# Patient Record
Sex: Female | Born: 1965 | Race: White | Hispanic: No | Smoking: Current every day smoker
Health system: Southern US, Community
[De-identification: ages and names within clinical notes are randomized; demographics above are authoritative.]

## PROBLEM LIST (undated history)

## (undated) DIAGNOSIS — K219 Gastro-esophageal reflux disease without esophagitis: Secondary | ICD-10-CM

## (undated) DIAGNOSIS — E119 Type 2 diabetes mellitus without complications: Secondary | ICD-10-CM

## (undated) DIAGNOSIS — R7303 Prediabetes: Secondary | ICD-10-CM

## (undated) DIAGNOSIS — I10 Essential (primary) hypertension: Secondary | ICD-10-CM

## (undated) DIAGNOSIS — I639 Cerebral infarction, unspecified: Secondary | ICD-10-CM

## (undated) DIAGNOSIS — E785 Hyperlipidemia, unspecified: Secondary | ICD-10-CM

## (undated) DIAGNOSIS — Z86718 Personal history of other venous thrombosis and embolism: Secondary | ICD-10-CM

## (undated) DIAGNOSIS — I739 Peripheral vascular disease, unspecified: Secondary | ICD-10-CM

## (undated) DIAGNOSIS — T7840XA Allergy, unspecified, initial encounter: Secondary | ICD-10-CM

## (undated) DIAGNOSIS — F419 Anxiety disorder, unspecified: Secondary | ICD-10-CM

## (undated) DIAGNOSIS — D649 Anemia, unspecified: Secondary | ICD-10-CM

## (undated) HISTORY — PX: FOOT SURGERY: SHX648

## (undated) HISTORY — DX: Anxiety disorder, unspecified: F41.9

## (undated) HISTORY — DX: Allergy, unspecified, initial encounter: T78.40XA

## (undated) HISTORY — DX: Gastro-esophageal reflux disease without esophagitis: K21.9

## (undated) HISTORY — DX: Peripheral vascular disease, unspecified: I73.9

## (undated) HISTORY — DX: Hyperlipidemia, unspecified: E78.5

## (undated) HISTORY — DX: Essential (primary) hypertension: I10

---

## 1997-11-17 HISTORY — PX: CHOLECYSTECTOMY: SHX55

## 2002-11-17 HISTORY — PX: OOPHORECTOMY: SHX86

## 2002-11-17 HISTORY — PX: ABDOMINAL HYSTERECTOMY: SHX81

## 2003-01-19 ENCOUNTER — Encounter: Admission: RE | Admit: 2003-01-19 | Discharge: 2003-01-19 | Payer: Self-pay | Admitting: Sports Medicine

## 2003-01-19 ENCOUNTER — Encounter: Payer: Self-pay | Admitting: Sports Medicine

## 2003-01-25 ENCOUNTER — Encounter: Payer: Self-pay | Admitting: Sports Medicine

## 2003-01-25 ENCOUNTER — Encounter: Admission: RE | Admit: 2003-01-25 | Discharge: 2003-01-25 | Payer: Self-pay | Admitting: Sports Medicine

## 2006-01-08 ENCOUNTER — Emergency Department: Payer: Self-pay | Admitting: Emergency Medicine

## 2006-01-08 ENCOUNTER — Other Ambulatory Visit: Payer: Self-pay

## 2006-04-02 ENCOUNTER — Ambulatory Visit: Payer: Self-pay | Admitting: Internal Medicine

## 2009-07-08 ENCOUNTER — Emergency Department: Payer: Self-pay | Admitting: Unknown Physician Specialty

## 2011-10-14 ENCOUNTER — Emergency Department: Payer: Self-pay | Admitting: Emergency Medicine

## 2011-11-18 HISTORY — PX: AORTIC ENDARTERECETOMY: SHX5724

## 2012-07-02 ENCOUNTER — Emergency Department: Payer: Self-pay | Admitting: *Deleted

## 2012-07-02 LAB — COMPREHENSIVE METABOLIC PANEL
Albumin: 4.1 g/dL (ref 3.4–5.0)
Alkaline Phosphatase: 112 U/L (ref 50–136)
Anion Gap: 6 — ABNORMAL LOW (ref 7–16)
BUN: 9 mg/dL (ref 7–18)
Chloride: 108 mmol/L — ABNORMAL HIGH (ref 98–107)
EGFR (Non-African Amer.): >60
Glucose: 85 mg/dL (ref 65–99)
Osmolality: 279 (ref 275–301)
Potassium: 3.9 mmol/L (ref 3.5–5.1)
SGOT(AST): 21 U/L (ref 15–37)
Sodium: 141 mmol/L (ref 136–145)
Total Protein: 8.6 g/dL — ABNORMAL HIGH (ref 6.4–8.2)

## 2012-07-02 LAB — CBC
HGB: 15.5 g/dL (ref 12.0–16.0)
MCH: 30.9 pg (ref 26.0–34.0)
MCHC: 33.4 g/dL (ref 32.0–36.0)
RBC: 5.01 10*6/uL (ref 3.80–5.20)
RDW: 13.3 % (ref 11.5–14.5)

## 2012-07-02 LAB — TROPONIN I: Troponin-I: <0.02 ng/mL

## 2012-11-17 DIAGNOSIS — Z86718 Personal history of other venous thrombosis and embolism: Secondary | ICD-10-CM

## 2012-11-17 HISTORY — DX: Personal history of other venous thrombosis and embolism: Z86.718

## 2012-11-21 ENCOUNTER — Emergency Department: Payer: Self-pay | Admitting: Emergency Medicine

## 2013-07-07 ENCOUNTER — Ambulatory Visit: Payer: Self-pay | Admitting: Family Medicine

## 2013-11-17 ENCOUNTER — Emergency Department: Payer: Self-pay | Admitting: Emergency Medicine

## 2013-11-17 LAB — CBC WITH DIFFERENTIAL/PLATELET
BASOS ABS: 0.1 10*3/uL (ref 0.0–0.1)
Basophil %: 1.4 %
EOS ABS: 0.4 10*3/uL (ref 0.0–0.7)
Eosinophil %: 4.3 %
HCT: 42.1 % (ref 35.0–47.0)
HGB: 14.5 g/dL (ref 12.0–16.0)
Lymphocyte #: 2.6 10*3/uL (ref 1.0–3.6)
Lymphocyte %: 32 %
MCH: 31 pg (ref 26.0–34.0)
MCHC: 34.4 g/dL (ref 32.0–36.0)
MCV: 90 fL (ref 80–100)
MONOS PCT: 9.7 %
Monocyte #: 0.8 x10 3/mm (ref 0.2–0.9)
Neutrophil #: 4.3 10*3/uL (ref 1.4–6.5)
Neutrophil %: 52.6 %
Platelet: 363 10*3/uL (ref 150–440)
RBC: 4.66 10*6/uL (ref 3.80–5.20)
RDW: 13.3 % (ref 11.5–14.5)
WBC: 8.2 10*3/uL (ref 3.6–11.0)

## 2013-11-17 LAB — COMPREHENSIVE METABOLIC PANEL
ALK PHOS: 99 U/L
ANION GAP: 4 — AB (ref 7–16)
AST: 23 U/L (ref 15–37)
Albumin: 3.5 g/dL (ref 3.4–5.0)
BILIRUBIN TOTAL: 0.3 mg/dL (ref 0.2–1.0)
BUN: 10 mg/dL (ref 7–18)
CO2: 27 mmol/L (ref 21–32)
CREATININE: 0.85 mg/dL (ref 0.60–1.30)
Calcium, Total: 9.1 mg/dL (ref 8.5–10.1)
Chloride: 107 mmol/L (ref 98–107)
GLUCOSE: 102 mg/dL — AB (ref 65–99)
Osmolality: 275 (ref 275–301)
Potassium: 4.1 mmol/L (ref 3.5–5.1)
SGPT (ALT): 29 U/L (ref 12–78)
SODIUM: 138 mmol/L (ref 136–145)
Total Protein: 7.4 g/dL (ref 6.4–8.2)

## 2013-11-17 LAB — URINALYSIS, COMPLETE
BILIRUBIN, UR: NEGATIVE
Bacteria: NONE SEEN
Blood: NEGATIVE
Glucose,UR: NEGATIVE mg/dL (ref 0–75)
Ketone: NEGATIVE
Leukocyte Esterase: NEGATIVE
NITRITE: NEGATIVE
PROTEIN: NEGATIVE
Ph: 5 (ref 4.5–8.0)
RBC,UR: 1 /HPF (ref 0–5)
Specific Gravity: 1.016 (ref 1.003–1.030)
Squamous Epithelial: 4
WBC UR: 1 /HPF (ref 0–5)

## 2013-11-17 LAB — LIPASE, BLOOD: LIPASE: 282 U/L (ref 73–393)

## 2013-11-17 LAB — TROPONIN I: Troponin-I: <0.02 ng/mL

## 2013-11-23 ENCOUNTER — Ambulatory Visit: Payer: Self-pay | Admitting: Family Medicine

## 2013-12-05 ENCOUNTER — Ambulatory Visit: Payer: Self-pay | Admitting: Gastroenterology

## 2013-12-08 LAB — PATHOLOGY REPORT

## 2014-07-27 ENCOUNTER — Encounter: Payer: Self-pay | Admitting: Podiatry

## 2014-07-27 ENCOUNTER — Ambulatory Visit (INDEPENDENT_AMBULATORY_CARE_PROVIDER_SITE_OTHER): Payer: 59 | Admitting: Podiatry

## 2014-07-27 ENCOUNTER — Ambulatory Visit (INDEPENDENT_AMBULATORY_CARE_PROVIDER_SITE_OTHER): Payer: 59

## 2014-07-27 VITALS — BP 109/64 | HR 86 | Resp 16 | Ht 62.0 in | Wt 178.0 lb

## 2014-07-27 DIAGNOSIS — M722 Plantar fascial fibromatosis: Secondary | ICD-10-CM

## 2014-07-27 MED ORDER — METHYLPREDNISOLONE (PAK) 4 MG PO TABS: ORAL_TABLET | ORAL | Status: DC

## 2014-07-27 MED ORDER — MELOXICAM 15 MG PO TABS: 15.0000 mg | ORAL_TABLET | Freq: Every day | ORAL | Status: DC

## 2014-07-27 NOTE — Patient Instructions (Signed)

## 2014-07-27 NOTE — Progress Notes (Signed)
   Subjective:    Patient ID: Courtney King, female    DOB: 1966/04/22, 48 y.o.   MRN: [id number]  HPI Comments: i have heel pain in both feet. The left one is worse. Ive had it for 2 months. Its getting worse. When i get up in the am it just about brings me down. The only time they dont hurt is when im not up on them. It hurts worse with flat shoes. i have taken tylenol, ice, rolls frozen bottle on feet, and stretches.  Foot Pain      Review of Systems  Hematological: Bruises/bleeds easily.  All other systems reviewed and are negative.      Objective:   Physical Exam: I have reviewed her past medical history medications allergies surgeries social history and review of systems. Pulses are strongly palpable bilateral. Neurologic sensorium is intact per since once the monofilament. Deep tendon reflexes are intact bilateral muscle strength is 5 over 5 dorsiflexors plantar flexors inverters everters all intrinsic musculature is intact. Orthopedic evaluation demonstrates all joints distal to the ankle a full range of motion without crepitation. She has pain on palpation medial continued tubercles bilateral. No pain on medial and lateral compression of the calcaneus. No calf pain. Radiographic evaluation demonstrates plantar distally oriented calcaneal heel spurs with soft tissue increase in density at the plantar fascial calcaneal insertion site.        Assessment & Plan:  Assessment: plantar fasciitis bilateral.  Plan: Injected the bilateral heels today with Kenalog and local anesthetic. Plantar fascial brace is were dispensed. 1 night splint was dispensed. Both oral and written home-going instructions were dispensed. A prescription for Medrol Dosepak to be followed by meloxicam dispensed.

## 2014-08-15 ENCOUNTER — Encounter: Payer: Self-pay | Admitting: Podiatry

## 2014-08-15 ENCOUNTER — Ambulatory Visit (INDEPENDENT_AMBULATORY_CARE_PROVIDER_SITE_OTHER): Payer: 59 | Admitting: Podiatry

## 2014-08-15 VITALS — BP 120/66 | HR 89 | Resp 16

## 2014-08-15 DIAGNOSIS — M722 Plantar fascial fibromatosis: Secondary | ICD-10-CM

## 2014-08-15 MED ORDER — TRIAMCINOLONE ACETONIDE 10 MG/ML IJ SUSP
10.0000 mg | Freq: Once | INTRAMUSCULAR | Status: AC
  Administered 2014-08-15: 10 mg

## 2014-08-15 NOTE — Progress Notes (Signed)
Subjective:     Patient ID: Courtney King, female   DOB: 1966/02/02, 48 y.o.   MRN: [id number]  HPI patient states my heels have started to hurt me quite a bit again and I cannot wait until next week to have injections done   Review of Systems     Objective:   Physical Exam Neurovascular status intact with discomfort in the plantar heel region of both heels upon palpation    Assessment:     Plantar fasciitis with inflammation heel of both feet    Plan:     Injected the plantar fascia of both feet 3 mg Kenalog 5 mg Xylocaine and instructed on physical therapy and supportive shoes and we'll see her physician at this office next week for check

## 2014-08-23 ENCOUNTER — Ambulatory Visit: Payer: 59 | Admitting: Podiatry

## 2014-11-28 ENCOUNTER — Ambulatory Visit (INDEPENDENT_AMBULATORY_CARE_PROVIDER_SITE_OTHER): Payer: 59 | Admitting: Podiatry

## 2014-11-28 ENCOUNTER — Encounter: Payer: Self-pay | Admitting: Podiatry

## 2014-11-28 VITALS — BP 126/77 | HR 96 | Resp 16

## 2014-11-28 DIAGNOSIS — M722 Plantar fascial fibromatosis: Secondary | ICD-10-CM

## 2014-11-28 MED ORDER — DICLOFENAC SODIUM 1 % TD GEL: 2.0000 g | Freq: Two times a day (BID) | TRANSDERMAL | Status: DC

## 2014-11-28 MED ORDER — TRIAMCINOLONE ACETONIDE 10 MG/ML IJ SUSP
10.0000 mg | Freq: Once | INTRAMUSCULAR | Status: AC
  Administered 2014-11-28: 10 mg

## 2014-11-28 NOTE — Patient Instructions (Signed)
Plantar Fasciitis (Heel Spur Syndrome) with Rehab The plantar fascia is a fibrous, ligament-like, soft-tissue structure that spans the bottom of the foot. Plantar fasciitis is a condition that causes pain in the foot due to inflammation of the tissue. SYMPTOMS   Pain and tenderness on the underneath side of the foot.  Pain that worsens with standing or walking. CAUSES  Plantar fasciitis is caused by irritation and injury to the plantar fascia on the underneath side of the foot. Common mechanisms of injury include:  Direct trauma to bottom of the foot.  Damage to a small nerve that runs under the foot where the main fascia attaches to the heel bone.  Stress placed on the plantar fascia due to bone spurs. RISK INCREASES WITH:   Activities that place stress on the plantar fascia (running, jumping, pivoting, or cutting).  Poor strength and flexibility.  Improperly fitted shoes.  Tight calf muscles.  Flat feet.  Failure to warm-up properly before activity.  Obesity. PREVENTION  Warm up and stretch properly before activity.  Allow for adequate recovery between workouts.  Maintain physical fitness:  Strength, flexibility, and endurance.  Cardiovascular fitness.  Maintain a health body weight.  Avoid stress on the plantar fascia.  Wear properly fitted shoes, including arch supports for individuals who have flat feet. PROGNOSIS  If treated properly, then the symptoms of plantar fasciitis usually resolve without surgery. However, occasionally surgery is necessary. RELATED COMPLICATIONS   Recurrent symptoms that may result in a chronic condition.  Problems of the lower back that are caused by compensating for the injury, such as limping.  Pain or weakness of the foot during push-off following surgery.  Chronic inflammation, scarring, and partial or complete fascia tear, occurring more often from repeated injections. TREATMENT  Treatment initially involves the use of  ice and medication to help reduce pain and inflammation. The use of strengthening and stretching exercises may help reduce pain with activity, especially stretches of the Achilles tendon. These exercises may be performed at home or with a therapist. Your caregiver may recommend that you use heel cups of arch supports to help reduce stress on the plantar fascia. Occasionally, corticosteroid injections are given to reduce inflammation. If symptoms persist for greater than 6 months despite non-surgical (conservative), then surgery may be recommended.  MEDICATION   If pain medication is necessary, then nonsteroidal anti-inflammatory medications, such as aspirin and ibuprofen, or other minor pain relievers, such as acetaminophen, are often recommended.  Do not take pain medication within 7 days before surgery.  Prescription pain relievers may be given if deemed necessary by your caregiver. Use only as directed and only as much as you need.  Corticosteroid injections may be given by your caregiver. These injections should be reserved for the most serious cases, because they may only be given a certain number of times. HEAT AND COLD  Cold treatment (icing) relieves pain and reduces inflammation. Cold treatment should be applied for 10 to 15 minutes every 2 to 3 hours for inflammation and pain and immediately after any activity that aggravates your symptoms. Use ice packs or massage the area with a piece of ice (ice massage).  Heat treatment may be used prior to performing the stretching and strengthening activities prescribed by your caregiver, physical therapist, or athletic trainer. Use a heat pack or soak the injury in warm water. SEEK IMMEDIATE MEDICAL CARE IF:  Treatment seems to offer no benefit, or the condition worsens.  Any medications produce adverse side effects. EXERCISES RANGE   OF MOTION (ROM) AND STRETCHING EXERCISES - Plantar Fasciitis (Heel Spur Syndrome) These exercises may help you  when beginning to rehabilitate your injury. Your symptoms may resolve with or without further involvement from your physician, physical therapist or athletic trainer. While completing these exercises, remember:   Restoring tissue flexibility helps normal motion to return to the joints. This allows healthier, less painful movement and activity.  An effective stretch should be held for at least 30 seconds.  A stretch should never be painful. You should only feel a gentle lengthening or release in the stretched tissue. RANGE OF MOTION - Toe Extension, Flexion  Sit with your right / left leg crossed over your opposite knee.  Grasp your toes and gently pull them back toward the top of your foot. You should feel a stretch on the bottom of your toes and/or foot.  Hold this stretch for __________ seconds.  Now, gently pull your toes toward the bottom of your foot. You should feel a stretch on the top of your toes and or foot.  Hold this stretch for __________ seconds. Repeat __________ times. Complete this stretch __________ times per day.  RANGE OF MOTION - Ankle Dorsiflexion, Active Assisted  Remove shoes and sit on a chair that is preferably not on a carpeted surface.  Place right / left foot under knee. Extend your opposite leg for support.  Keeping your heel down, slide your right / left foot back toward the chair until you feel a stretch at your ankle or calf. If you do not feel a stretch, slide your bottom forward to the edge of the chair, while still keeping your heel down.  Hold this stretch for __________ seconds. Repeat __________ times. Complete this stretch __________ times per day.  STRETCH - Gastroc, Standing  Place hands on wall.  Extend right / left leg, keeping the front knee somewhat bent.  Slightly point your toes inward on your back foot.  Keeping your right / left heel on the floor and your knee straight, shift your weight toward the wall, not allowing your back to  arch.  You should feel a gentle stretch in the right / left calf. Hold this position for __________ seconds. Repeat __________ times. Complete this stretch __________ times per day. STRETCH - Soleus, Standing  Place hands on wall.  Extend right / left leg, keeping the other knee somewhat bent.  Slightly point your toes inward on your back foot.  Keep your right / left heel on the floor, bend your back knee, and slightly shift your weight over the back leg so that you feel a gentle stretch deep in your back calf.  Hold this position for __________ seconds. Repeat __________ times. Complete this stretch __________ times per day. STRETCH - Gastrocsoleus, Standing  Note: This exercise can place a lot of stress on your foot and ankle. Please complete this exercise only if specifically instructed by your caregiver.   Place the ball of your right / left foot on a step, keeping your other foot firmly on the same step.  Hold on to the wall or a rail for balance.  Slowly lift your other foot, allowing your body weight to press your heel down over the edge of the step.  You should feel a stretch in your right / left calf.  Hold this position for __________ seconds.  Repeat this exercise with a slight bend in your right / left knee. Repeat __________ times. Complete this stretch __________ times per day.    STRENGTHENING EXERCISES - Plantar Fasciitis (Heel Spur Syndrome)  These exercises may help you when beginning to rehabilitate your injury. They may resolve your symptoms with or without further involvement from your physician, physical therapist or athletic trainer. While completing these exercises, remember:   Muscles can gain both the endurance and the strength needed for everyday activities through controlled exercises.  Complete these exercises as instructed by your physician, physical therapist or athletic trainer. Progress the resistance and repetitions only as guided. STRENGTH -  Towel Curls  Sit in a chair positioned on a non-carpeted surface.  Place your foot on a towel, keeping your heel on the floor.  Pull the towel toward your heel by only curling your toes. Keep your heel on the floor.  If instructed by your physician, physical therapist or athletic trainer, add ____________________ at the end of the towel. Repeat __________ times. Complete this exercise __________ times per day. STRENGTH - Ankle Inversion  Secure one end of a rubber exercise band/tubing to a fixed object (table, pole). Loop the other end around your foot just before your toes.  Place your fists between your knees. This will focus your strengthening at your ankle.  Slowly, pull your big toe up and in, making sure the band/tubing is positioned to resist the entire motion.  Hold this position for __________ seconds.  Have your muscles resist the band/tubing as it slowly pulls your foot back to the starting position. Repeat __________ times. Complete this exercises __________ times per day.  Document Released: 11/03/2005 Document Revised: 01/26/2012 Document Reviewed: 02/15/2009 [employer] Patient Information 2015 [employer]. This information is not intended to replace advice given to you by your health care provider. Make sure you discuss any questions you have with your health care provider.  

## 2014-11-28 NOTE — Progress Notes (Signed)
Patient ID: Courtney King, female   DOB: 12/29/1965, 49 y.o.   MRN: [id number]  Subjective: 49 year old female presents the office today requesting injections to bilateral heels for plantar fasciitis. She states that she previously has had to injection to the area which help. She states that approximately 3 weeks ago she has started to notice increase in symptoms to both of her heels. She states that she has started to use the meloxicam as well as stretching icing the area since the discomfort started. She continues to have mild discomfort overlying the area. She denies any recent injury or trauma to bilateral lower extremity. Also since last appointment she has purchased new balance shoes which help. She has not been using the night splint. Denies any recent swelling or redness around the area. Denies any systemic complaints such as fevers, chills, nausea, vomiting. No acute changes since last appointment, and no other complaints at this time.   Objective: AAO x3, NAD DP/PT pulses palpable bilaterally, CRT less than 3 seconds Protective sensation intact with Simms Weinstein monofilament, vibratory sensation intact, Achilles tendon reflex intact Tenderness palpation overlying the plantar medial tubercle of the calcaneus at the insertion of the plantar fascia bilaterally. There is no pain along the course of the plantar fascial in the arch of the foot in the ligament appears to be intact. No pain along the posterior aspect of the calcaneus or along the course of/insertion of the Achilles tendon. No pain with lateral compression of the calcaneus or pain with vibratory sensation bilaterally.  No areas of pinpoint bony tenderness or pain with vibratory sensation. MMT 5/5, ROM WNL. No edema, erythema, increase in warmth to bilateral lower extremities.  No open lesions or pre-ulcerative lesions.  No pain with calf compression, swelling, warmth, erythema  Assessment: 49 year old female with bilateral heel  pain, reoccurrence of plantar fasciitis.  Plan: -All treatment options discussed with the patient including all alternatives, risks, complications.  -Patient elects to proceed with steroid injection into the bilateral heels. Under sterile skin preparation, a total of 2.5cc of kenalog 10, 0.5% Marcaine plain, and 2% lidocaine plain were infiltrated into the symptomatic area without complication. A band-aid was applied. Patient tolerated the injection well without complication. Post-injection care with discussed with the patient. Discussed with the patient to ice the area over the next couple of days to help prevent a steroid flare.  -Dispensed plantar fascial braces as she has worn the other ones out from washing them. -Continue stretching exercises as well as icing. -Continue meloxicam. Discussed side effects the medication and directed to stop if any are to occur. She is not having problems so far. -Follow-up in 4 weeks or sooner should any problems arise. In the meantime encouraged to call the office with any questions, concerns, changes symptoms

## 2014-12-28 ENCOUNTER — Ambulatory Visit: Payer: 59 | Admitting: Podiatry

## 2015-01-04 ENCOUNTER — Ambulatory Visit (INDEPENDENT_AMBULATORY_CARE_PROVIDER_SITE_OTHER): Payer: 59 | Admitting: Podiatry

## 2015-01-04 ENCOUNTER — Encounter: Payer: Self-pay | Admitting: Podiatry

## 2015-01-04 VITALS — BP 152/82 | HR 96 | Resp 18

## 2015-01-04 DIAGNOSIS — M722 Plantar fascial fibromatosis: Secondary | ICD-10-CM

## 2015-01-04 NOTE — Patient Instructions (Signed)
Venous Thromboembolism, Prevention A venous thromboembolism is a blood clot that forms in a vein. A blood clot in a deep vein is called a deep venous thrombosis (DVT). A blood clot in the lungs is called a pulmonary embolism (PE). Blood clots are dangerous and can cause death. Blood clots can form in the:  Lungs.  Legs.  Arms. CAUSES  A blood clot can form in a vein from different conditions. A blood clot can develop due to:  Blood flow within a vein that is sluggish or very slow.  Medical conditions that make the blood clot easily.  Vein damage. RISK FACTORS Risk factors can increase your risk of developing a blood clot. Risk factors can include:  Smoking.  Obesity.  Age.  Immobility or sedentary lifestyle.  Sitting or standing for long periods of time.  Chronic or long-term bedrest.  Medical or past history of blood clots.  Family history of blood clots.  Hip, leg, or pelvis injury or trauma.  Major surgery, especially surgery on the hip, knee, or abdomen.  Pregnancy and childbirth.  Birth control pills and hormone replacement therapy.  Medical conditions such as  Peripheral vascular disease (PVD).  Diabetes.  Cancer. SYMPTOMS  Symptoms of VTE can depend on where the clot is located and if the clot breaks off and travels to another organ. Sometimes, there may be no symptoms.   DVT symptoms can include:  Swelling of the leg or arm, especially on one side.  Warmth and redness of the leg or arm, especially on one side.  Pain in an arm or leg. Leg pain may be more noticeable or worse when standing or walking.  PE symptoms can include:  Shortness of breath.  Coughing.  Coughing up blood or blood-tinged mucus (hemoptysis).  Chest pain or chest pain with deep breaths (pleuritic chest pain).  Apprehension, anxiety, or a feeling of impending doom.  Rapid heartbeat. PREVENTION  Exercise regularly. Take a brisk 30 minute walk every day. Staying  active and moving around can help prevent blood clots.  Avoid sitting or lying in bed for long periods of time. Change your position often, especially during a long trip.  Women, especially those over the age of 107, should consider the risks and benefits of taking estrogen medicines. This includes birth control pills and hormone replacement therapy.  Do not smoke, especially if you take estrogen medicines. If you smoke, talk to your caregiver on how to quit.  Eat plenty of fruits and vegetables. Ask your caregiver or dietitian if there are foods you should avoid.  Maintain a weight as suggested by your caregiver.  Wear loose-fitting clothing. Avoid constrictive or tight clothing around your legs or waist.  Try not to bump or injure your legs. Avoid crossing your legs when you are sitting.  Do not use pillows under your knees unless told by your caregiver.  Take all medicines that your caregiver prescribes you.  Wear special stockings (compression stockings or TED hose) if your caregiver prescribes them.  Wearing compression stockings (support hose) can make the leg veins more narrow. This increases blood flow in the legs and can help prevent blood clots.  It is important to wear compression stockings correctly. Do not let them bunch up when you are wearing them. TRAVEL Long distance travel can increase the risk of a blood clot. To prevent a blood clot when traveling:  You should exercise your legs by walking or by pumping your muscles every hour. To help prevent poor  circulation on long trips, stand, stretch, and walk up and down the aisle of your airplane, train, or bus as often as possible to get the blood moving.  Do squats if you are able. If you are unable to do squats, raise your foot on the balls of your feet and tighten your lower leg muscles (particularly the calve muscles) while seated. Pointing (flexing and extending) your toes while tightening your calves while seated are  also good exercises to do every hour during long trips. They help increase blood flow and reduce risk of DVT.  Stay well hydrated. Drink water regularly when traveling, especially when you are sitting or immobile for long periods of time.  Use of drugs to prevent DVT during routine travel is not generally recommended. Before taking any drugs to reduce risk of DVT, consult your caregiver. SURGERY AND HOSPITALIZATION  People who are at high risk for a blood clot may be given a blood thinning medicine (anticoagulant) when they are hospitalized even if they are not going to have surgery.  A long trip prior to surgery can increase the risk of a clot for patients undergoing hip and knee replacements. Talk to your caregiver about travel plans before your surgery.  After hip or knee surgery, your caregiver may give you anticoagulants to help prevent blood clots.  Anticoagulants may be given to people at high risk of developing thromboembolism, before, during, or sometimes after surgery, including people with clotting disorders or with a history of past thromboembolism. TRAVEL AFTER SURGERY  In orthopedic surgery, the cutting of bones prompts the body to increase clotting factors in the blood. Due to the size of the bones involved in hip and knee replacements, there is a higher risk of blood clotting than other orthopedic surgeries.  There is a risk of clotting for up to 4-6 weeks after surgery. Flying or traveling long distances can increase your risk of a clot. As a result, those who travel long distances may need additional preventive measures after their procedure.  Drink only non-alcoholic beverages during your flight, train, or car travel. Alcohol can dehydrate you and increase your risk of getting blood clots. SEEK IMMEDIATE MEDICAL CARE IF:   You develop chest pain.  You develop severe shortness of breath.  You have breathing problems after traveling.  You develop swelling or pain in the  leg.  You begin to cough up bloody mucus or phlegm (sputum).  You feel dizzy or faint. Document Released: 10/22/2009 Document Revised: 07/28/2012 Document Reviewed: 10/22/2009 [facility] Patient Information 2015 [place], [place]. This information is not intended to replace advice given to you by your health care provider. Make sure you discuss any questions you have with your health care provider.

## 2015-01-07 NOTE — Progress Notes (Signed)
Patient ID: Courtney King, female   DOB: 11-23-1965, 49 y.o.   MRN: [id number]  Subjective: 49 year old female returns the office today with complaints of bilateral heel pain with a left greater than right. She states that she has previously had multiple injections into the area which seem to help alleviate the symptoms however she continues to have pain to her heels. She states that she was talking to her friend who was placed in a hard cast for 6 weeks which relieved her symptoms and she is requesting this at this time. She states she has continue the stretching and icing exercises. No other complaints at this time in no acute changes since last appointment. Denies any systemic complaints as fevers, chills, nausea, vomiting.  Objective: AAO x3, NAD DP/PT pulses palpable bilaterally, CRT less than 3 seconds Protective sensation intact with Simms Weinstein monofilament, vibratory sensation intact, Achilles tendon reflex intact Tenderness to palpation overlying the plantar medial tubercle of the calcaneus to bilateral heels at the insertion of the plantar fascia with the left > right. There is no pain along the course of plantar fascial within the arch of the foot. The plantar fascia does appear to be intact. There is no pain with lateral compression of the calcaneus or pain the vibratory sensation. No pain on the posterior aspect of the calcaneus or along the course/insertion of the Achilles tendon. There is no overlying edema, erythema, increase in warmth. No other areas of tenderness palpation or pain with vibratory sensation to the foot/ankle bilaterally. MMT 5/5, ROM WNL No open lesions or pre-ulcerative lesions are identified. No pain with calf compression, swelling, warmth, erythema.  Assessment: 49 year old female with continued bilateral heel pain left greater than right.  Plan: -Treatment options were discussed with the patient clean alternatives, risks, complications. -At this time the  patient is requesting a fiberglass cast nonweightbearing for 6 weeks. I discussed with the patient that although this is an option for plantar fasciitis given her history of blood clot as well as smoking she is a high risk for developing DVT/PE. If she wanted to pursue this treatment she would need to be on DVT prophylaxis throughout the duration. I discussed with her a CAM boot in order for her to continue to walk as well as remove the boot to exercise/massage the area to help prevent blood clots. She is agreeable to this treatment. The CAM boot was discussed. I discussed with her great length signs and symptoms of DVT/PE. Any are to occur to go directly to the emergency room. -Continue with anti-inflammatory medication and ice. -Follow-up in 4 weeks or sooner if any problems are to arise. In the meantime, GERD to call the office with any questions, concerns, change in symptoms.

## 2015-02-01 ENCOUNTER — Encounter: Payer: Self-pay | Admitting: Podiatry

## 2015-02-01 ENCOUNTER — Ambulatory Visit (INDEPENDENT_AMBULATORY_CARE_PROVIDER_SITE_OTHER): Payer: Commercial Managed Care - PPO | Admitting: Podiatry

## 2015-02-01 VITALS — BP 114/62 | HR 93 | Resp 16

## 2015-02-01 DIAGNOSIS — M722 Plantar fascial fibromatosis: Secondary | ICD-10-CM | POA: Diagnosis not present

## 2015-02-01 NOTE — Progress Notes (Signed)
   Subjective:    Patient ID: Courtney King, female    DOB: 1966-11-10, 49 y.o.   MRN: [id number]  HPI 38 female presents the occipital evaluation of bilateral heel pain. She states of the right side is doing well and she has no pain to the right side. She states that she occasionally has some intermittent pain to the left heel although she states that stretching and icing alleviate some symptoms. She continues to wear the CAM walker intermittently however she does not wear it at work. She has been wearing a more supportive sneaker at work which seems to help alleviate his symptoms. No other complaints at this time. No acute changes since last appointment.   Review of Systems  All other systems reviewed and are negative.      Objective:   Physical Exam AAO x3, NAD DP/PT pulses palpable bilaterally, CRT less than 3 seconds Protective sensation intact with Simms Weinstein monofilament, vibratory sensation intact, Achilles tendon reflex intact Mild tenderness to palpation overlying the plantar medial tubercle of the calcaneus to right heel at the insertion of the plantar fascia. There is no pain along the course of plantar fascial within the arch of the foot bilaterally. There is no pain overlying the right heel insertion of the plantar fascia. There is no pain with lateral compression of the calcaneus or pain the vibratory sensation. No pain on the posterior aspect of the calcaneus or along the course/insertion of the Achilles tendon. There is no overlying edema, erythema, increase in warmth. No other areas of tenderness palpation or pain with vibratory sensation to the foot/ankle. MMT 5/5, ROM WNL No open lesions or pre-ulcerative lesions are identified. No pain with calf compression, swelling, warmth, erythema.       Assessment & Plan:  49 year old female with resolving right heel pain with intermittent left heel pain although resolving. -Treatment options were discussed including  alternatives, risks, complications. -At this time discussed to continue stretching, night splint, ice, anti-inflammatories. I discussed with her possible physical therapy however since she is doing well. I she wishes to continue with home treatment. Discussed with her that if the pain increases or if there is any further symptoms can reevaluate as needed. -A no was provided for her to wear supportive shoes while working. -Follow-up as needed. In the meantime encouraged to call the office with any questions, concerns, change in symptoms.

## 2015-03-07 ENCOUNTER — Telehealth: Payer: Self-pay | Admitting: *Deleted

## 2015-03-07 NOTE — Telephone Encounter (Signed)
Yes

## 2015-03-07 NOTE — Telephone Encounter (Signed)
Patient was given a air fracture walker , wants to know if she can have the short boot instead

## 2015-03-21 ENCOUNTER — Ambulatory Visit (INDEPENDENT_AMBULATORY_CARE_PROVIDER_SITE_OTHER): Payer: 59 | Admitting: Podiatry

## 2015-03-21 ENCOUNTER — Encounter: Payer: Self-pay | Admitting: Podiatry

## 2015-03-21 DIAGNOSIS — M722 Plantar fascial fibromatosis: Secondary | ICD-10-CM | POA: Diagnosis not present

## 2015-03-21 MED ORDER — TRAMADOL HCL 50 MG PO TABS: 50.0000 mg | ORAL_TABLET | Freq: Three times a day (TID) | ORAL | Status: DC | PRN

## 2015-03-21 NOTE — Patient Instructions (Signed)
Pre-Operative Instructions  Congratulations, you have decided to take an important step to improving your quality of life.  You can be assured that the doctors of [facility] will be with you every step of the way.  1. Plan to be at the surgery center/hospital at least 1 (one) hour prior to your scheduled time unless otherwise directed by the surgical center/hospital staff.  You must have a responsible adult accompany you, remain during the surgery and drive you home.  Make sure you have directions to the surgical center/hospital and know how to get there on time. 2. For hospital based surgery you will need to obtain a history and physical form from your family physician within 1 month prior to the date of surgery- we will give you a form for you primary physician.  3. We make every effort to accommodate the date you request for surgery.  There are however, times where surgery dates or times have to be moved.  We will contact you as soon as possible if a change in schedule is required.   4. No Aspirin/Ibuprofen for one week before surgery.  If you are on aspirin, any non-steroidal anti-inflammatory medications (Mobic, Aleve, Ibuprofen) you should stop taking it 7 days prior to your surgery.  You make take Tylenol  For pain prior to surgery.  5. Medications- If you are taking daily heart and blood pressure medications, seizure, reflux, allergy, asthma, anxiety, pain or diabetes medications, make sure the surgery center/hospital is aware before the day of surgery so they may notify you which medications to take or avoid the day of surgery. 6. No food or drink after midnight the night before surgery unless directed otherwise by surgical center/hospital staff. 7. No alcoholic beverages 24 hours prior to surgery.  No smoking 24 hours prior to or 24 hours after surgery. 8. Wear loose pants or shorts- loose enough to fit over bandages, boots, and casts. 9. No slip on shoes, sneakers are best. 10. Bring  your boot with you to the surgery center/hospital.  Also bring crutches or a walker if your physician has prescribed it for you.  If you do not have this equipment, it will be provided for you after surgery. 11. If you have not been contracted by the surgery center/hospital by the day before your surgery, call to confirm the date and time of your surgery. 12. Leave-time from work may vary depending on the type of surgery you have.  Appropriate arrangements should be made prior to surgery with your employer. 13. Prescriptions will be provided immediately following surgery by your doctor.  Have these filled as soon as possible after surgery and take the medication as directed. 14. Remove nail polish on the operative foot. 15. Wash the night before surgery.  The night before surgery wash the foot and leg well with the antibacterial soap provided and water paying special attention to beneath the toenails and in between the toes.  Rinse thoroughly with water and dry well with a towel.  Perform this wash unless told not to do so by your physician.  Enclosed: 1 Ice pack (please put in freezer the night before surgery)   1 Hibiclens skin cleaner   Pre-op Instructions  If you have any questions regarding the instructions, do not hesitate to call our office.  [place]: [address] [place], [place] [address] [phone]  [place]: [address] [place], [place] [address] [phone]  [place]: [address]  [place], [place] [address] [phone]  Dr. Richard   Tuchman DPM, Dr. Ila Mcgill DPM Dr. Harriet Masson DPM, Dr. Lanelle Bal DPM, Dr. Trudie Buckler DPM, Dr. Celesta Gentile, DPM

## 2015-03-22 ENCOUNTER — Ambulatory Visit: Payer: Commercial Managed Care - PPO | Admitting: Podiatry

## 2015-03-22 NOTE — Progress Notes (Signed)
Patient ID: Courtney King, female   DOB: [birth year], 49 y.o.   MRN: [id number]  Subjective: 49 year old female presents the office today for follow-up evaluation of bilateral heel pain. She states that her heels have been becoming more painful over the last 2 weeks. She says the left is worse than the right. At this time she has had multiple steroid injections, immobilization and she is continuing stretching, icing activities. At this time she is requesting a further treatment recommended performed for more definitive pain relief as is his been ongoing for greater than 1 year. She states the pain started mostly in the morning or after periods of activity which is relieved by ambulation was become more consistent. She denies any numbness or tingling. Denies any redness or swelling. Denies any acute changes since last appointment no other complaints at this time.   Objective: AAO x3, NAD DP/PT pulses palpable bilaterally, CRT less than 3 seconds Protective sensation intact with Simms Weinstein monofilament, vibratory sensation intact, Achilles tendon reflex intact, negative tinel sign bilaterally.  There is continued tenderness to palpation overlying the plantar medial tubercle of the calcaneus to bilateral heels at the insertion of the plantar fascia with the left > right. There is no pain along the course of plantar fascial within the arch of the foot and the plantar fascia appears intact. There is no pain with lateral compression of the calcaneus or pain the vibratory sensation. No pain on the posterior aspect of the calcaneus or along the course/insertion of the Achilles tendon. There is no overlying edema, erythema, increase in warmth. No other areas of tenderness palpation or pain with vibratory sensation to the foot/ankle. MMT 5/5, ROM WNL No open lesions or pre-ulcerative lesions are identified. No pain with calf compression, swelling, warmth, erythema.  Assessment: 49 year old female with  continued chronic heel pain bilaterally, plantar fasciitis of left greater than right  Plan: -Treatment options were discussed with the patient both conservative and surgical including alternatives, risks, competitions. At this time as she is attended conservative treatment I discussed with her EPAT and surgical interventions as well as other conservative treatment. At this time she elects to proceed with surgical intervention.  -The proposed surgery is left EPF vs. Open plantar fasciectomy  -The incision placement as well as the postoperative course was discussed with the patient. I discussed risks of the surgery which include, but not limited to, infection, bleeding, pain, swelling, need for further surgery, delayed or nonhealing, painful or ugly scar, numbness or sensation changes, over/under correction, recurrence, transfer lesions, further deformity, hardware failure, DVT/PE, loss of toe/foot. Patient understands these risks and wishes to proceed with surgery. The surgical consent was reviewed with the patient all 3 pages were signed. No promises or guarantees were given to the outcome of the procedure. All questions were answered to the best of my ability. Before the surgery the patient was encouraged to call the office if there is any further questions. The surgery will be performed at the [facility] on an outpatient basis.

## 2015-03-27 ENCOUNTER — Telehealth: Payer: Self-pay | Admitting: *Deleted

## 2015-03-27 NOTE — Telephone Encounter (Addendum)
I called to see if patient could reschedule her surgery to Thursday instead of Friday.  "That's fine but you know my FMLA date will need to be changed right?"  We will take care of that, no problem.  "What time will it be?"  I'm not sure but the surgical center will give you a call.  "Someone is going to call me with the time?"  Yes, someone will call you with the time.  I called and rescheduled surgery at [facility].  I called patient back because she had called inquiring about arrival time.  I informed her that she needs to tentatively be there at 6:45am.  They will call with a definite time.  "Okay, the anesthesiologist hasn't called me yet."  They normally call a day or 2 prior to surgery.  "Okay, thank you."  No, thank you.

## 2015-03-28 NOTE — Telephone Encounter (Signed)
Paperwork redone and resubmitted

## 2015-03-29 DIAGNOSIS — M722 Plantar fascial fibromatosis: Secondary | ICD-10-CM | POA: Diagnosis not present

## 2015-04-03 ENCOUNTER — Encounter: Payer: Self-pay | Admitting: Podiatry

## 2015-04-03 NOTE — Progress Notes (Signed)
epf left foot

## 2015-04-03 NOTE — Progress Notes (Signed)
   Subjective:    Patient ID: Courtney King, female    DOB: Oct 20, 1966, 49 y.o.   MRN: [id number]  HPI .    Review of Systems     Objective:   Physical Exam        Assessment & Plan:

## 2015-04-05 ENCOUNTER — Ambulatory Visit (INDEPENDENT_AMBULATORY_CARE_PROVIDER_SITE_OTHER): Payer: 59 | Admitting: Podiatry

## 2015-04-05 ENCOUNTER — Encounter: Payer: Self-pay | Admitting: Podiatry

## 2015-04-05 VITALS — BP 115/67 | HR 93 | Resp 16

## 2015-04-05 DIAGNOSIS — Z9889 Other specified postprocedural states: Secondary | ICD-10-CM

## 2015-04-05 DIAGNOSIS — M722 Plantar fascial fibromatosis: Secondary | ICD-10-CM

## 2015-04-05 NOTE — Progress Notes (Signed)
Patient ID: Courtney King, female   DOB: [birth year], 49 y.o.   MRN: [id number]  DOS: 03-30-15 L EPF  Subjective: 49 year old female presents the office today 1 week status post left foot AP F. She states that she does get some swelling to the area. She did try to go back to work and she had increased swelling which is resulting in some pain. She states that as the swelling increases she has discomfort. I 66 her foot elevated she does not have any pain. She's been taking the pain medication intermittently as needed. She denies any systemic complaints as fevers, chills, nausea, vomiting.  Denies any calf pain, chest pain, shortness of breath.She continues in the CAM walker. She does use her knee scooter at times. No other complaints at this time in no acute changes since last appointment.  Objective: AAO x 3, NAD DP/PT pulses palpable b/l, CRT < 3 sec  protective sensation intact with Simms Weinstein monofilament  Incisions of both the medial and lateral off of the heel well coapted without any evidence of dehiscence and sutures are intact. There is no swelling erythema, ascending cellulitis, drainage, malodor , or any other clinical signs of infection.  There is mild localized edema around the incision sites there is no significant edema to the foot/ankle/leg.There is no tenderness to palpation on the plantar aspect of the heel on the insertion of the plantar fascia. No other areas of pinpoint bony tenderness or pain the vibratory sensation of bilateral lower kidneys. No other open lesions or pre-ulcerative lesions identified. No pain with calf compression, swelling, warmth, erythema.  Assessment: 49 year old female 1 week s/p left EPF  Plan: -Teatment options were discussed including alternatives, risks, competitions -Dressings were removed. Antibiotic ointment was placed over the incisions followed by dry sterile dressing. Recommend he keep his dressing clean, dry, intact. -Continue with Cam  Walker. -Pain medication as needed -Ice and elevation -Follow-up in one week for likely suture removal or sooner if any problems are to arise. In the meantime, call the office with any questions, concerns, change in symptoms. Monitoring clinical signs or symptoms of infection and her DVT/PE and directed  To call the office immediately or go directly to the emergency room.

## 2015-04-06 ENCOUNTER — Encounter: Payer: 59 | Admitting: Podiatry

## 2015-04-06 ENCOUNTER — Encounter: Payer: Self-pay | Admitting: *Deleted

## 2015-04-06 NOTE — Progress Notes (Signed)
Faxed note for work to [phone] at patients request.

## 2015-04-06 NOTE — Progress Notes (Signed)
Faxed over work note at patient's request.

## 2015-04-10 ENCOUNTER — Telehealth: Payer: Self-pay | Admitting: *Deleted

## 2015-04-10 NOTE — Telephone Encounter (Signed)
CALLED PATIENT TO CHECK ON HER AFTER HER CALL TO DR Jacqualyn Posey LAST NIGHT ABOUT HER FOOT , HE HAD RECOMMENDED HER TO GO TO ER, PATIENT STATED THAT SHE DID NOT GO TO ER , THAT SHE PUT SOME WARM TOWELS ON HER FOOT AND IT WAS FINE AFTER THAT, SHE STATED SHE IS DOING WELL AND WILL JUST KEEP HER APPOINTMENT WITH [place] ON Thursday. SHE WILL CALL us IF SHE NEEDS [place]

## 2015-04-12 ENCOUNTER — Ambulatory Visit (INDEPENDENT_AMBULATORY_CARE_PROVIDER_SITE_OTHER): Payer: 59 | Admitting: Podiatry

## 2015-04-12 DIAGNOSIS — M722 Plantar fascial fibromatosis: Secondary | ICD-10-CM

## 2015-04-12 DIAGNOSIS — Z9889 Other specified postprocedural states: Secondary | ICD-10-CM

## 2015-04-16 NOTE — Progress Notes (Signed)
Patient ID: ALBIE BAZIN, female   DOB: 09/15/1966, 49 y.o.   MRN: [id number]  DOS: 03-30-15 L EPF  Subjective: [name] presents the office today 2 weeks status post left foot EPF. She does state that overall she's had some slight increase in pain this week and she has returned back to work. She also states that she does not wear the boot all the time and she does walk around the house barefoot. She did call earlier in the week stating that her foot with ice cold I encouraged her to go to the emergency room. She states that she do not in the emergency room she put a warm blanket on her foot which revealed the symptoms. At that time she had denied any color changes of the toe or any significant increase in pain. She denies any systemic complaints as fevers, chills, nausea, vomiting.  Denies any calf pain, chest pain, shortness of breath. No other complaints at this time in no acute changes since last appointment.  Objective: AAO x 3, NAD DP/PT pulses palpable b/l, CRT < 3 sec; normal color and temperature to bilateral lower extremities.  Protective sensation intact with Derrel Nip monofilament Incisions of both the medial and lateral aspect of the heel are all well coapted without any evidence of dehiscence and sutures are intact. There is no surrounding erythema, ascending cellulitis, drainage, malodor, or any other clinical signs of infection.  There is no edema around the incision sites. There is no tenderness to palpation on the plantar aspect of the heel on the insertion of the plantar fascia. No other areas of pinpoint bony tenderness or pain the vibratory sensation of bilateral lower extremities. No other open lesions or pre-ulcerative lesions identified. No pain with calf compression, swelling, warmth, erythema.  Assessment: 49 year old female 2 weeks s/p left EPF  Plan: -Teatment options were discussed including alternatives, risks, competitions -Dressings were removed. Sutures  were removed without complications.  Antibiotic ointment was placed over the incisions followed by dry sterile dressing. Recommend he keep his dressing clean, dry, intact. She can remove the dressing in 24 hours and start to shower unless there is a problem with the incision.  -Can start to transition to a regular, supportive sneaker. She MUST WEAR A SHOE AT ALL TIMES AND NOT GO BAREFOOT. Also, must wear either the night splint or CAM walker at night, every night.  -Hold off on any high impact activities/exercises.  -Pain medication as needed -Ice and elevation -Follow-up in 1 week (per patients request to be released to go back to work)  or sooner if any problems are to arise. In the meantime, call the office with any questions, concerns, change in symptoms. Monitoring clinical signs or symptoms of infection and her DVT/PE and directed to call the office immediately or go directly to the emergency room.

## 2015-04-19 ENCOUNTER — Encounter: Payer: Self-pay | Admitting: Podiatry

## 2015-04-19 ENCOUNTER — Ambulatory Visit (INDEPENDENT_AMBULATORY_CARE_PROVIDER_SITE_OTHER): Payer: 59 | Admitting: Podiatry

## 2015-04-19 DIAGNOSIS — M722 Plantar fascial fibromatosis: Secondary | ICD-10-CM

## 2015-04-19 DIAGNOSIS — Z9889 Other specified postprocedural states: Secondary | ICD-10-CM

## 2015-04-20 NOTE — Progress Notes (Signed)
Patient ID: Courtney King, female   DOB: [birth year], 49 y.o.   MRN: [id number]  DOS: 03-30-15 L EPF  Subjective: [name] presents the office today 3 weeks status post left foot EPF. She states that overall she was doing much better until yesterday when she mowed the grass she started developing increased pain. She does that she is able to angulate a regular shoe without much difficulty. She states that she does not have pain to reveal that she did prior to surgery. She gets some tenderness around the incisions however not to the bottom of her heel. She states that she's had no prompt the incision denies any drainage or redness. No other complaints at this time. She denies any systemic complaints as fevers, chills, nausea, vomiting.  Denies any calf pain, chest pain, shortness of breath. No other complaints at this time in no acute changes since last appointment.  Objective: AAO x 3, NAD DP/PT pulses palpable b/l, CRT < 3 sec Protective sensation intact with Simms Weinstein monofilament Incisions of both the medial and lateral aspect of the heel are all well coapted without any evidence of dehiscence. There is no surrounding erythema, ascending cellulitis, drainage, malodor, or any other clinical signs of infection.  There is no edema around the incision sites however there is mild tenderness over the medial incision. There is no tenderness to palpation on the plantar aspect of the heel on the insertion of the plantar fascia. No other areas of pinpoint bony tenderness or pain the vibratory sensation of bilateral lower extremities. No other open lesions or pre-ulcerative lesions identified. No pain with calf compression, swelling, warmth, erythema.  Assessment: 49 year old female 3 weeks s/p left EPF  Plan: -Teatment options were discussed including alternatives, risks, competitions -She can continue with finger shoe gear as tolerated. Again discussed that she must wear shoe at all times. Also  discussed that she needs to continue to wear the night splint at night every night for the next month. -Continue to hold off on any high-impact exercises or any aggressive activities such as mowing the grass. She understands this however due to personal reasons over the last week she's had increase activity. -Ice and elevation -At this time she can go back to work, however she still needs to limit her activity as much as possible. She can gradually increase activity as tolerated.  -Continue to monitor for any clinical signs or symptoms of infection and her DVT/PE and directed to call the office immediately or go directly to the emergency room. -Follow-up in 4 weeks, or sooner should any problems arise. In the meantime, encouraged to call with any questions/concerns/change in symptoms.

## 2015-05-17 ENCOUNTER — Encounter: Payer: 59 | Admitting: Podiatry

## 2015-05-29 ENCOUNTER — Other Ambulatory Visit: Payer: Self-pay | Admitting: Family Medicine

## 2015-05-29 NOTE — Telephone Encounter (Signed)
Patient is requesting a refill of Doxycycline she says her face has broken out and would like it sent to [facility].

## 2015-05-29 NOTE — Telephone Encounter (Signed)
States her face has broken out and is requesting refill on Doxycycline. Please send to [place] [facility].

## 2015-06-07 ENCOUNTER — Other Ambulatory Visit: Payer: Self-pay | Admitting: Family Medicine

## 2015-06-07 DIAGNOSIS — K219 Gastro-esophageal reflux disease without esophagitis: Secondary | ICD-10-CM

## 2015-06-07 DIAGNOSIS — I10 Essential (primary) hypertension: Secondary | ICD-10-CM

## 2015-07-17 ENCOUNTER — Ambulatory Visit (INDEPENDENT_AMBULATORY_CARE_PROVIDER_SITE_OTHER): Payer: 59 | Admitting: Family Medicine

## 2015-07-17 ENCOUNTER — Encounter: Payer: Self-pay | Admitting: Family Medicine

## 2015-07-17 VITALS — BP 122/66 | HR 103 | Temp 98.7°F | Resp 19 | Ht 61.0 in | Wt 193.6 lb

## 2015-07-17 DIAGNOSIS — Z6829 Body mass index (BMI) 29.0-29.9, adult: Secondary | ICD-10-CM | POA: Insufficient documentation

## 2015-07-17 DIAGNOSIS — K219 Gastro-esophageal reflux disease without esophagitis: Secondary | ICD-10-CM | POA: Insufficient documentation

## 2015-07-17 DIAGNOSIS — N951 Menopausal and female climacteric states: Secondary | ICD-10-CM | POA: Insufficient documentation

## 2015-07-17 DIAGNOSIS — F419 Anxiety disorder, unspecified: Secondary | ICD-10-CM | POA: Insufficient documentation

## 2015-07-17 DIAGNOSIS — E782 Mixed hyperlipidemia: Secondary | ICD-10-CM | POA: Diagnosis not present

## 2015-07-17 DIAGNOSIS — E785 Hyperlipidemia, unspecified: Secondary | ICD-10-CM | POA: Insufficient documentation

## 2015-07-17 DIAGNOSIS — IMO0001 Reserved for inherently not codable concepts without codable children: Secondary | ICD-10-CM

## 2015-07-17 MED ORDER — ICOSAPENT ETHYL 1 G PO CAPS: 2.0000 | ORAL_CAPSULE | Freq: Two times a day (BID) | ORAL | Status: DC

## 2015-07-17 NOTE — Progress Notes (Signed)
Name: Courtney King   MRN: [id number]    DOB: Jun 07, 1966   Date:07/17/2015       Progress Note  Subjective  Chief Complaint  Chief Complaint  Patient presents with  . Follow-up    BP  . Advice Only    Discuss labs for health screening  . Hyperlipidemia  . Gastrophageal Reflux  . Hypertension    Hyperlipidemia This is a new problem. Recent lipid tests were reviewed and are high. Exacerbating diseases include obesity. She has no history of hypothyroidism or liver disease. Pertinent negatives include no chest pain or shortness of breath. She is currently on no antihyperlipidemic treatment (She has taken Crestor in the past, which was stopped due to side effects).   Obesity Pt. Is here to discuss alternative to BMI wellness goals requested by her employer ([employer]) in order to have the discounted rate of insurance. She currently weighs at 193 lbs, with a BMI of 36.58. She has gained weight since she quit smoking on June 23 rd, 2016. She frequently eats fast foods during the week but cooks on the weekends.   Past Medical History  Diagnosis Date  . Allergy   . Hyperlipidemia   . Hypertension   . GERD (gastroesophageal reflux disease)   . Anxiety     Past Surgical History  Procedure Laterality Date  . Oophorectomy Bilateral 2004  . Cholecystectomy  1999  . Abdominal hysterectomy  2004    Family History  Problem Relation Age of Onset  . Cancer Mother     Social History   Social History  . Marital Status: Married    Spouse Name: N/A  . Number of Children: N/A  . Years of Education: N/A   Occupational History  . Not on file.   Social History Main Topics  . Smoking status: Former Research scientist (life sciences)  . Smokeless tobacco: Never Used  . Alcohol Use: 0.0 oz/week    0 Standard drinks or equivalent per week  . Drug Use: No  . Sexual Activity: Not on file   Other Topics Concern  . Not on file   Social History Narrative     Current outpatient prescriptions:  .  amoxicillin  (AMOXIL) 500 MG capsule, , Disp: , Rfl: 0 .  aspirin 81 MG tablet, Take by mouth., Disp: , Rfl:  .  aspirin EC 81 MG tablet, Take by mouth., Disp: , Rfl:  .  benzonatate (TESSALON) 100 MG capsule, Take by mouth., Disp: , Rfl:  .  clonazePAM (KLONOPIN) 0.5 MG tablet, Take by mouth., Disp: , Rfl:  .  clonazePAM (KLONOPIN) 0.5 MG tablet, Take by mouth., Disp: , Rfl:  .  diclofenac sodium (VOLTAREN) 1 % GEL, Apply 2 g topically 2 (two) times daily. Rub into affected area of foot 2 to 4 times daily, Disp: 100 g, Rfl: 2 .  diclofenac sodium (VOLTAREN) 1 % GEL, Apply topically., Disp: , Rfl:  .  lisinopril (PRINIVIL,ZESTRIL) 20 MG tablet, Take 1 tablet (20 mg total) by mouth daily., Disp: 90 tablet, Rfl: 0 .  meloxicam (MOBIC) 15 MG tablet, Take 1 tablet (15 mg total) by mouth daily., Disp: 30 tablet, Rfl: 3 .  methylPREDNIsolone (MEDROL DOSPACK) 4 MG tablet, follow package directions, Disp: 21 tablet, Rfl: 0 .  Omega-3 Fatty Acids (OMEGA 3 PO), Take by mouth daily., Disp: , Rfl:  .  oxyCODONE-acetaminophen (PERCOCET/ROXICET) 5-325 MG per tablet, Take by mouth., Disp: , Rfl:  .  pantoprazole (PROTONIX) 40 MG tablet, Take 1 tablet (  40 mg total) by mouth daily., Disp: 90 tablet, Rfl: 0 .  RABEprazole (ACIPHEX) 20 MG tablet, Take by mouth., Disp: , Rfl:  .  sucralfate (CARAFATE) 1 G tablet, Take by mouth., Disp: , Rfl:  .  sucralfate (CARAFATE) 1 G tablet, , Disp: , Rfl:  .  traMADol (ULTRAM) 50 MG tablet, Take 1 tablet (50 mg total) by mouth every 8 (eight) hours as needed., Disp: 30 tablet, Rfl: 0 .  venlafaxine (EFFEXOR) 37.5 MG tablet, Take by mouth., Disp: , Rfl:  .  venlafaxine (EFFEXOR) 37.5 MG tablet, Take by mouth., Disp: , Rfl:  .  venlafaxine XR (EFFEXOR-XR) 37.5 MG 24 hr capsule, , Disp: , Rfl:   Allergies  Allergen Reactions  . Codeine Other (See Comments) and Nausea And Vomiting    Other Reaction: Not Assessed Other Reaction: Not Assessed     Review of Systems  Respiratory:  Negative for shortness of breath.   Cardiovascular: Negative for chest pain and palpitations.  Gastrointestinal: Negative for abdominal pain.  Genitourinary: Negative for dysuria.     Objective  Filed Vitals:   07/17/15 1616  BP: 122/66  Pulse: 103  Temp: 98.7 F (37.1 C)  TempSrc: Oral  Resp: 19  Height: 5\' 1"  (1.549 m)  Weight: 193 lb 9.6 oz (87.816 kg)  SpO2: 97%    Physical Exam  Constitutional: She is well-developed, well-nourished, and in no distress.  HENT:  Left Ear: Hearing, tympanic membrane, external ear and ear canal normal.  Cardiovascular: Normal rate and regular rhythm.   Pulmonary/Chest: Effort normal and breath sounds normal.  Abdominal: Soft. Bowel sounds are normal.  Nursing note and vitals reviewed.   Assessment & Plan  1. Hypercholesterolemia with hypertriglyceridemia Lab work from health screening reviewed. Started patient on the Vascepa for hypertriglyceridemia. Patient has been intolerant to statin therapy on the past and she does not wish to try any other statin.  - TSH - Icosapent Ethyl (VASCEPA) 1 G CAPS; Take 2 capsules by mouth 2 (two) times daily.  Dispense: 120 capsule; Refill: 0  2. Obesity, Class II, BMI 35-39.9, with comorbidity  Referral to [facility] for management of obesity including dietary and lifestyle counseling. - Amb ref to Medical Nutrition Therapy-MNT   Deni Berti [facility] 07/17/2015 4:42 PM

## 2015-08-01 ENCOUNTER — Other Ambulatory Visit: Payer: Self-pay | Admitting: Family Medicine

## 2015-08-01 ENCOUNTER — Other Ambulatory Visit: Payer: Self-pay | Admitting: *Deleted

## 2015-08-01 MED ORDER — MELOXICAM 15 MG PO TABS
15.0000 mg | ORAL_TABLET | Freq: Every day | ORAL | Status: DC
Start: 2015-08-01 — End: 2017-04-03

## 2015-08-01 NOTE — Telephone Encounter (Signed)
Refilled meloxicam

## 2015-08-02 NOTE — Telephone Encounter (Signed)
Lisinopril 20 mg and Pantoprazole Sodium 40 mg has been refilled and sent to [facility]

## 2015-08-06 ENCOUNTER — Other Ambulatory Visit: Payer: Self-pay | Admitting: Family Medicine

## 2015-08-06 NOTE — Telephone Encounter (Signed)
[facility] sent 2nd request for refill.

## 2015-08-20 ENCOUNTER — Other Ambulatory Visit: Payer: Self-pay | Admitting: Family Medicine

## 2015-08-20 NOTE — Telephone Encounter (Signed)
Patient is requesting refill on controlled medications routed note to Dr. Manuella Ghazi for approval

## 2015-08-21 NOTE — Telephone Encounter (Signed)
Medication refill has been refused by Dr. Manuella Ghazi stating patient needs to schedule appointment

## 2015-09-03 ENCOUNTER — Encounter: Payer: Self-pay | Admitting: Family Medicine

## 2015-09-03 ENCOUNTER — Other Ambulatory Visit: Payer: Self-pay | Admitting: Family Medicine

## 2015-09-03 ENCOUNTER — Ambulatory Visit (INDEPENDENT_AMBULATORY_CARE_PROVIDER_SITE_OTHER): Payer: 59 | Admitting: Family Medicine

## 2015-09-03 VITALS — BP 144/85 | HR 93 | Temp 98.6°F | Resp 14 | Ht 62.0 in | Wt 185.2 lb

## 2015-09-03 DIAGNOSIS — H73002 Acute myringitis, left ear: Secondary | ICD-10-CM | POA: Diagnosis not present

## 2015-09-03 DIAGNOSIS — G4700 Insomnia, unspecified: Secondary | ICD-10-CM

## 2015-09-03 DIAGNOSIS — E669 Obesity, unspecified: Secondary | ICD-10-CM | POA: Insufficient documentation

## 2015-09-03 DIAGNOSIS — E781 Pure hyperglyceridemia: Secondary | ICD-10-CM | POA: Insufficient documentation

## 2015-09-03 DIAGNOSIS — J40 Bronchitis, not specified as acute or chronic: Secondary | ICD-10-CM | POA: Diagnosis not present

## 2015-09-03 MED ORDER — AZITHROMYCIN 250 MG PO TABS: ORAL_TABLET | ORAL | Status: DC

## 2015-09-03 NOTE — Telephone Encounter (Signed)
Patient as well as [facility] has been requesting a refill on Clonazepam 0.5mg  since 08/06/15. Patient last appointment was on 07/17/15 in which her medication was not filled during that appointment.  Patient stated that she is suppose to get a 90 day supply. Please call patient.

## 2015-09-03 NOTE — Progress Notes (Signed)
Date:  09/03/2015   Name:  Courtney King   DOB:  Nov 16, 1966   MRN:  [id number]  PCP:  Leata Mouse, NP    Chief Complaint: Cough   History of Present Illness:  This is a 49 y.o. female with 4d hx cough prod green phlegm, fever at night, slight sore throat, L sided headache and L ear pain.  Review of Systems:  Review of Systems  HENT: Negative for trouble swallowing.   Respiratory: Negative for shortness of breath and wheezing.     Patient Active Problem List   Diagnosis Date Noted  . Hypertriglyceridemia 09/03/2015  . Adiposity 09/03/2015  . Hypercholesterolemia with hypertriglyceridemia 07/17/2015  . Obesity, Class II, BMI 35-39.9, with comorbidity ([place]) 07/17/2015  . Anxiety 07/17/2015  . Dyslipidemia 07/17/2015  . Acid reflux 07/17/2015  . Hot flash, menopausal 07/17/2015    Prior to Admission medications   Medication Sig Start Date End Date Taking? Authorizing Provider  amoxicillin (AMOXIL) 500 MG capsule  12/07/14  Yes Historical Provider, MD  aspirin 81 MG tablet Take by mouth. 07/07/12  Yes Historical Provider, MD  aspirin EC 81 MG tablet Take by mouth.   Yes Historical Provider, MD  benzonatate (TESSALON) 100 MG capsule  08/30/15  Yes Historical Provider, MD  clonazePAM (KLONOPIN) 0.5 MG tablet Take by mouth.   Yes Historical Provider, MD  clonazePAM (KLONOPIN) 0.5 MG tablet Take by mouth.   Yes Historical Provider, MD  diclofenac sodium (VOLTAREN) 1 % GEL Apply 2 g topically 2 (two) times daily. Rub into affected area of foot 2 to 4 times daily 11/28/14  Yes Trula Slade, DPM  diclofenac sodium (VOLTAREN) 1 % GEL Apply topically. 11/28/14  Yes Historical Provider, MD  fluticasone (FLONASE) 50 MCG/ACT nasal spray Place into the nose. 08/30/15  Yes Historical Provider, MD  Icosapent Ethyl (VASCEPA) 1 G CAPS Take 2 capsules by mouth 2 (two) times daily. 07/17/15  Yes Roselee Nova, MD  lisinopril (PRINIVIL,ZESTRIL) 20 MG tablet Take 1 tablet (20 mg total) by mouth  daily. 08/02/15  Yes Roselee Nova, MD  meloxicam (MOBIC) 15 MG tablet Take 1 tablet (15 mg total) by mouth daily. 08/01/15  Yes Max T Hyatt, DPM  methylPREDNIsolone (MEDROL DOSPACK) 4 MG tablet follow package directions 07/27/14  Yes Max T Hyatt, DPM  Omega-3 Fatty Acids (OMEGA 3 PO) Take by mouth daily.   Yes Historical Provider, MD  oxyCODONE-acetaminophen (PERCOCET/ROXICET) 5-325 MG per tablet Take by mouth. 06/22/12  Yes Historical Provider, MD  pantoprazole (PROTONIX) 40 MG tablet Take 1 tablet (40 mg total) by mouth daily. 08/02/15  Yes Roselee Nova, MD  RABEprazole (ACIPHEX) 20 MG tablet Take by mouth.   Yes Historical Provider, MD  sucralfate (CARAFATE) 1 G tablet Take by mouth. 12/27/14  Yes Historical Provider, MD  sucralfate (CARAFATE) 1 G tablet  11/26/14  Yes Historical Provider, MD  traMADol (ULTRAM) 50 MG tablet Take 1 tablet (50 mg total) by mouth every 8 (eight) hours as needed. 03/21/15  Yes Trula Slade, DPM  venlafaxine (EFFEXOR) 37.5 MG tablet Take by mouth.   Yes Historical Provider, MD  venlafaxine (EFFEXOR) 37.5 MG tablet Take by mouth.   Yes Historical Provider, MD  venlafaxine XR (EFFEXOR-XR) 37.5 MG 24 hr capsule  12/07/14  Yes Historical Provider, MD  azithromycin (ZITHROMAX) 250 MG tablet Take two tablets today then one tablet daily for next four days 09/03/15   Adline Potter, MD  Allergies  Allergen Reactions  . Codeine Other (See Comments) and Nausea And Vomiting    Other Reaction: Not Assessed Other Reaction: Not Assessed    Past Surgical History  Procedure Laterality Date  . Oophorectomy Bilateral 2004  . Cholecystectomy  1999  . Abdominal hysterectomy  2004    Social History  Substance Use Topics  . Smoking status: Former Research scientist (life sciences)  . Smokeless tobacco: Never Used  . Alcohol Use: 0.0 oz/week    0 Standard drinks or equivalent per week    Family History  Problem Relation Age of Onset  . Cancer Mother     Medication list has been reviewed  and updated.  Physical Examination: BP 144/85 mmHg  Pulse 93  Temp(Src) 98.6 F (37 C) (Oral)  Resp 14  Ht 5\' 2"  (1.575 m)  Wt 185 lb 3.2 oz (84.006 kg)  BMI 33.86 kg/m2  SpO2 98%  Physical Exam  Constitutional: She appears well-developed and well-nourished.  HENT:  Mouth/Throat: No oropharyngeal exudate.  OP mild erythema R TM erythematous with small bullae  Cardiovascular: Normal rate, regular rhythm and normal heart sounds.   Pulmonary/Chest: Effort normal and breath sounds normal.  Lymphadenopathy:    She has no cervical adenopathy.  Nursing note and vitals reviewed.   Assessment and Plan:  1. Bronchitis Zpak, OTC cough syrup - azithromycin (ZITHROMAX) 250 MG tablet; Take two tablets today then one tablet daily for next four days  Dispense: 6 tablet; Refill: 0  2. Myringitis, acute, left Suspect mycoplasma, Zpak should cover   Return if symptoms worsen or fail to improve.  Satira Anis. [facility]  09/03/2015

## 2015-09-04 NOTE — Telephone Encounter (Signed)
Routed to Dr. Shah for approval 

## 2015-09-13 ENCOUNTER — Other Ambulatory Visit: Payer: Self-pay | Admitting: Family Medicine

## 2015-09-13 NOTE — Telephone Encounter (Signed)
Routed to Dr. Shah for approval 

## 2015-09-17 ENCOUNTER — Other Ambulatory Visit: Payer: Self-pay | Admitting: Emergency Medicine

## 2015-09-17 MED ORDER — CLONAZEPAM 0.5 MG PO TABS: 0.5000 mg | ORAL_TABLET | Freq: Every day | ORAL | Status: DC

## 2015-09-17 NOTE — Telephone Encounter (Signed)
Patient called need refill on Clonazepam. Did not get a script to last til next appointment. Script printed for Dr. Manuella Ghazi to sign and patient to pick up

## 2015-09-28 ENCOUNTER — Ambulatory Visit: Payer: 59 | Admitting: Family Medicine

## 2015-10-15 ENCOUNTER — Other Ambulatory Visit: Payer: Self-pay | Admitting: Family Medicine

## 2015-10-25 ENCOUNTER — Emergency Department
Admission: EM | Admit: 2015-10-25 | Discharge: 2015-10-25 | Disposition: A | Discharge status: Home / Self Care | Payer: 59 | Attending: Emergency Medicine | Admitting: Emergency Medicine

## 2015-10-25 ENCOUNTER — Emergency Department: Payer: 59

## 2015-10-25 ENCOUNTER — Encounter: Payer: Self-pay | Admitting: Emergency Medicine

## 2015-10-25 DIAGNOSIS — R0981 Nasal congestion: Secondary | ICD-10-CM | POA: Diagnosis not present

## 2015-10-25 DIAGNOSIS — R51 Headache: Secondary | ICD-10-CM | POA: Diagnosis not present

## 2015-10-25 DIAGNOSIS — R202 Paresthesia of skin: Secondary | ICD-10-CM

## 2015-10-25 DIAGNOSIS — I10 Essential (primary) hypertension: Secondary | ICD-10-CM | POA: Insufficient documentation

## 2015-10-25 DIAGNOSIS — R42 Dizziness and giddiness: Secondary | ICD-10-CM | POA: Diagnosis not present

## 2015-10-25 DIAGNOSIS — Z79899 Other long term (current) drug therapy: Secondary | ICD-10-CM | POA: Insufficient documentation

## 2015-10-25 DIAGNOSIS — Z87891 Personal history of nicotine dependence: Secondary | ICD-10-CM | POA: Insufficient documentation

## 2015-10-25 DIAGNOSIS — R000 Tachycardia, unspecified: Secondary | ICD-10-CM | POA: Insufficient documentation

## 2015-10-25 DIAGNOSIS — Z7982 Long term (current) use of aspirin: Secondary | ICD-10-CM | POA: Insufficient documentation

## 2015-10-25 DIAGNOSIS — R519 Headache, unspecified: Secondary | ICD-10-CM

## 2015-10-25 LAB — CBC
HCT: 41.7 % (ref 35.0–47.0)
HEMOGLOBIN: 14.4 g/dL (ref 12.0–16.0)
MCH: 31 pg (ref 26.0–34.0)
MCHC: 34.5 g/dL (ref 32.0–36.0)
MCV: 89.8 fL (ref 80.0–100.0)
PLATELETS: 318 10*3/uL (ref 150–440)
RBC: 4.64 MIL/uL (ref 3.80–5.20)
RDW: 13.6 % (ref 11.5–14.5)
WBC: 10.9 10*3/uL (ref 3.6–11.0)

## 2015-10-25 LAB — BASIC METABOLIC PANEL
Anion gap: 9 (ref 5–15)
BUN: 13 mg/dL (ref 6–20)
CO2: 26 mmol/L (ref 22–32)
CREATININE: 0.87 mg/dL (ref 0.44–1.00)
Calcium: 9.2 mg/dL (ref 8.9–10.3)
Chloride: 103 mmol/L (ref 101–111)
Glucose, Bld: 180 mg/dL — ABNORMAL HIGH (ref 65–99)
Potassium: 4 mmol/L (ref 3.5–5.1)
SODIUM: 138 mmol/L (ref 135–145)

## 2015-10-25 MED ORDER — SODIUM CHLORIDE 0.9 % IV BOLUS (SEPSIS)
1000.0000 mL | Freq: Once | INTRAVENOUS | Status: AC
Start: 2015-10-25 — End: 2015-10-25
  Administered 2015-10-25: 1000 mL via INTRAVENOUS

## 2015-10-25 MED ORDER — AZITHROMYCIN 250 MG PO TABS
500.0000 mg | ORAL_TABLET | Freq: Once | ORAL | Status: AC
  Administered 2015-10-25: 500 mg via ORAL
  Filled 2015-10-25: qty 2

## 2015-10-25 MED ORDER — AZITHROMYCIN 250 MG PO TABS: ORAL_TABLET | ORAL | Status: DC

## 2015-10-25 MED ORDER — KETOROLAC TROMETHAMINE 30 MG/ML IJ SOLN
30.0000 mg | Freq: Once | INTRAMUSCULAR | Status: AC
  Administered 2015-10-25: 30 mg via INTRAVENOUS
  Filled 2015-10-25: qty 1

## 2015-10-25 NOTE — ED Notes (Signed)
Pt c/o headache 5/10 that starts when she wakes up in the mornings, chills, and numbness x4 extremities when she's sleeping. Pt has no c/o numbness at this time.

## 2015-10-25 NOTE — ED Notes (Signed)
Discussed discharge instructions, prescriptions, and follow-up care with patient. No questions or concerns at this time. Pt stable at discharge.  

## 2015-10-25 NOTE — ED Notes (Signed)
Patient transported to CT 

## 2015-10-25 NOTE — ED Notes (Signed)
Pt states the past few days, she has been having a headache, htn, feels her heart is racing, and occasionally, all 4 limbs feel "numb" when she is laying down. Pt appears in no distress at this time. BP 141/65. Pt states she took her lisinopril last night.

## 2015-10-25 NOTE — ED Provider Notes (Signed)
[facility] Emergency Department Provider Note  ____________________________________________  Time seen: Approximately 1 PM  I have reviewed the triage vital signs and the nursing notes.   HISTORY  Chief Complaint Hypertension; Tachycardia; and Headache    HPI Courtney King is a 49 y.o. female with a history of aortic endarterectomy who is presenting today with a headache over the past week. She says the headache is worse in the mornings when she awakes. She says that what starts the headaches is when she rises from a lying down position when waking up. She says the bending over forward also makes her head hurt worse. She says that over the past 2 days she has experienced nasal congestion. Tried some Flonase at home which did not relieve her symptoms. She says that the headache is frontal. She denies any fever. Also says that she has lightheadedness in the mornings when she tries to stand up. She is also saying that during the nights when she wakes up in the middle the night that her arms and feet feel like they are asleep and did not have enough blood flow and she needs to shake him out. She denies any numbness or weakness at this time. Says at this time that her headache is a 6 out of 10. Describes the quality of the headache pain as pressure.  Past Medical History  Diagnosis Date  . Allergy   . Hyperlipidemia   . Hypertension   . GERD (gastroesophageal reflux disease)   . Anxiety     Patient Active Problem List   Diagnosis Date Noted  . Hypertriglyceridemia 09/03/2015  . Adiposity 09/03/2015  . Hypercholesterolemia with hypertriglyceridemia 07/17/2015  . Obesity, Class II, BMI 35-39.9, with comorbidity ([place]) 07/17/2015  . Anxiety 07/17/2015  . Dyslipidemia 07/17/2015  . Acid reflux 07/17/2015  . Hot flash, menopausal 07/17/2015    Past Surgical History  Procedure Laterality Date  . Oophorectomy Bilateral 2004  . Cholecystectomy  1999  .  Abdominal hysterectomy  2004    Current Outpatient Rx  Name  Route  Sig  Dispense  Refill  . aspirin EC 81 MG tablet   Oral   Take 81 mg by mouth at bedtime.         . clonazePAM (KLONOPIN) 0.5 MG tablet   Oral   Take 1 tablet (0.5 mg total) by mouth at bedtime.   30 tablet   0   . fluticasone (FLONASE) 50 MCG/ACT nasal spray   Nasal   Place 2 sprays into the nose daily as needed for allergies.          Vanessa Kick Ethyl (VASCEPA) 1 G CAPS   Oral   Take 2 capsules by mouth 2 (two) times daily. Patient taking differently: Take 1 capsule by mouth 2 (two) times daily.    120 capsule   0   . lisinopril (PRINIVIL,ZESTRIL) 20 MG tablet      Take 1 tablet by mouth  daily Patient taking differently: Take 1 tablet by mouth daily at bedtime   90 tablet   0   . meloxicam (MOBIC) 15 MG tablet   Oral   Take 1 tablet (15 mg total) by mouth daily. Patient taking differently: Take 15 mg by mouth daily as needed for pain.    30 tablet   3   . pantoprazole (PROTONIX) 40 MG tablet      Take 1 tablet by mouth  daily Patient taking differently: Take 1 tablet by mouth  daily at bedtime   90 tablet   0   . sucralfate (CARAFATE) 1 G tablet   Oral   Take 1 g by mouth 3 (three) times daily as needed (for severe reflux).          . venlafaxine XR (EFFEXOR-XR) 37.5 MG 24 hr capsule      Take 1 capsule by mouth  daily Patient taking differently: Take 1 capsule by mouth  daily at bedtime   90 capsule   0     Allergies Codeine  Family History  Problem Relation Age of Onset  . Cancer Mother     Social History Social History  Substance Use Topics  . Smoking status: Former Research scientist (life sciences)  . Smokeless tobacco: Never Used  . Alcohol Use: 0.0 oz/week    0 Standard drinks or equivalent per week    Review of Systems Constitutional: No fever/chills Eyes: No visual changes. ENT: No sore throat. Cardiovascular: Denies chest pain. Respiratory: Denies shortness of  breath. Gastrointestinal: No abdominal pain.  No nausea, no vomiting.  No diarrhea.  No constipation. Genitourinary: Negative for dysuria. Musculoskeletal: Negative for back pain. Skin: Negative for rash. Neurological: Negative for focal weakness  10-point ROS otherwise negative.  ____________________________________________   PHYSICAL EXAM:  VITAL SIGNS: ED Triage Vitals  Enc Vitals Group     BP 10/25/15 1036 141/65 mmHg     Pulse Rate 10/25/15 1036 114     Resp 10/25/15 1036 18     Temp 10/25/15 1036 98.7 F (37.1 C)     Temp Source 10/25/15 1036 Oral     SpO2 10/25/15 1036 96 %     Weight 10/25/15 1036 179 lb (81.194 kg)     Height 10/25/15 1036 5\' 1"  (1.549 m)     Head Cir --      Peak Flow --      Pain Score 10/25/15 1037 8     Pain Loc --      Pain Edu? --      Excl. in [place]? --     Constitutional: Alert and oriented. Well appearing and in no acute distress. Eyes: Conjunctivae are normal. PERRL. EOMI. Head: Atraumatic. Tenderness palpation to the frontal sinus as well as left maxillary sinus. Nose: Erythematous and swollen turbinates bilaterally. Mouth/Throat: Mucous membranes are moist.  Oropharynx non-erythematous. Neck: No stridor.   Cardiovascular: Tachycardic, regular rhythm. Grossly normal heart sounds.  Good peripheral circulation. Bilateral and equal radial as well as dorsalis pedis pulses. Respiratory: Normal respiratory effort.  No retractions. Lungs CTAB. Gastrointestinal: Soft and nontender. No distention. No abdominal bruits. No CVA tenderness. Musculoskeletal: No lower extremity tenderness nor edema.  No joint effusions. Neurologic:  Normal speech and language. No gross focal neurologic deficits are appreciated. No gait instability. Skin:  Skin is warm, dry and intact. No rash noted. Psychiatric: Mood and affect are normal. Speech and behavior are normal.  ____________________________________________   LABS (all labs ordered are listed, but only  abnormal results are displayed)  Labs Reviewed  BASIC METABOLIC PANEL - Abnormal; Notable for the following:    Glucose, Bld 180 (*)    All other components within normal limits  CBC   ____________________________________________  EKG  ED ECG REPORT I, Schaevitz,  Youlanda Roys, the attending physician, personally viewed and interpreted this ECG.   Date: 10/25/2015  EKG Time: 1028  Rate: 1:15  Rhythm: normal EKG, normal sinus rhythm, unchanged from previous tracings, sinus tachycardia  Axis: Rightward axis  Intervals:none  ST&T Change: No ST segment elevation or depression. No abnormal T-wave inversions.  ____________________________________________  RADIOLOGY  Normal head CT ____________________________________________   PROCEDURES    ____________________________________________   INITIAL IMPRESSION / ASSESSMENT AND PLAN / ED COURSE  Pertinent labs & imaging results that were available during my care of the patient were reviewed by me and considered in my medical decision making (see chart for details).  ----------------------------------------- 3:41 PM on 10/25/2015 ----------------------------------------- Patient without any distress this time. When patient breathing calmly and resting in bed her heart rate is anywhere from 94-97 bpm. She says her headache pain is improved to about a 4 out of 10. Given her constellation of symptoms with nasal congestion as well as frontal headache I believe this to be likely related to sinus pressure. The issue of her upper or lower extremities going numb at night when she is asleep is slightly more curious. I discussed with her further testing such as an MRV to rule out a cerebral venous thrombosis. However, the patient says that she would like to go home at this time and not undergo any further testing. I'll prescribe for her azithromycin and recommended decongestants for her nasal issues. She understands the plan is going to comply. She  understands that she also may return at any time for further testing and treatment. I feel that CVA is a less likely diagnosis secondary to the symptoms ongoing for 1 week with a normal CAT scan of the brain. Also the numbness is bilateral. There is also no associated weakness.   ____________________________________________   FINAL CLINICAL IMPRESSION(S) / ED DIAGNOSES  Headache. Nasal congestion. Paresthesia.    Orbie Pyo, MD 10/25/15 [phone]

## 2015-11-02 ENCOUNTER — Encounter: Payer: Self-pay | Admitting: Family Medicine

## 2015-11-02 ENCOUNTER — Ambulatory Visit (INDEPENDENT_AMBULATORY_CARE_PROVIDER_SITE_OTHER): Payer: 59 | Admitting: Family Medicine

## 2015-11-02 VITALS — BP 130/77 | HR 97 | Temp 98.2°F | Resp 16 | Ht 62.0 in | Wt 187.8 lb

## 2015-11-02 DIAGNOSIS — Z9889 Other specified postprocedural states: Secondary | ICD-10-CM

## 2015-11-02 DIAGNOSIS — IMO0001 Reserved for inherently not codable concepts without codable children: Secondary | ICD-10-CM

## 2015-11-02 DIAGNOSIS — E785 Hyperlipidemia, unspecified: Secondary | ICD-10-CM | POA: Diagnosis not present

## 2015-11-02 DIAGNOSIS — K219 Gastro-esophageal reflux disease without esophagitis: Secondary | ICD-10-CM

## 2015-11-02 DIAGNOSIS — I10 Essential (primary) hypertension: Secondary | ICD-10-CM | POA: Diagnosis not present

## 2015-11-02 DIAGNOSIS — N951 Menopausal and female climacteric states: Secondary | ICD-10-CM

## 2015-11-02 DIAGNOSIS — R519 Headache, unspecified: Secondary | ICD-10-CM

## 2015-11-02 DIAGNOSIS — R51 Headache: Secondary | ICD-10-CM | POA: Diagnosis not present

## 2015-11-02 MED ORDER — PANTOPRAZOLE SODIUM 40 MG PO TBEC: 40.0000 mg | DELAYED_RELEASE_TABLET | Freq: Two times a day (BID) | ORAL | Status: DC

## 2015-11-02 MED ORDER — OXYMETAZOLINE HCL 0.05 % NA SOLN: 1.0000 | Freq: Two times a day (BID) | NASAL | Status: DC

## 2015-11-02 MED ORDER — CLONAZEPAM 0.5 MG PO TABS: 0.5000 mg | ORAL_TABLET | Freq: Every day | ORAL | Status: DC

## 2015-11-02 NOTE — Patient Instructions (Signed)
HA: Let's try a decongestant for your sinus HA. Maybe this will help. You can use Afrin OTC for 3 days and 3 days only to help with congestion. This will help with your ears.  You can use Tylenol Sinus OTC for congestion but use sparingly due to your blood pressure.   If the HA persist, please keep a headache diary- write down when it occurs, any foods, stress, changes to environment etc.   We will check your lab work and talk more about your cholesterol from there.   Acid reflux: Try protonix twice daily.

## 2015-11-02 NOTE — Assessment & Plan Note (Signed)
Pt not currently on statin. Given history of endarterectomy- pt should likely be on statin therapy. Check lipids today.  Pt on aspirin for primary prevention.

## 2015-11-02 NOTE — Assessment & Plan Note (Signed)
Continue venlafaxine 

## 2015-11-02 NOTE — Assessment & Plan Note (Signed)
Encouraged 123minutes of exercise per week to promote weight loss.

## 2015-11-02 NOTE — Progress Notes (Signed)
Subjective:    Patient ID: Courtney King, female    DOB: [birth year], 49 y.o.   MRN: [id number]  HPI: Courtney King is a 49 y.o. female presenting on 11/02/2015 for Establish Care   HPI  Pt presents to establish care today- seen for sick visit by Dr. Vicente Masson a few months ago. Previous care provider was Dr. Manuella Ghazi at [place]  It has been 3 months since Her last PCP visit. Records from previous provider will be requested and reviewed. Current medical problems include:  Anxiety: Takes clonazepam every evening to help with sleep. Been on this medication for 3 years. 0.5mg    Hot flashes: Takes effexor for hot flashes. Working well.  High cholesterol: Taking fish oil OTC. High cholesterol- states she has never tried a statin she is concerned about them. 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  GERD: Well controlled with Protonix 40mg - sometimes throws up acid in the morning. Recently for gastritis.  Pre-diabetes: Last A1c was 5.8% Pt was unaware she had.  Hypertension: Lisinopril 20mg  daily. 130-150/75-88. No HA or visual changes. Denies CP or SOB.   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 Thought to be a sinus HA. Taking excedrin migraine for it with good relief. Finishing up Zpak for sinus infection. Still have congestion  Health maintenance:  Last mammo: 2014- due this year. Card given Pap: unsure- had total hysterectomy. For bleeding.  Unsure last tetanus.    Past Medical History  Diagnosis Date  . Allergy   . Hyperlipidemia   . Hypertension   . GERD (gastroesophageal reflux disease)   . Anxiety   . Left leg claudication [facility])     Current Outpatient Prescriptions on File Prior to Visit  Medication Sig  . aspirin EC 81 MG tablet Take 81 mg by mouth at bedtime.  . fluticasone (FLONASE) 50 MCG/ACT nasal spray Place 2 sprays into the nose daily as needed for allergies.   Vanessa Kick Ethyl (VASCEPA) 1 G CAPS Take  2 capsules by mouth 2 (two) times daily. (Patient taking differently: Take 1 capsule by mouth 2 (two) times daily. )  . lisinopril (PRINIVIL,ZESTRIL) 20 MG tablet Take 1 tablet by mouth  daily (Patient taking differently: Take 1 tablet by mouth daily at bedtime)  . meloxicam (MOBIC) 15 MG tablet Take 1 tablet (15 mg total) by mouth daily. (Patient taking differently: Take 15 mg by mouth as needed for pain. )  . sucralfate (CARAFATE) 1 G tablet Take 1 g by mouth 3 (three) times daily as needed (for severe reflux).   . venlafaxine XR (EFFEXOR-XR) 37.5 MG 24 hr capsule Take 1 capsule by mouth  daily (Patient taking differently: Take 1 capsule by mouth  daily at bedtime)   No current facility-administered medications on file prior to visit.    Review of Systems  Constitutional: Negative for fever and chills.  HENT: Positive for congestion and ear pain.   Respiratory: Negative for cough, chest tightness and wheezing.   Cardiovascular: Negative for chest pain and leg swelling.  Gastrointestinal: Negative for nausea, vomiting, abdominal pain, diarrhea and constipation.  Endocrine: Negative.  Negative for cold intolerance, heat intolerance, polydipsia, polyphagia and polyuria.  Genitourinary: Negative for dysuria and difficulty urinating.  Musculoskeletal: Negative.   Neurological: Positive for headaches. Negative for dizziness, light-headedness and numbness.  Psychiatric/Behavioral: Negative.    Per HPI unless specifically indicated above     Objective:  BP 130/77 mmHg  Pulse 97  Temp(Src) 98.2 F (36.8 C) (Oral)  Resp 16  Ht 5\' 2"  (1.575 m)  Wt 187 lb 12.8 oz (85.186 kg)  BMI 34.34 kg/m2  Wt Readings from Last 3 Encounters:  11/02/15 187 lb 12.8 oz (85.186 kg)  10/25/15 179 lb (81.194 kg)  09/03/15 185 lb 3.2 oz (84.006 kg)    Physical Exam  Constitutional: She is oriented to person, place, and time. She appears well-developed and well-nourished.  HENT:  Head: Normocephalic and  atraumatic.  Right Ear: Hearing and tympanic membrane normal.  Left Ear: Hearing normal. Tympanic membrane is retracted.  Nose: No mucosal edema or rhinorrhea. Right sinus exhibits frontal sinus tenderness. Left sinus exhibits frontal sinus tenderness.  Mouth/Throat: Oropharynx is clear and moist and mucous membranes are normal. No posterior oropharyngeal erythema.  Neck: Normal range of motion. Neck supple.  Cardiovascular: Normal rate, regular rhythm and normal heart sounds.  Exam reveals no gallop and no friction rub.   No murmur heard. Pulmonary/Chest: Effort normal and breath sounds normal. She has no wheezes. She exhibits no tenderness.  Abdominal: Soft. Normal appearance and bowel sounds are normal. She exhibits no distension and no mass. There is no tenderness. There is no rebound and no guarding.  Musculoskeletal: Normal range of motion. She exhibits no edema or tenderness.  Lymphadenopathy:    She has no cervical adenopathy.  Neurological: She is alert and oriented to person, place, and time.  Skin: Skin is warm and dry.   Results for orders placed or performed during the hospital encounter of 10/25/15  CBC  Result Value Ref Range   WBC 10.9 3.6 - 11.0 K/uL   RBC 4.64 3.80 - 5.20 MIL/uL   Hemoglobin 14.4 12.0 - 16.0 g/dL   HCT 41.7 35.0 - 47.0 %   MCV 89.8 80.0 - 100.0 fL   MCH 31.0 26.0 - 34.0 pg   MCHC 34.5 32.0 - 36.0 g/dL   RDW 13.6 11.5 - 14.5 %   Platelets 318 150 - 440 K/uL  Basic metabolic panel  Result Value Ref Range   Sodium 138 135 - 145 mmol/L   Potassium 4.0 3.5 - 5.1 mmol/L   Chloride 103 101 - 111 mmol/L   CO2 26 22 - 32 mmol/L   Glucose, Bld 180 (H) 65 - 99 mg/dL   BUN 13 6 - 20 mg/dL   Creatinine, Ser 0.87 0.44 - 1.00 mg/dL   Calcium 9.2 8.9 - 10.3 mg/dL   GFR calc non Af Amer >60 >60 mL/min   GFR calc Af Amer >60 >60 mL/min   Anion gap 9 5 - 15      Assessment & Plan:   Problem List Items Addressed This Visit      Cardiovascular and  Mediastinum   Hypertension    Controlled with lisinopril. Encouraged DASH diet. Check BP regularly. CMET today.         Digestive   Acid reflux    Increase Protonix to BID to help with symptom management. Consider GI referral if regurg and vomiting continue.  Consider PRN Zantac.       Relevant Medications   pantoprazole (PROTONIX) 40 MG tablet   Other Relevant Orders   CBC with Differential     Other   Obesity, Class II, BMI 35-39.9, with comorbidity ([place])    Encouraged 1108minutes of exercise per week to promote weight loss.       Dyslipidemia    Pt not currently  on statin. Given history of endarterectomy- pt should likely be on statin therapy. Check lipids today.  Pt on aspirin for primary prevention.       Relevant Orders   Lipid Profile   Comprehensive Metabolic Panel (CMET)   Hot flash, menopausal    Continue venlafaxine.       Relevant Orders   Comprehensive Metabolic Panel (CMET)   Hx of endarterectomy - Primary    Pt is not on a statin. Unclear if she has regular vascular follow-up. Check lipids.        Other Visit Diagnoses    Sinus headache        Afrin for congestion with comorbid hypertension. Excedrin as needed for HA. Keep HA diary if HA continues.     Relevant Medications    oxymetazoline (AFRIN NASAL SPRAY) 0.05 % nasal spray    clonazePAM (KLONOPIN) 0.5 MG tablet       Meds ordered this encounter  Medications  . oxymetazoline (AFRIN NASAL SPRAY) 0.05 % nasal spray    Sig: Place 1 spray into both nostrils 2 (two) times daily.    Dispense:  30 mL    Refill:  0    Order Specific Question:  Supervising Provider    Answer:  Arlis Porta [id number]  . pantoprazole (PROTONIX) 40 MG tablet    Sig: Take 1 tablet (40 mg total) by mouth 2 (two) times daily.    Dispense:  180 tablet    Refill:  0    Order Specific Question:  Supervising Provider    Answer:  Arlis Porta [phone]  . clonazePAM (KLONOPIN) 0.5 MG tablet    Sig: Take 1  tablet (0.5 mg total) by mouth at bedtime.    Dispense:  30 tablet    Refill:  0    Order Specific Question:  Supervising Provider    Answer:  Arlis Porta [id number]      Follow up plan: Return in about 4 weeks (around 11/30/2015) for GERD.

## 2015-11-02 NOTE — Assessment & Plan Note (Signed)
Pt is not on a statin. Unclear if she has regular vascular follow-up. Check lipids.

## 2015-11-02 NOTE — Assessment & Plan Note (Signed)
Controlled with lisinopril. Encouraged DASH diet. Check BP regularly. CMET today.

## 2015-11-02 NOTE — Assessment & Plan Note (Signed)
Increase Protonix to BID to help with symptom management. Consider GI referral if regurg and vomiting continue.  Consider PRN Zantac.

## 2015-12-04 ENCOUNTER — Telehealth: Payer: Self-pay | Admitting: Family Medicine

## 2015-12-04 DIAGNOSIS — R7303 Prediabetes: Secondary | ICD-10-CM

## 2015-12-04 LAB — COMPREHENSIVE METABOLIC PANEL
A/G RATIO: 1.3 (ref 1.1–2.5)
ALBUMIN: 3.8 g/dL (ref 3.5–5.5)
ALK PHOS: 89 IU/L (ref 39–117)
ALT: 20 IU/L (ref 0–32)
AST: 14 IU/L (ref 0–40)
BUN / CREAT RATIO: 14 (ref 9–23)
BUN: 13 mg/dL (ref 6–24)
Bilirubin Total: 0.3 mg/dL (ref 0.0–1.2)
CO2: 23 mmol/L (ref 18–29)
CREATININE: 0.91 mg/dL (ref 0.57–1.00)
Calcium: 9.7 mg/dL (ref 8.7–10.2)
Chloride: 101 mmol/L (ref 96–106)
GFR calc Af Amer: 86 mL/min/{1.73_m2} (ref >59)
GFR, EST NON AFRICAN AMERICAN: 74 mL/min/{1.73_m2} (ref >59)
GLOBULIN, TOTAL: 3 g/dL (ref 1.5–4.5)
Glucose: 119 mg/dL — ABNORMAL HIGH (ref 65–99)
Potassium: 5 mmol/L (ref 3.5–5.2)
SODIUM: 141 mmol/L (ref 134–144)
Total Protein: 6.8 g/dL (ref 6.0–8.5)

## 2015-12-04 LAB — LIPID PANEL
CHOLESTEROL TOTAL: 176 mg/dL (ref 100–199)
Chol/HDL Ratio: 5.2 ratio units — ABNORMAL HIGH (ref 0.0–4.4)
HDL: 34 mg/dL — AB (ref >39)
LDL CALC: 102 mg/dL — AB (ref 0–99)
TRIGLYCERIDES: 198 mg/dL — AB (ref 0–149)
VLDL Cholesterol Cal: 40 mg/dL (ref 5–40)

## 2015-12-04 LAB — CBC WITH DIFFERENTIAL/PLATELET
BASOS: 1 %
Basophils Absolute: 0.1 10*3/uL (ref 0.0–0.2)
EOS (ABSOLUTE): 0.3 10*3/uL (ref 0.0–0.4)
EOS: 4 %
HEMATOCRIT: 41.9 % (ref 34.0–46.6)
Hemoglobin: 14.2 g/dL (ref 11.1–15.9)
Immature Grans (Abs): 0 10*3/uL (ref 0.0–0.1)
Immature Granulocytes: 0 %
LYMPHS ABS: 2.8 10*3/uL (ref 0.7–3.1)
Lymphs: 38 %
MCH: 30.5 pg (ref 26.6–33.0)
MCHC: 33.9 g/dL (ref 31.5–35.7)
MCV: 90 fL (ref 79–97)
MONOS ABS: 0.8 10*3/uL (ref 0.1–0.9)
Monocytes: 10 %
Neutrophils Absolute: 3.5 10*3/uL (ref 1.4–7.0)
Neutrophils: 47 %
Platelets: 399 10*3/uL — ABNORMAL HIGH (ref 150–379)
RBC: 4.65 x10E6/uL (ref 3.77–5.28)
RDW: 13.4 % (ref 12.3–15.4)
WBC: 7.5 10*3/uL (ref 3.4–10.8)

## 2015-12-04 NOTE — Telephone Encounter (Signed)
Called [facility] to add on HgA1c.

## 2015-12-06 LAB — HGB A1C W/O EAG: HEMOGLOBIN A1C: 6.4 % — AB (ref 4.8–5.6)

## 2015-12-06 LAB — SPECIMEN STATUS REPORT

## 2015-12-07 ENCOUNTER — Encounter: Payer: Self-pay | Admitting: Family Medicine

## 2015-12-07 ENCOUNTER — Telehealth: Payer: Self-pay

## 2015-12-07 ENCOUNTER — Other Ambulatory Visit: Payer: Self-pay

## 2015-12-07 ENCOUNTER — Ambulatory Visit (INDEPENDENT_AMBULATORY_CARE_PROVIDER_SITE_OTHER): Payer: 59 | Admitting: Family Medicine

## 2015-12-07 VITALS — BP 119/75 | HR 92 | Temp 98.9°F | Resp 16 | Ht 62.0 in | Wt 185.2 lb

## 2015-12-07 DIAGNOSIS — K219 Gastro-esophageal reflux disease without esophagitis: Secondary | ICD-10-CM

## 2015-12-07 DIAGNOSIS — Z8041 Family history of malignant neoplasm of ovary: Secondary | ICD-10-CM | POA: Diagnosis not present

## 2015-12-07 DIAGNOSIS — F419 Anxiety disorder, unspecified: Secondary | ICD-10-CM | POA: Diagnosis not present

## 2015-12-07 DIAGNOSIS — Z800 Family history of malignant neoplasm of digestive organs: Secondary | ICD-10-CM | POA: Diagnosis not present

## 2015-12-07 DIAGNOSIS — R7303 Prediabetes: Secondary | ICD-10-CM

## 2015-12-07 MED ORDER — METFORMIN HCL ER (OSM) 500 MG PO TB24: 500.0000 mg | ORAL_TABLET | Freq: Every day | ORAL | Status: DC

## 2015-12-07 MED ORDER — CLONAZEPAM 0.5 MG PO TABS: 0.5000 mg | ORAL_TABLET | Freq: Every day | ORAL | Status: DC

## 2015-12-07 NOTE — Telephone Encounter (Signed)
Gastroenterology Pre-Procedure Review  Request Date:  Requesting Physician: Dr.   PATIENT REVIEW QUESTIONS: The patient responded to the following health history questions as indicated:    1. Are you having any GI issues? no 2. Do you have a personal history of Polyps? no 3. Do you have a family history of Colon Cancer or Polyps? no 4. Diabetes Mellitus? yes (Prediabetes) 5. Joint replacements in the past 12 months?no 6. Major health problems in the past 3 months? No 7. Any artificial heart valves, MVP, or defibrillator?yes (Aortic blockage 2013)    MEDICATIONS & ALLERGIES:    Patient reports the following regarding taking any anticoagulation/antiplatelet therapy:   Plavix, Coumadin, Eliquis, Xarelto, Lovenox, Pradaxa, Brilinta, or Effient? no Aspirin? yes (ASA 81mg )  Patient confirms/reports the following medications:  Current Outpatient Prescriptions  Medication Sig Dispense Refill  . aspirin EC 81 MG tablet Take 81 mg by mouth at bedtime.    . clonazePAM (KLONOPIN) 0.5 MG tablet Take 1 tablet (0.5 mg total) by mouth at bedtime. 90 tablet 0  . etodolac (LODINE) 500 MG tablet Take by mouth.    . fluticasone (FLONASE) 50 MCG/ACT nasal spray Place 2 sprays into the nose daily as needed for allergies.     Vanessa Kick Ethyl (VASCEPA) 1 G CAPS Take 2 capsules by mouth 2 (two) times daily. (Patient taking differently: Take 1 capsule by mouth 2 (two) times daily. ) 120 capsule 0  . lisinopril (PRINIVIL,ZESTRIL) 20 MG tablet Take 1 tablet by mouth  daily (Patient taking differently: Take 1 tablet by mouth daily at bedtime) 90 tablet 0  . meloxicam (MOBIC) 15 MG tablet Take 1 tablet (15 mg total) by mouth daily. (Patient taking differently: Take 15 mg by mouth as needed for pain. ) 30 tablet 3  . metformin (FORTAMET) 500 MG (OSM) 24 hr tablet Take 1 tablet (500 mg total) by mouth daily with breakfast. 90 tablet 3  . oxymetazoline (AFRIN NASAL SPRAY) 0.05 % nasal spray Place 1 spray into both  nostrils 2 (two) times daily. 30 mL 0  . pantoprazole (PROTONIX) 40 MG tablet Take 1 tablet (40 mg total) by mouth 2 (two) times daily. 180 tablet 0  . RABEprazole (ACIPHEX) 20 MG tablet Take by mouth.    . sucralfate (CARAFATE) 1 G tablet Take 1 g by mouth 3 (three) times daily as needed (for severe reflux).     . venlafaxine XR (EFFEXOR-XR) 37.5 MG 24 hr capsule Take 1 capsule by mouth  daily (Patient taking differently: Take 1 capsule by mouth  daily at bedtime) 90 capsule 0   No current facility-administered medications for this visit.    Patient confirms/reports the following allergies:  Allergies  Allergen Reactions  . Codeine Other (See Comments), Nausea And Vomiting and Nausea Only    Other Reaction: Not Assessed Other Reaction: Not Assessed    No orders of the defined types were placed in this encounter.    AUTHORIZATION INFORMATION Primary Insurance: 1D#: Group #:  Secondary Insurance: 1D#: Group #:  SCHEDULE INFORMATION: Date: 12/28/15 Time: Location: [place]

## 2015-12-07 NOTE — Assessment & Plan Note (Signed)
A1c today is 6.4%- start metformin once daily to help reduce A1c and with weight loss. Pt will enter [facility] program to help prevent diabetes. Continue to reduce sodas.  Encouraged increasing exercise, reducing breads, rice, pasta.  Recheck 3 mos.

## 2015-12-07 NOTE — Assessment & Plan Note (Signed)
Refer to GI for colon cancer screening if insurance will cover based on family history.

## 2015-12-07 NOTE — Assessment & Plan Note (Signed)
Clonazepam renewed today.

## 2015-12-07 NOTE — Patient Instructions (Signed)
Prediabetes: Please send any paperwork [facility] needs for your diabetes program over to the office. I think this would be helpful. I sent Metformin to [facility]- take it once daily.  This should help with weight loss and reducing your sugars.

## 2015-12-07 NOTE — Assessment & Plan Note (Signed)
Continue protonix as it seems to be controlling her symptoms. Advised to avoid dietary triggers.

## 2015-12-07 NOTE — Assessment & Plan Note (Signed)
Give strong family history- BrCAssure testing ordered today. Have discussed having pt see a genetics counselor to talk about her risk. Encouraged yearly mammograms to screen for breast cancer.  I spent >20 minutes face to face counseling about hereditary cancers and increased risk for cancer given family history.

## 2015-12-07 NOTE — Progress Notes (Signed)
Subjective:    Patient ID: Courtney King, female    DOB: [birth year], 50 y.o.   MRN: [id number]  HPI: Courtney King is a 49 y.o. female presenting on 12/07/2015 for Gastroesophageal Reflux   HPI  Pt presents for acid reflux follow-up.  Symptoms resolved with medication. No regurgitation, throwing up blood, or dysphagia.  Prediabetes: Last A1c was 5.7%. Has given up soda. Is trying to exercise regularly. [facility] has a program for prediabetes. She will look into doing that at her office. Anxiety: Taking clonazepam once daily at bedtime. Has been on for many years.Recently more anxious due to Mom's cancer diagnosis.  Requesting refill today.  Mom recently diagnosed with ovarian cancer- it is metastatic. This is mother's second round of ovarian cancer Aunt has colon cancer- diagnosed age 58. They are doing genetic testing. Other died at 3 of ovarian cancer. Ovarian and colon cancers run in the family. She is concerned about her risks. Had a hysterectomy and ovarian removal at age 50.  She is concerned about increased risk for breast and colon cancers. Is desirous of genetic testing.  She would also like to screen for colon cancer via colonscopy.    Past Medical History  Diagnosis Date  . Allergy   . Hyperlipidemia   . Hypertension   . GERD (gastroesophageal reflux disease)   . Anxiety   . Left leg claudication [facility])     Current Outpatient Prescriptions on File Prior to Visit  Medication Sig  . aspirin EC 81 MG tablet Take 81 mg by mouth at bedtime.  . fluticasone (FLONASE) 50 MCG/ACT nasal spray Place 2 sprays into the nose daily as needed for allergies.   Vanessa Kick Ethyl (VASCEPA) 1 G CAPS Take 2 capsules by mouth 2 (two) times daily. (Patient taking differently: Take 1 capsule by mouth 2 (two) times daily. )  . lisinopril (PRINIVIL,ZESTRIL) 20 MG tablet Take 1 tablet by mouth  daily (Patient taking differently: Take 1 tablet by mouth daily at bedtime)  . meloxicam (MOBIC) 15 MG  tablet Take 1 tablet (15 mg total) by mouth daily. (Patient taking differently: Take 15 mg by mouth as needed for pain. )  . oxymetazoline (AFRIN NASAL SPRAY) 0.05 % nasal spray Place 1 spray into both nostrils 2 (two) times daily.  . pantoprazole (PROTONIX) 40 MG tablet Take 1 tablet (40 mg total) by mouth 2 (two) times daily.  . sucralfate (CARAFATE) 1 G tablet Take 1 g by mouth 3 (three) times daily as needed (for severe reflux).   . venlafaxine XR (EFFEXOR-XR) 37.5 MG 24 hr capsule Take 1 capsule by mouth  daily (Patient taking differently: Take 1 capsule by mouth  daily at bedtime)   No current facility-administered medications on file prior to visit.    Review of Systems  Constitutional: Negative for fever and chills.  HENT: Negative.   Respiratory: Negative for cough, chest tightness and wheezing.   Cardiovascular: Negative for chest pain and leg swelling.  Gastrointestinal: Negative for nausea, vomiting, abdominal pain, diarrhea and constipation.  Endocrine: Negative.  Negative for cold intolerance, heat intolerance, polydipsia, polyphagia and polyuria.  Genitourinary: Negative for dysuria and difficulty urinating.  Musculoskeletal: Negative.   Neurological: Negative for dizziness, light-headedness and numbness.  Psychiatric/Behavioral: The patient is nervous/anxious.    Per HPI unless specifically indicated above     Objective:    BP 119/75 mmHg  Pulse 92  Temp(Src) 98.9 F (37.2 C) (Oral)  Resp 16  Ht 5\' 2"  (  1.575 m)  Wt 185 lb 3.2 oz (84.006 kg)  BMI 33.86 kg/m2  Wt Readings from Last 3 Encounters:  12/07/15 185 lb 3.2 oz (84.006 kg)  11/02/15 187 lb 12.8 oz (85.186 kg)  10/25/15 179 lb (81.194 kg)    Depression screen [facility] 2/9 12/07/2015 11/02/2015 07/17/2015  Decreased Interest 0 0 0  Down, Depressed, Hopeless 0 0 0  PHQ - 2 Score 0 0 0  Altered sleeping 3 - -  Tired, decreased energy 1 - -  Change in appetite 0 - -  Feeling bad or failure about yourself  0 - -   Trouble concentrating 0 - -  Moving slowly or fidgety/restless 0 - -  Suicidal thoughts 0 - -  PHQ-9 Score 4 - -  Difficult doing work/chores Somewhat difficult - -     Physical Exam  Constitutional: She is oriented to person, place, and time. She appears well-developed and well-nourished.  HENT:  Head: Normocephalic and atraumatic.  Neck: Neck supple.  Cardiovascular: Normal rate, regular rhythm and normal heart sounds.  Exam reveals no gallop and no friction rub.   No murmur heard. Pulmonary/Chest: Effort normal and breath sounds normal. She has no wheezes. She exhibits no tenderness.  Abdominal: Soft. Normal appearance and bowel sounds are normal. She exhibits no distension and no mass. There is no tenderness. There is no rebound and no guarding.  Musculoskeletal: Normal range of motion. She exhibits no edema or tenderness.  Lymphadenopathy:    She has no cervical adenopathy.  Neurological: She is alert and oriented to person, place, and time.  Skin: Skin is warm and dry.  Psychiatric: She has a normal mood and affect. Her behavior is normal. Judgment and thought content normal.   Results for orders placed or performed in visit on 11/02/15  Lipid Profile  Result Value Ref Range   Cholesterol, Total 176 100 - 199 mg/dL   Triglycerides 198 (H) 0 - 149 mg/dL   HDL 34 (L) >39 mg/dL   VLDL Cholesterol Cal 40 5 - 40 mg/dL   LDL Calculated 102 (H) 0 - 99 mg/dL   Chol/HDL Ratio 5.2 (H) 0.0 - 4.4 ratio units  Comprehensive Metabolic Panel (CMET)  Result Value Ref Range   Glucose 119 (H) 65 - 99 mg/dL   BUN 13 6 - 24 mg/dL   Creatinine, Ser 0.91 0.57 - 1.00 mg/dL   GFR calc non Af Amer 74 >59 mL/min/1.73   GFR calc Af Amer 86 >59 mL/min/1.73   BUN/Creatinine Ratio 14 9 - 23   Sodium 141 134 - 144 mmol/L   Potassium 5.0 3.5 - 5.2 mmol/L   Chloride 101 96 - 106 mmol/L   CO2 23 18 - 29 mmol/L   Calcium 9.7 8.7 - 10.2 mg/dL   Total Protein 6.8 6.0 - 8.5 g/dL   Albumin 3.8 3.5 -  5.5 g/dL   Globulin, Total 3.0 1.5 - 4.5 g/dL   Albumin/Globulin Ratio 1.3 1.1 - 2.5   Bilirubin Total 0.3 0.0 - 1.2 mg/dL   Alkaline Phosphatase 89 39 - 117 IU/L   AST 14 0 - 40 IU/L   ALT 20 0 - 32 IU/L  CBC with Differential  Result Value Ref Range   WBC 7.5 3.4 - 10.8 x10E3/uL   RBC 4.65 3.77 - 5.28 x10E6/uL   Hemoglobin 14.2 11.1 - 15.9 g/dL   Hematocrit 41.9 34.0 - 46.6 %   MCV 90 79 - 97 fL   MCH 30.5 26.6 -  33.0 pg   MCHC 33.9 31.5 - 35.7 g/dL   RDW 13.4 12.3 - 15.4 %   Platelets 399 (H) 150 - 379 x10E3/uL   Neutrophils 47 %   Lymphs 38 %   Monocytes 10 %   Eos 4 %   Basos 1 %   Neutrophils Absolute 3.5 1.4 - 7.0 x10E3/uL   Lymphocytes Absolute 2.8 0.7 - 3.1 x10E3/uL   Monocytes Absolute 0.8 0.1 - 0.9 x10E3/uL   EOS (ABSOLUTE) 0.3 0.0 - 0.4 x10E3/uL   Basophils Absolute 0.1 0.0 - 0.2 x10E3/uL   Immature Granulocytes 0 %   Immature Grans (Abs) 0.0 0.0 - 0.1 x10E3/uL  Hgb A1c w/o eAG  Result Value Ref Range   Hgb A1c MFr Bld 6.4 (H) 4.8 - 5.6 %  Specimen status report  Result Value Ref Range   specimen status report Comment       Assessment & Plan:   Problem List Items Addressed This Visit      Digestive   Acid reflux    Continue protonix as it seems to be controlling her symptoms. Advised to avoid dietary triggers.       Relevant Medications   RABEprazole (ACIPHEX) 20 MG tablet     Other   Anxiety    Clonazepam renewed today.       Relevant Medications   clonazePAM (KLONOPIN) 0.5 MG tablet   Prediabetes - Primary    A1c today is 6.4%- start metformin once daily to help reduce A1c and with weight loss. Pt will enter [facility] program to help prevent diabetes. Continue to reduce sodas.  Encouraged increasing exercise, reducing breads, rice, pasta.  Recheck 3 mos.       Family history of colon cancer    Refer to GI for colon cancer screening if insurance will cover based on family history.       Relevant Orders   Ambulatory referral to General  Surgery   Family history of ovarian cancer    Give strong family history- BrCAssure testing ordered today. Have discussed having pt see a genetics counselor to talk about her risk. Encouraged yearly mammograms to screen for breast cancer.  I spent >20 minutes face to face counseling about hereditary cancers and increased risk for cancer given family history.       Relevant Orders   BRCAssure Comprehensive Test      Meds ordered this encounter  Medications  . etodolac (LODINE) 500 MG tablet    Sig: Take by mouth.  . RABEprazole (ACIPHEX) 20 MG tablet    Sig: Take by mouth.  . DISCONTD: Icosapent Ethyl (VASCEPA) 1 g CAPS    Sig: Take by mouth.  . DISCONTD: oxymetazoline (AFRIN NASAL SPRAY) 0.05 % nasal spray    Sig: by Nasal route.  Marland Kitchen DISCONTD: pantoprazole (PROTONIX) 40 MG tablet    Sig: Take by mouth.  . metformin (FORTAMET) 500 MG (OSM) 24 hr tablet    Sig: Take 1 tablet (500 mg total) by mouth daily with breakfast.    Dispense:  90 tablet    Refill:  3    Order Specific Question:  Supervising Provider    Answer:  Arlis Porta [phone]  . clonazePAM (KLONOPIN) 0.5 MG tablet    Sig: Take 1 tablet (0.5 mg total) by mouth at bedtime.    Dispense:  90 tablet    Refill:  0    Order Specific Question:  Supervising Provider    Answer:  Philbert Riser,  Nikki Dom [id number]      Follow up plan: Return in about 3 months (around 03/06/2016) for Prediabetes. Anxiety.Marland Kitchen

## 2015-12-13 ENCOUNTER — Telehealth: Payer: Self-pay | Admitting: Family Medicine

## 2015-12-13 NOTE — Telephone Encounter (Signed)
Conform with [facility] pt does not need to fast and pt notified Nisha

## 2015-12-13 NOTE — Telephone Encounter (Signed)
Pt was given a lab order at last visit and asked if she needs to fast.  Please call [phone]

## 2015-12-17 ENCOUNTER — Telehealth: Payer: Self-pay | Admitting: Family Medicine

## 2015-12-17 MED ORDER — METFORMIN HCL ER (OSM) 500 MG PO TB24: 500.0000 mg | ORAL_TABLET | Freq: Every day | ORAL | Status: DC

## 2015-12-17 NOTE — Telephone Encounter (Signed)
no

## 2015-12-17 NOTE — Telephone Encounter (Signed)
Have you gotten a PA for this twice daily medication? Thanks! AK

## 2015-12-17 NOTE — Telephone Encounter (Signed)
Pt called states that her Metformin was not called in  To  Her  [facility], she also states that she was not sure the dose for this medication, pt also said that the medication Protonix  40 mg need a prior  Auth that # [id number] have request that pt call drug store and have them to  fax a prior [place]

## 2015-12-17 NOTE — Telephone Encounter (Signed)
Pt called states that  Her  Protonix 40 mg need a prior Auth, have request that she call ins  company and to fax info, she also states that her metformin was not called in to  [facility]  And she did not have doses amount she needed to take. Pt call back # is  [phone].

## 2015-12-21 ENCOUNTER — Encounter: Payer: Self-pay | Admitting: Anesthesiology

## 2015-12-21 ENCOUNTER — Encounter: Payer: Self-pay | Admitting: *Deleted

## 2015-12-25 NOTE — Discharge Instructions (Signed)

## 2015-12-27 ENCOUNTER — Telehealth: Payer: Self-pay | Admitting: Family Medicine

## 2015-12-27 NOTE — Telephone Encounter (Signed)
Called pt to discuss genetic testing- she has test number at work- she will call me back with test number tomorrow.   Pt had a question about metformin- making her nauseated. Advised to take with largest meal of the day.

## 2015-12-27 NOTE — Telephone Encounter (Signed)
Pt  Called stats that insurance will pay for labs for cancer screening , so she said she wanted to have labs instead of  an colonoscopy. Pt call back # is  [phone]

## 2015-12-28 ENCOUNTER — Ambulatory Visit: Admission: RE | Admit: 2015-12-28 | Payer: 59 | Source: Ambulatory Visit | Admitting: Gastroenterology

## 2015-12-28 HISTORY — DX: Personal history of other venous thrombosis and embolism: Z86.718

## 2015-12-28 LAB — COMPREHENSIVE BRCA1/2 ANALYSIS

## 2015-12-28 LAB — BRCASSURE COMPREHENSIVE TEST

## 2015-12-28 SURGERY — COLONOSCOPY WITH PROPOFOL
Anesthesia: Choice

## 2016-02-22 ENCOUNTER — Other Ambulatory Visit: Payer: Self-pay | Admitting: Family Medicine

## 2016-02-22 DIAGNOSIS — F419 Anxiety disorder, unspecified: Secondary | ICD-10-CM

## 2016-02-22 DIAGNOSIS — K219 Gastro-esophageal reflux disease without esophagitis: Secondary | ICD-10-CM

## 2016-02-22 MED ORDER — LISINOPRIL 20 MG PO TABS: 20.0000 mg | ORAL_TABLET | Freq: Every day | ORAL | Status: DC

## 2016-02-22 MED ORDER — PANTOPRAZOLE SODIUM 40 MG PO TBEC: 40.0000 mg | DELAYED_RELEASE_TABLET | Freq: Two times a day (BID) | ORAL | Status: DC

## 2016-02-22 MED ORDER — VENLAFAXINE HCL ER 37.5 MG PO CP24: ORAL_CAPSULE | ORAL | Status: DC

## 2016-02-27 ENCOUNTER — Other Ambulatory Visit: Payer: Self-pay | Admitting: Family Medicine

## 2016-02-27 DIAGNOSIS — F419 Anxiety disorder, unspecified: Secondary | ICD-10-CM

## 2016-03-07 ENCOUNTER — Ambulatory Visit (INDEPENDENT_AMBULATORY_CARE_PROVIDER_SITE_OTHER): Payer: 59 | Admitting: Family Medicine

## 2016-03-07 VITALS — BP 119/79 | HR 93 | Temp 98.2°F | Resp 16 | Ht 62.0 in | Wt 188.0 lb

## 2016-03-07 DIAGNOSIS — IMO0001 Reserved for inherently not codable concepts without codable children: Secondary | ICD-10-CM

## 2016-03-07 DIAGNOSIS — I10 Essential (primary) hypertension: Secondary | ICD-10-CM

## 2016-03-07 DIAGNOSIS — R7303 Prediabetes: Secondary | ICD-10-CM | POA: Diagnosis not present

## 2016-03-07 DIAGNOSIS — E119 Type 2 diabetes mellitus without complications: Secondary | ICD-10-CM

## 2016-03-07 DIAGNOSIS — F419 Anxiety disorder, unspecified: Secondary | ICD-10-CM

## 2016-03-07 DIAGNOSIS — E781 Pure hyperglyceridemia: Secondary | ICD-10-CM

## 2016-03-07 LAB — POCT GLYCOSYLATED HEMOGLOBIN (HGB A1C): Hemoglobin A1C: 6.5

## 2016-03-07 MED ORDER — CLONAZEPAM 0.5 MG PO TABS: 0.5000 mg | ORAL_TABLET | Freq: Every day | ORAL | Status: DC

## 2016-03-07 MED ORDER — METFORMIN HCL 500 MG PO TABS: 500.0000 mg | ORAL_TABLET | Freq: Two times a day (BID) | ORAL | Status: DC

## 2016-03-07 MED ORDER — VENLAFAXINE HCL ER 75 MG PO CP24: 75.0000 mg | ORAL_CAPSULE | Freq: Every day | ORAL | Status: DC

## 2016-03-07 NOTE — Progress Notes (Signed)
Subjective:    Patient ID: Courtney King, female    DOB: 1966-04-25, 50 y.o.   MRN: [id number]  HPI: Courtney King is a 50 y.o. female presenting on 03/07/2016 for Diabetes   HPI  Pt presents for follow-up of prediabetes, hypertension, and anxiety.   Anxiety: Pt is doing well but Mom had cancer and she reports difficulty quieting her thoughts at night. Has been taking 2 clonazepams at bedtimes some nights due to inability to quiet thoughts. Work is very stressful. Lots of changes at her job. Take effexor 37.5mg  daily for anxiety.  Prediabetes- gained 3lbs since last visit. Has been taking metformin 500 once daily. No numbness or tingling in her feet. No visual changes. Thinks weight gain could be coffee creamer. Doesn't eat supper- typical breakfast, greek yogurt and a cup for fruit. Eats at work at 930. Lunch at 2pm- eats stouffers. No regular exercise. Is feeling mentally drained with mother's illness. Work has been very stressful and she has not been able to walk.  BP well controlled. No chest pain, no visual changes. No dizziness.   Past Medical History  Diagnosis Date  . Allergy   . Hyperlipidemia   . Hypertension   . GERD (gastroesophageal reflux disease)   . Anxiety   . Left leg claudication ([place])   . 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 aspirin EC 81 MG tablet Take 81 mg by mouth at bedtime.  . fluticasone (FLONASE) 50 MCG/ACT nasal spray Place 2 sprays into the nose daily as needed for allergies.   Vanessa Kick Ethyl (VASCEPA) 1 G CAPS Take 2 capsules by mouth 2 (two) times daily. (Patient taking differently: Take 1 capsule by mouth 2 (two) times daily. )  . lisinopril (PRINIVIL,ZESTRIL) 20 MG tablet Take 1 tablet (20 mg total) by mouth at bedtime.  . meloxicam (MOBIC) 15 MG tablet Take 1 tablet (15 mg total) by mouth daily. (Patient taking differently: Take 15 mg  by mouth as needed for pain. )  . oxymetazoline (AFRIN NASAL SPRAY) 0.05 % nasal spray Place 1 spray into both nostrils 2 (two) times daily.  . pantoprazole (PROTONIX) 40 MG tablet Take 1 tablet (40 mg total) by mouth 2 (two) times daily.  . RABEprazole (ACIPHEX) 20 MG tablet Take by mouth.   No current facility-administered medications on file prior to visit.    Review of Systems  Constitutional: Negative for fever and chills.  HENT: Negative.   Respiratory: Negative for cough, chest tightness and wheezing.   Cardiovascular: Negative for chest pain and leg swelling.  Gastrointestinal: Negative for nausea, vomiting, abdominal pain, diarrhea and constipation.  Endocrine: Negative.  Negative for cold intolerance, heat intolerance, polydipsia, polyphagia and polyuria.  Genitourinary: Negative for dysuria and difficulty urinating.  Musculoskeletal: Negative.   Neurological: Negative for dizziness, light-headedness and numbness.  Psychiatric/Behavioral: Positive for sleep disturbance. Negative for suicidal ideas. The patient is nervous/anxious.    Per HPI unless specifically indicated above     Objective:    BP 119/79 mmHg  Pulse 93  Temp(Src) 98.2 F (36.8 C) (Oral)  Resp 16  Ht 5\' 2"  (1.575 m)  Wt 188 lb (85.276 kg)  BMI 34.38 kg/m2  Wt Readings from Last 3 Encounters:  03/07/16 188 lb (85.276 kg)  12/07/15 185 lb 3.2 oz (84.006 kg)  12/21/15 185 lb (83.915 kg)  GAD 7 : Generalized Anxiety Score 03/07/2016 11/02/2015  Nervous, Anxious, on Edge 1 1  Control/stop worrying 1 1  Worry too much - different things 3 2  Trouble relaxing 3 2  Restless 1 1  Easily annoyed or irritable 1 1  Afraid - awful might happen 2 2  Total GAD 7 Score 12 10  Anxiety Difficulty Somewhat difficult Not difficult at all      Physical Exam  Constitutional: She is oriented to person, place, and time. She appears well-developed and well-nourished.  HENT:  Head: Normocephalic and atraumatic.    Neck: Neck supple.  Cardiovascular: Normal rate, regular rhythm and normal heart sounds.  Exam reveals no gallop and no friction rub.   No murmur heard. Pulmonary/Chest: Effort normal and breath sounds normal. She has no wheezes. She exhibits no tenderness.  Abdominal: Soft. Normal appearance and bowel sounds are normal. She exhibits no distension and no mass. There is no tenderness. There is no rebound and no guarding.  Musculoskeletal: Normal range of motion. She exhibits no edema or tenderness.  Lymphadenopathy:    She has no cervical adenopathy.  Neurological: She is alert and oriented to person, place, and time.  Skin: Skin is warm and dry.   Results for orders placed or performed in visit on 03/07/16  POCT HgB A1C  Result Value Ref Range   Hemoglobin A1C 6.5       Assessment & Plan:   Problem List Items Addressed This Visit      Cardiovascular and Mediastinum   Hypertension    Well controlled. Continue current regimen. Recheck 3 mos.       Relevant Orders   Basic Metabolic Panel (BMET)     Other   Obesity, Class II, BMI 35-39.9, with comorbidity (HCC)    Encouraged daily exercise and healthy lifestyle changes. Pt declined referral to lifestyles center today.       Relevant Medications   metFORMIN (GLUCOPHAGE) 500 MG tablet   Anxiety    Increase effexor to 75mg  once daily to help with anxiety symptoms. Check BMET, TSH, vitamin D, and b12 for exacerbating causes.  Encouraged daily exercise. Encouraged pt to seek counseling or support group for mother's illness.       Relevant Medications   venlafaxine XR (EFFEXOR-XR) 75 MG 24 hr capsule   clonazePAM (KLONOPIN) 0.5 MG tablet   Other Relevant Orders   TSH   VITAMIN D 25 Hydroxy (Vit-D Deficiency, Fractures)   B12 and Folate Panel   Hypertriglyceridemia    Continue current regimen and recheck lipids. Recheck 3 mos.       Relevant Orders   Lipid Profile   Prediabetes    A1c is elevated. Increase metformin to  BID. Encouraged diet and lifestyle changes. Encouraged daily exercise- 10 minutes after each meal. Encouraged eating 3 meals with protein and reducing carb heavy foods in diet.  Pt declined referral to lifestyles center.  Repeat A1c in 3 mos.       Relevant Medications   metFORMIN (GLUCOPHAGE) 500 MG tablet   Other Relevant Orders   POCT HgB A1C (Completed)    Other Visit Diagnoses    Type 2 diabetes mellitus without complication, unspecified long term insulin use status (HCC)    -  Primary    Relevant Medications    metFORMIN (GLUCOPHAGE) 500 MG tablet       Meds ordered this encounter  Medications  . venlafaxine XR (EFFEXOR-XR) 75 MG 24 hr capsule  Sig: Take 1 capsule (75 mg total) by mouth daily with breakfast.    Dispense:  90 capsule    Refill:  3    Order Specific Question:  Supervising Provider    Answer:  Arlis Porta [phone]  . metFORMIN (GLUCOPHAGE) 500 MG tablet    Sig: Take 1 tablet (500 mg total) by mouth 2 (two) times daily with a meal.    Dispense:  180 tablet    Refill:  3    Order Specific Question:  Supervising Provider    Answer:  Arlis Porta [phone]  . clonazePAM (KLONOPIN) 0.5 MG tablet    Sig: Take 1 tablet (0.5 mg total) by mouth at bedtime.    Dispense:  90 tablet    Refill:  0    Faxed to [facility]    Order Specific Question:  Supervising Provider    Answer:  Arlis Porta [id number]      Follow up plan: Return in about 4 weeks (around 04/04/2016) for anxiety. Marland Kitchen

## 2016-03-07 NOTE — Assessment & Plan Note (Signed)
A1c is elevated. Increase metformin to BID. Encouraged diet and lifestyle changes. Encouraged daily exercise- 10 minutes after each meal. Encouraged eating 3 meals with protein and reducing carb heavy foods in diet.  Pt declined referral to lifestyles center.  Repeat A1c in 3 mos.

## 2016-03-07 NOTE — Assessment & Plan Note (Addendum)
Well controlled. Continue current regimen. Recheck 3 mos.

## 2016-03-07 NOTE — Patient Instructions (Signed)
Prediabetes:  Let's increase your metformin to 500mg  twice daily. Try eating 3 meals per day. Make sure you have protein with each meal.  Try exercising daily- 10 minutes after each meal.   Anxiety: Let's try increasing your effexor to 75mg .  I do recommend seeking counseling for your stress or a support group for your mother's illness.

## 2016-03-07 NOTE — Assessment & Plan Note (Signed)
Increase effexor to 75mg  once daily to help with anxiety symptoms. Check BMET, TSH, vitamin D, and b12 for exacerbating causes.  Encouraged daily exercise. Encouraged pt to seek counseling or support group for mother's illness.

## 2016-03-07 NOTE — Assessment & Plan Note (Signed)
Encouraged daily exercise and healthy lifestyle changes. Pt declined referral to lifestyles center today.

## 2016-03-07 NOTE — Assessment & Plan Note (Signed)
Continue current regimen and recheck lipids. Recheck 3 mos.

## 2016-03-12 ENCOUNTER — Telehealth: Payer: Self-pay | Admitting: Family Medicine

## 2016-03-12 MED ORDER — ONETOUCH LANCETS MISC
Status: DC
Start: 2016-03-12 — End: 2018-06-21

## 2016-03-12 MED ORDER — GLUCOSE BLOOD VI STRP
ORAL_STRIP | Status: DC
Start: 2016-03-12 — End: 2018-06-21

## 2016-03-12 MED ORDER — ONETOUCH VERIO W/DEVICE KIT: 1.0000 | PACK | Freq: Once | Status: DC

## 2016-03-12 NOTE — Telephone Encounter (Signed)
Pt is very concerned that her sugar is running high.  Her mouth has been very dry and she is dizzy.  Please call [phone]

## 2016-03-12 NOTE — Telephone Encounter (Signed)
Patient call and was a little anxious about getting a meter. She called her insurance and they cover one touch Verio. Meter,strips and lancets have been sent to pharmacy.

## 2016-03-12 NOTE — Telephone Encounter (Signed)
Please advise 

## 2016-03-13 NOTE — Telephone Encounter (Signed)
Please apologize to her as I did not get this message before I left yesterday: I agree with getting her a meter just for monitoring. We did just increase her metformin to BID- it can cause some dizziness. High sugars usually do not cause the symptoms she is reporting. I am thinking it is the increased dose of Effexor- the effexor is know to cause dry mouth. She can overcome this with (sugar free) hard candy, drinking lots of water, and a mouthwash over the counter called Biotene.  It should go away with more time on the higher dose.  Thanks! AK

## 2016-03-13 NOTE — Telephone Encounter (Signed)
Left detailed message as listed below

## 2016-03-18 ENCOUNTER — Other Ambulatory Visit: Payer: Self-pay | Admitting: Family Medicine

## 2016-03-18 DIAGNOSIS — E781 Pure hyperglyceridemia: Secondary | ICD-10-CM

## 2016-03-18 LAB — BASIC METABOLIC PANEL
BUN / CREAT RATIO: 16 (ref 9–23)
BUN: 14 mg/dL (ref 6–24)
CHLORIDE: 101 mmol/L (ref 96–106)
CO2: 20 mmol/L (ref 18–29)
Calcium: 9.9 mg/dL (ref 8.7–10.2)
Creatinine, Ser: 0.85 mg/dL (ref 0.57–1.00)
GFR calc non Af Amer: 81 mL/min/{1.73_m2} (ref >59)
GFR, EST AFRICAN AMERICAN: 93 mL/min/{1.73_m2} (ref >59)
GLUCOSE: 124 mg/dL — AB (ref 65–99)
POTASSIUM: 4.4 mmol/L (ref 3.5–5.2)
SODIUM: 141 mmol/L (ref 134–144)

## 2016-03-18 LAB — LIPID PANEL
CHOL/HDL RATIO: 6.8 ratio — AB (ref 0.0–4.4)
Cholesterol, Total: 198 mg/dL (ref 100–199)
HDL: 29 mg/dL — AB (ref >39)
LDL CALC: 103 mg/dL — AB (ref 0–99)
TRIGLYCERIDES: 329 mg/dL — AB (ref 0–149)
VLDL CHOLESTEROL CAL: 66 mg/dL — AB (ref 5–40)

## 2016-03-18 LAB — B12 AND FOLATE PANEL
Folate: 5.1 ng/mL (ref >3.0)
Vitamin B-12: 316 pg/mL (ref 211–946)

## 2016-03-18 LAB — VITAMIN D 25 HYDROXY (VIT D DEFICIENCY, FRACTURES): Vit D, 25-Hydroxy: 20.4 ng/mL — ABNORMAL LOW (ref 30.0–100.0)

## 2016-03-18 LAB — TSH: TSH: 1.15 u[IU]/mL (ref 0.450–4.500)

## 2016-03-18 MED ORDER — GEMFIBROZIL 600 MG PO TABS: 600.0000 mg | ORAL_TABLET | Freq: Two times a day (BID) | ORAL | Status: DC

## 2016-03-21 ENCOUNTER — Other Ambulatory Visit: Payer: Self-pay | Admitting: Family Medicine

## 2016-03-21 MED ORDER — VITAMIN D (ERGOCALCIFEROL) 1.25 MG (50000 UNIT) PO CAPS: 50000.0000 [IU] | ORAL_CAPSULE | ORAL | Status: DC

## 2016-04-04 ENCOUNTER — Ambulatory Visit (INDEPENDENT_AMBULATORY_CARE_PROVIDER_SITE_OTHER): Payer: 59 | Admitting: Family Medicine

## 2016-04-04 VITALS — BP 101/56 | HR 84 | Temp 98.3°F | Resp 16 | Ht 62.0 in | Wt 176.0 lb

## 2016-04-04 DIAGNOSIS — F419 Anxiety disorder, unspecified: Secondary | ICD-10-CM | POA: Diagnosis not present

## 2016-04-04 DIAGNOSIS — I10 Essential (primary) hypertension: Secondary | ICD-10-CM

## 2016-04-04 DIAGNOSIS — L739 Follicular disorder, unspecified: Secondary | ICD-10-CM

## 2016-04-04 DIAGNOSIS — R7303 Prediabetes: Secondary | ICD-10-CM

## 2016-04-04 MED ORDER — LISINOPRIL 20 MG PO TABS: 10.0000 mg | ORAL_TABLET | Freq: Every day | ORAL | Status: DC

## 2016-04-04 MED ORDER — DOXYCYCLINE HYCLATE 100 MG PO TABS: 100.0000 mg | ORAL_TABLET | Freq: Two times a day (BID) | ORAL | Status: DC

## 2016-04-04 NOTE — Patient Instructions (Signed)
Your blood pressure is running low today. Cut your lisinopril in 1/2 and check BP at home. If > 140/90 take a full tablet.   Boils: I think they could be a skin infection. We will try treating with doxycycline twice daily for 10 days. Please let me know if they don't resolve.

## 2016-04-04 NOTE — Progress Notes (Signed)
Subjective:    Patient ID: Courtney King, female    DOB: 01/10/1966, 50 y.o.   MRN: [id number]  HPI: Courtney King is a 50 y.o. female presenting on 04/04/2016 for Anxiety and Diabetes   HPI  Pt presents for follow-up of anxiety and diabetes. Highest sugar 135. Average sugar is 110. Tolerating metformin BID with no diarrhea. Has cut out sugar and carbohydrates. Lost 12lbs since last visit.  Increased dose of effexor- feels like it is helping. Anxiety is reduced.  BP is low today. Does have a HA. No dizziness. Having issues with red bumps that pop up and are painful and then drain pus. Several bumps on chest, arms, and back over past 3 weeks. No pattern. Drain on their own and then scab over. No itching.   Past Medical History  Diagnosis Date  . Allergy   . Hyperlipidemia   . Hypertension   . GERD (gastroesophageal reflux disease)   . Anxiety   . Left leg claudication ([place])   . H/O blood clots 2014    in abdominal aorta - endarterectomy at [facility]    Current Outpatient Prescriptions on File Prior to Visit  Medication Sig  . aspirin EC 81 MG tablet Take 81 mg by mouth at bedtime.  . Blood Glucose Monitoring Suppl (ONETOUCH VERIO) w/Device KIT 1 each by Does not apply route once.  . clonazePAM (KLONOPIN) 0.5 MG tablet Take 1 tablet (0.5 mg total) by mouth at bedtime.  . fluticasone (FLONASE) 50 MCG/ACT nasal spray Place 2 sprays into the nose daily as needed for allergies.   Marland Kitchen gemfibrozil (LOPID) 600 MG tablet Take 1 tablet (600 mg total) by mouth 2 (two) times daily before a meal.  . glucose blood (ONETOUCH VERIO) test strip Check blood sugar once daily.  . meloxicam (MOBIC) 15 MG tablet Take 1 tablet (15 mg total) by mouth daily. (Patient taking differently: Take 15 mg by mouth as needed for pain. )  . metFORMIN (GLUCOPHAGE) 500 MG tablet Take 1 tablet (500 mg total) by mouth 2 (two) times daily with a meal.  . ONE TOUCH LANCETS MISC Check blood sugar daily.  Marland Kitchen oxymetazoline  (AFRIN NASAL SPRAY) 0.05 % nasal spray Place 1 spray into both nostrils 2 (two) times daily.  . pantoprazole (PROTONIX) 40 MG tablet Take 1 tablet (40 mg total) by mouth 2 (two) times daily.  Marland Kitchen venlafaxine XR (EFFEXOR-XR) 75 MG 24 hr capsule Take 1 capsule (75 mg total) by mouth daily with breakfast.  . Vitamin D, Ergocalciferol, (DRISDOL) 50000 units CAPS capsule Take 1 capsule (50,000 Units total) by mouth every 7 (seven) days.   No current facility-administered medications on file prior to visit.    Review of Systems  Constitutional: Negative for fever and chills.  Respiratory: Negative for cough, chest tightness and wheezing.   Cardiovascular: Negative for chest pain and leg swelling.  Gastrointestinal: Negative for nausea, vomiting, abdominal pain, diarrhea and constipation.  Endocrine: Negative.  Negative for cold intolerance, heat intolerance, polydipsia, polyphagia and polyuria.  Genitourinary: Negative for dysuria and difficulty urinating.  Musculoskeletal: Negative.   Skin: Positive for rash and wound.  Neurological: Positive for headaches. Negative for dizziness, light-headedness and numbness.  Psychiatric/Behavioral: Negative.  Negative for suicidal ideas and sleep disturbance. The patient is not nervous/anxious.    Per HPI unless specifically indicated above     Objective:    BP 101/56 mmHg  Pulse 84  Temp(Src) 98.3 F (36.8 C) (Oral)  Resp 16  Ht 5' 2"  (1.575 m)  Wt 176 lb (79.833 kg)  BMI 32.18 kg/m2  Wt Readings from Last 3 Encounters:  04/04/16 176 lb (79.833 kg)  03/07/16 188 lb (85.276 kg)  12/07/15 185 lb 3.2 oz (84.006 kg)    Physical Exam  Constitutional: She is oriented to person, place, and time. She appears well-developed and well-nourished.  HENT:  Head: Normocephalic and atraumatic.  Neck: Neck supple.  Cardiovascular: Normal rate, regular rhythm and normal heart sounds.  Exam reveals no gallop and no friction rub.   No murmur  heard. Pulmonary/Chest: Effort normal and breath sounds normal. She has no wheezes. She exhibits no tenderness.  Abdominal: Soft. Normal appearance and bowel sounds are normal. She exhibits no distension and no mass. There is no tenderness. There is no rebound and no guarding.  Musculoskeletal: Normal range of motion. She exhibits no edema or tenderness.  Lymphadenopathy:    She has no cervical adenopathy.  Neurological: She is alert and oriented to person, place, and time.  Skin: Skin is warm and dry. Rash noted. Rash is pustular.  Multiple erythematous pustules on chest, neck, back. Various stages of healing. Some ulcerated. Not pruritic.    Results for orders placed or performed in visit on 03/07/16  Lipid Profile  Result Value Ref Range   Cholesterol, Total 198 100 - 199 mg/dL   Triglycerides 329 (H) 0 - 149 mg/dL   HDL 29 (L) >39 mg/dL   VLDL Cholesterol Cal 66 (H) 5 - 40 mg/dL   LDL Calculated 103 (H) 0 - 99 mg/dL   Chol/HDL Ratio 6.8 (H) 0.0 - 4.4 ratio units  Basic Metabolic Panel (BMET)  Result Value Ref Range   Glucose 124 (H) 65 - 99 mg/dL   BUN 14 6 - 24 mg/dL   Creatinine, Ser 0.85 0.57 - 1.00 mg/dL   GFR calc non Af Amer 81 >59 mL/min/1.73   GFR calc Af Amer 93 >59 mL/min/1.73   BUN/Creatinine Ratio 16 9 - 23   Sodium 141 134 - 144 mmol/L   Potassium 4.4 3.5 - 5.2 mmol/L   Chloride 101 96 - 106 mmol/L   CO2 20 18 - 29 mmol/L   Calcium 9.9 8.7 - 10.2 mg/dL  TSH  Result Value Ref Range   TSH 1.150 0.450 - 4.500 uIU/mL  VITAMIN D 25 Hydroxy (Vit-D Deficiency, Fractures)  Result Value Ref Range   Vit D, 25-Hydroxy 20.4 (L) 30.0 - 100.0 ng/mL  B12 and Folate Panel  Result Value Ref Range   Vitamin B-12 316 211 - 946 pg/mL   Folate 5.1 >3.0 ng/mL  POCT HgB A1C  Result Value Ref Range   Hemoglobin A1C 6.5       Assessment & Plan:   Problem List Items Addressed This Visit      Cardiovascular and Mediastinum   Hypertension - Primary    BP low. Reduce  lisinopril to 1/2 tablet. Pt to monitor closely at home. Recheck 2 mos.       Relevant Medications   lisinopril (PRINIVIL,ZESTRIL) 20 MG tablet     Other   Anxiety    Much improved with increase effexor. Continue current dosing.       Prediabetes    Tolerating metformin well. 12lb weight loss- continue current diet and exercise changes. Commended patient on her lifestyle changes.  Recheck A1c in 2 mos.        Other Visit Diagnoses    Folliculitis  Treat with Doxy BID for 10 days. Consider CA-MRSA. Consider derm referral if not improved.     Relevant Medications    doxycycline (VIBRA-TABS) 100 MG tablet       Meds ordered this encounter  Medications  . doxycycline (VIBRA-TABS) 100 MG tablet    Sig: Take 1 tablet (100 mg total) by mouth 2 (two) times daily.    Dispense:  20 tablet    Refill:  0    Order Specific Question:  Supervising Provider    Answer:  Arlis Porta [phone]  . lisinopril (PRINIVIL,ZESTRIL) 20 MG tablet    Sig: Take 0.5 tablets (10 mg total) by mouth at bedtime.    Dispense:  90 tablet    Refill:  3    Order Specific Question:  Supervising Provider    Answer:  Arlis Porta [[id number]]      Follow up plan: Return in about 2 months (around 06/07/2016), or if symptoms worsen or fail to improve, for Prediabetes. Marland Kitchen

## 2016-04-04 NOTE — Assessment & Plan Note (Signed)
Much improved with increase effexor. Continue current dosing.

## 2016-04-04 NOTE — Assessment & Plan Note (Signed)
Tolerating metformin well. 12lb weight loss- continue current diet and exercise changes. Commended patient on her lifestyle changes.  Recheck A1c in 2 mos.

## 2016-04-04 NOTE — Assessment & Plan Note (Signed)
BP low. Reduce lisinopril to 1/2 tablet. Pt to monitor closely at home. Recheck 2 mos.

## 2016-06-12 ENCOUNTER — Telehealth: Payer: Self-pay | Admitting: Family Medicine

## 2016-06-12 NOTE — Telephone Encounter (Signed)
Pt's appointment schedule for 08/04 @ 1:00 pm

## 2016-06-12 NOTE — Telephone Encounter (Signed)
  She would need an appt. With Amy for this.  I will be glad to see if infection seems to be getting worse, but Amy needs to decide about Klonipin.-jh

## 2016-06-12 NOTE — Telephone Encounter (Signed)
Can she get this refill or does she need appointment ?

## 2016-06-12 NOTE — Telephone Encounter (Signed)
Pt needs refills of doxycycline and clonazepam sent to [facility].  Please call and let pt know if it was sent [phone]

## 2016-06-12 NOTE — Telephone Encounter (Signed)
Called pt but number given was just ringing no VM set up.

## 2016-06-20 ENCOUNTER — Ambulatory Visit (INDEPENDENT_AMBULATORY_CARE_PROVIDER_SITE_OTHER): Payer: 59 | Admitting: Family Medicine

## 2016-06-20 ENCOUNTER — Other Ambulatory Visit: Payer: Self-pay | Admitting: Family Medicine

## 2016-06-20 ENCOUNTER — Telehealth: Payer: Self-pay | Admitting: Family Medicine

## 2016-06-20 VITALS — BP 138/64 | HR 88 | Temp 98.3°F | Resp 16 | Ht 62.0 in | Wt 163.0 lb

## 2016-06-20 DIAGNOSIS — F419 Anxiety disorder, unspecified: Secondary | ICD-10-CM | POA: Diagnosis not present

## 2016-06-20 DIAGNOSIS — E781 Pure hyperglyceridemia: Secondary | ICD-10-CM | POA: Diagnosis not present

## 2016-06-20 DIAGNOSIS — Z1239 Encounter for other screening for malignant neoplasm of breast: Secondary | ICD-10-CM | POA: Diagnosis not present

## 2016-06-20 DIAGNOSIS — R238 Other skin changes: Secondary | ICD-10-CM | POA: Diagnosis not present

## 2016-06-20 DIAGNOSIS — I10 Essential (primary) hypertension: Secondary | ICD-10-CM | POA: Diagnosis not present

## 2016-06-20 DIAGNOSIS — R7303 Prediabetes: Secondary | ICD-10-CM | POA: Diagnosis not present

## 2016-06-20 LAB — POCT GLYCOSYLATED HEMOGLOBIN (HGB A1C): Hemoglobin A1C: 5.5

## 2016-06-20 MED ORDER — CLOBETASOL PROPIONATE 0.05 % EX CREA: 1.0000 "application " | TOPICAL_CREAM | Freq: Two times a day (BID) | CUTANEOUS | 0 refills | Status: DC

## 2016-06-20 MED ORDER — RANITIDINE HCL 150 MG PO TABS: 150.0000 mg | ORAL_TABLET | Freq: Two times a day (BID) | ORAL | Status: DC

## 2016-06-20 MED ORDER — CLONAZEPAM 0.5 MG PO TABS: 0.5000 mg | ORAL_TABLET | Freq: Every day | ORAL | 0 refills | Status: DC

## 2016-06-20 NOTE — Patient Instructions (Addendum)
We will culture the sores to determine what is causing them and wait for dermatology referral.  Try Zyrtec in the AM and Zantac and benadryl in the evenings for the sores. Apply new steroid cream.   Your goal blood pressure is 140/90 Work on low salt/sodium diet - goal <1.5gm (1,500mg ) per day. Eat a diet high in fruits/vegetables and whole grains.  Look into mediterranean and DASH diet. Goal activity is 132min/wk of moderate intensity exercise.  This can be split into 30 minute chunks.  If you are not at this level, you can start with smaller 10-15 min increments and slowly build up activity. Look at [employer] for more resources

## 2016-06-20 NOTE — Assessment & Plan Note (Signed)
Doing well with effexor. Renew PRN clonazepam at bedtime.

## 2016-06-20 NOTE — Telephone Encounter (Signed)
Pt said she was prediabetic but today it was 5.5.  She asked if that is considered prediabetic too.  Her call back number is [phone]

## 2016-06-20 NOTE — Assessment & Plan Note (Signed)
Check lipid panel to determine efficacy of gemfibrozil.

## 2016-06-20 NOTE — Assessment & Plan Note (Signed)
Controlled. Continue current regimen. 

## 2016-06-20 NOTE — Progress Notes (Signed)
Subjective:    Patient ID: Courtney King, female    DOB: [birth year], 50 y.o.   MRN: [id number]  HPI: Courtney King is a 50 y.o. female presenting on 06/20/2016 for Rash (went to [place] made rash goes down on face but still has sore ) and Diabetes (highest BS 92 and lowest 91)   HPI  Pt presents for prediabetes follow-up.  Overall doing well. Sugars are controlled at home. Sores on face and arms from last visit- never healed. Went to urgent care- was tested for HIV- negative. Awaiting dermatology referral.  Prediabetes- down 25lbs. A1c is 5.5%. Doing well at home. Still vaping. Will need appeal for [facility] insurance.   Past Medical History:  Diagnosis Date  . Allergy   . Anxiety   . GERD (gastroesophageal reflux disease)   . 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 Hyperlipidemia   . Hypertension   . Left leg claudication [facility])     Current Outpatient Prescriptions on File Prior to Visit  Medication Sig  . aspirin EC 81 MG tablet Take 81 mg by mouth at bedtime.  . Blood Glucose Monitoring Suppl (ONETOUCH VERIO) w/Device KIT 1 each by Does not apply route once.  . doxycycline (VIBRA-TABS) 100 MG tablet Take 1 tablet (100 mg total) by mouth 2 (two) times daily.  . fluticasone (FLONASE) 50 MCG/ACT nasal spray Place 2 sprays into the nose daily as needed for allergies.   Marland Kitchen gemfibrozil (LOPID) 600 MG tablet Take 1 tablet (600 mg total) by mouth 2 (two) times daily before a meal.  . glucose blood (ONETOUCH VERIO) test strip Check blood sugar once daily.  Marland Kitchen lisinopril (PRINIVIL,ZESTRIL) 20 MG tablet Take 0.5 tablets (10 mg total) by mouth at bedtime.  . meloxicam (MOBIC) 15 MG tablet Take 1 tablet (15 mg total) by mouth daily. (Patient taking differently: Take 15 mg by mouth as needed for pain. )  . metFORMIN (GLUCOPHAGE) 500 MG tablet Take 1 tablet (500 mg total) by mouth 2 (two) times daily with a meal.  . ONE TOUCH LANCETS MISC Check blood sugar daily.  Marland Kitchen  oxymetazoline (AFRIN NASAL SPRAY) 0.05 % nasal spray Place 1 spray into both nostrils 2 (two) times daily.  . pantoprazole (PROTONIX) 40 MG tablet Take 1 tablet (40 mg total) by mouth 2 (two) times daily.  Marland Kitchen venlafaxine XR (EFFEXOR-XR) 75 MG 24 hr capsule Take 1 capsule (75 mg total) by mouth daily with breakfast.  . Vitamin D, Ergocalciferol, (DRISDOL) 50000 units CAPS capsule Take 1 capsule (50,000 Units total) by mouth every 7 (seven) days.   No current facility-administered medications on file prior to visit.     Review of Systems  Constitutional: Negative for chills and fever.  HENT: Negative.   Respiratory: Negative for cough, chest tightness and wheezing.   Cardiovascular: Negative for chest pain and leg swelling.  Gastrointestinal: Negative for abdominal pain, constipation, diarrhea, nausea and vomiting.  Endocrine: Negative.  Negative for cold intolerance, heat intolerance, polydipsia, polyphagia and polyuria.  Genitourinary: Negative for difficulty urinating and dysuria.  Musculoskeletal: Negative.   Skin: Positive for rash and wound.  Neurological: Negative for dizziness, light-headedness and numbness.  Psychiatric/Behavioral: Negative.    Per HPI unless specifically indicated above     Objective:    BP 138/64 (Cuff Size: Large)   Pulse 88   Temp 98.3 F (36.8 C) (Oral)   Resp 16   Ht 5' 2"  (1.575 m)  Wt 163 lb (73.9 kg)   BMI 29.81 kg/m   Wt Readings from Last 3 Encounters:  06/20/16 163 lb (73.9 kg)  04/04/16 176 lb (79.8 kg)  03/07/16 188 lb (85.3 kg)    Physical Exam  Constitutional: She is oriented to person, place, and time. She appears well-developed and well-nourished.  HENT:  Head: Normocephalic and atraumatic.  Neck: Neck supple.  Cardiovascular: Normal rate, regular rhythm and normal heart sounds.  Exam reveals no gallop and no friction rub.   No murmur heard. Pulmonary/Chest: Effort normal and breath sounds normal. She has no wheezes. She  exhibits no tenderness.  Abdominal: Soft. Normal appearance and bowel sounds are normal. She exhibits no distension and no mass. There is no tenderness. There is no rebound and no guarding.  Musculoskeletal: Normal range of motion. She exhibits no edema or tenderness.  Lymphadenopathy:    She has no cervical adenopathy.  Neurological: She is alert and oriented to person, place, and time.  Skin: Skin is warm and dry. Lesion and rash noted. Rash is vesicular.  Scattered ulcerated lesions along the neck, behind ears, and arm.    Results for orders placed or performed in visit on 06/20/16  POCT HgB A1C  Result Value Ref Range   Hemoglobin A1C 5.5       Assessment & Plan:   Problem List Items Addressed This Visit      Cardiovascular and Mediastinum   Hypertension    Controlled. Continue current regimen.         Other   Anxiety    Doing well with effexor. Renew PRN clonazepam at bedtime.       Relevant Medications   clonazePAM (KLONOPIN) 0.5 MG tablet   Hypertriglyceridemia    Check lipid panel to determine efficacy of gemfibrozil.       Relevant Orders   Lipid Profile   Hepatic function panel   Prediabetes - Primary    Great improvement in HgA1c to 5.5. Commended pt on her efforts. Continue diet and lifestyle changes. Recheck 3 mos.       Relevant Orders   POCT HgB A1C (Completed)    Other Visit Diagnoses    Vesicular skin lesions       viral and bacterial cultures pending. HIV r/o at urgent care. Will plan to see if appt with dermatology can be moved up.  Clobetasol PRN.    Relevant Medications   clobetasol cream (TEMOVATE) 0.05 %   ranitidine (ZANTAC) 150 MG tablet   Other Relevant Orders   Wound culture   Herpes simplex virus culture   Ambulatory referral to Dermatology   Screening for breast cancer       Relevant Orders   MM Digital Screening      Meds ordered this encounter  Medications  . DISCONTD: triamcinolone ointment (KENALOG) 0.1 %    Sig: Apply  topically.  . clobetasol cream (TEMOVATE) 0.05 %    Sig: Apply 1 application topically 2 (two) times daily.    Dispense:  30 g    Refill:  0    Order Specific Question:   Supervising Provider    Answer:   Arlis Porta [[id number]]  . clonazePAM (KLONOPIN) 0.5 MG tablet    Sig: Take 1 tablet (0.5 mg total) by mouth at bedtime.    Dispense:  90 tablet    Refill:  0    Faxed to [facility]    Order Specific Question:   Supervising Provider  Answer:   Arlis Porta [[id number]]  . ranitidine (ZANTAC) 150 MG tablet    Sig: Take 1 tablet (150 mg total) by mouth 2 (two) times daily.    Order Specific Question:   Supervising Provider    Answer:   Arlis Porta [[id number]]      Follow up plan: Return in about 3 months (around 09/20/2016), or if symptoms worsen or fail to improve.

## 2016-06-20 NOTE — Telephone Encounter (Signed)
Patient question answered 5.5% is normal range.

## 2016-06-20 NOTE — Assessment & Plan Note (Signed)
Great improvement in HgA1c to 5.5. Commended pt on her efforts. Continue diet and lifestyle changes. Recheck 3 mos.

## 2016-06-23 LAB — HERPES SIMPLEX VIRUS CULTURE: ORGANISM ID, BACTERIA: NOT DETECTED

## 2016-06-23 LAB — WOUND CULTURE
GRAM STAIN: NONE SEEN
GRAM STAIN: NONE SEEN
Gram Stain: NONE SEEN

## 2016-06-24 ENCOUNTER — Other Ambulatory Visit: Payer: Self-pay | Admitting: Family Medicine

## 2016-06-24 MED ORDER — CEPHALEXIN 500 MG PO CAPS: 500.0000 mg | ORAL_CAPSULE | Freq: Two times a day (BID) | ORAL | 0 refills | Status: DC

## 2016-06-25 ENCOUNTER — Other Ambulatory Visit: Payer: Self-pay | Admitting: Family Medicine

## 2016-06-25 MED ORDER — CEPHALEXIN 500 MG PO CAPS: 500.0000 mg | ORAL_CAPSULE | Freq: Two times a day (BID) | ORAL | 0 refills | Status: DC

## 2016-07-05 LAB — BASIC METABOLIC PANEL: CREATININE: 0.7 mg/dL (ref <=1.1)

## 2016-07-05 LAB — LIPID PANEL
Cholesterol: 193 mg/dL (ref 0–200)
HDL: 30 mg/dL — AB (ref 35–70)
LDL CALC: 101 mg/dL
Triglycerides: 310 mg/dL — AB (ref 40–160)

## 2016-07-11 ENCOUNTER — Ambulatory Visit (INDEPENDENT_AMBULATORY_CARE_PROVIDER_SITE_OTHER): Payer: 59 | Admitting: Family Medicine

## 2016-07-11 ENCOUNTER — Encounter: Payer: Self-pay | Admitting: Family Medicine

## 2016-07-11 VITALS — BP 125/71 | HR 83 | Temp 98.0°F | Resp 16 | Ht 62.0 in | Wt 162.0 lb

## 2016-07-11 DIAGNOSIS — L298 Other pruritus: Secondary | ICD-10-CM

## 2016-07-11 DIAGNOSIS — F17200 Nicotine dependence, unspecified, uncomplicated: Secondary | ICD-10-CM

## 2016-07-11 DIAGNOSIS — F17290 Nicotine dependence, other tobacco product, uncomplicated: Secondary | ICD-10-CM

## 2016-07-11 DIAGNOSIS — Z720 Tobacco use: Secondary | ICD-10-CM | POA: Insufficient documentation

## 2016-07-11 DIAGNOSIS — E781 Pure hyperglyceridemia: Secondary | ICD-10-CM

## 2016-07-11 NOTE — Progress Notes (Signed)
Subjective:    Patient ID: Courtney King, female    DOB: [birth year], 50 y.o.   MRN: [id number]  HPI: Courtney King is a 50 y.o. female presenting on 07/11/2016 for Nicotine Dependence   HPI  Pt presents for [facility] appeal for her tobacco use. They are not allow to vape per the insurance policy. Will have higher rate on insurance if they have used cigarettes, e-cigarettes, or any tobacco products in 12 mos. Pt vapes daily. Pt was smoking tobacco- 2-3 PPD last year. She vapes to help her quit smoking. She has had the occasional cigarette when stressed.  Hypertriglyceridemia: Last 310.  She has appt for recurrent lesions on September 8.   Past Medical History:  Diagnosis Date  . Allergy   . Anxiety   . GERD (gastroesophageal reflux disease)   . 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 Hyperlipidemia   . Hypertension   . Left leg claudication [facility])     Current Outpatient Prescriptions on File Prior to Visit  Medication Sig  . aspirin EC 81 MG tablet Take 81 mg by mouth at bedtime.  . Blood Glucose Monitoring Suppl (ONETOUCH VERIO) w/Device KIT 1 each by Does not apply route once.  . cephALEXin (KEFLEX) 500 MG capsule Take 1 capsule (500 mg total) by mouth 2 (two) times daily.  . clobetasol cream (TEMOVATE) 7.58 % Apply 1 application topically 2 (two) times daily.  . clonazePAM (KLONOPIN) 0.5 MG tablet Take 1 tablet (0.5 mg total) by mouth at bedtime.  Marland Kitchen doxycycline (VIBRA-TABS) 100 MG tablet Take 1 tablet (100 mg total) by mouth 2 (two) times daily.  . fluticasone (FLONASE) 50 MCG/ACT nasal spray Place 2 sprays into the nose daily as needed for allergies.   Marland Kitchen gemfibrozil (LOPID) 600 MG tablet Take 1 tablet (600 mg total) by mouth 2 (two) times daily before a meal.  . glucose blood (ONETOUCH VERIO) test strip Check blood sugar once daily.  Marland Kitchen lisinopril (PRINIVIL,ZESTRIL) 20 MG tablet Take 0.5 tablets (10 mg total) by mouth at bedtime.  . meloxicam  (MOBIC) 15 MG tablet Take 1 tablet (15 mg total) by mouth daily. (Patient taking differently: Take 15 mg by mouth as needed for pain. )  . metFORMIN (GLUCOPHAGE) 500 MG tablet Take 1 tablet (500 mg total) by mouth 2 (two) times daily with a meal.  . ONE TOUCH LANCETS MISC Check blood sugar daily.  Marland Kitchen oxymetazoline (AFRIN NASAL SPRAY) 0.05 % nasal spray Place 1 spray into both nostrils 2 (two) times daily.  . pantoprazole (PROTONIX) 40 MG tablet Take 1 tablet (40 mg total) by mouth 2 (two) times daily.  . ranitidine (ZANTAC) 150 MG tablet Take 1 tablet (150 mg total) by mouth 2 (two) times daily.  Marland Kitchen venlafaxine XR (EFFEXOR-XR) 75 MG 24 hr capsule Take 1 capsule (75 mg total) by mouth daily with breakfast.  . Vitamin D, Ergocalciferol, (DRISDOL) 50000 units CAPS capsule Take 1 capsule by mouth  every 7 days   No current facility-administered medications on file prior to visit.     Review of Systems  Constitutional: Negative for chills and fever.  HENT: Negative.   Respiratory: Negative for cough, chest tightness and wheezing.   Cardiovascular: Negative for chest pain and leg swelling.  Gastrointestinal: Negative for abdominal pain, constipation, diarrhea, nausea and vomiting.  Endocrine: Negative.  Negative for cold intolerance, heat intolerance, polydipsia, polyphagia and polyuria.  Genitourinary: Negative for difficulty urinating  and dysuria.  Musculoskeletal: Negative.   Neurological: Negative for dizziness, light-headedness and numbness.  Psychiatric/Behavioral: Negative.    Per HPI unless specifically indicated above     Objective:    BP 125/71 (BP Location: Right Arm, Patient Position: Sitting, Cuff Size: Normal)   Pulse 83   Temp 98 F (36.7 C) (Oral)   Resp 16   Ht _0  (1.575 m)   Wt 162 lb (73.5 kg)   BMI 29.63 kg/m   Wt Readings from Last 3 Encounters:  07/11/16 162 lb (73.5 kg)  06/20/16 163 lb (73.9 kg)  04/04/16 176 lb (79.8 kg)    Physical Exam    Constitutional: She is oriented to person, place, and time. She appears well-developed and well-nourished.  HENT:  Head: Normocephalic and atraumatic.  Neck: Neck supple.  Cardiovascular: Normal rate, regular rhythm and normal heart sounds.  Exam reveals no gallop and no friction rub.   No murmur heard. Pulmonary/Chest: Effort normal and breath sounds normal. She has no wheezes. She exhibits no tenderness.  Abdominal: Soft. Normal appearance and bowel sounds are normal. She exhibits no distension and no mass. There is no tenderness. There is no rebound and no guarding.  Musculoskeletal: Normal range of motion. She exhibits no edema or tenderness.  Lymphadenopathy:    She has no cervical adenopathy.  Neurological: She is alert and oriented to person, place, and time.  Skin: Skin is warm and dry. Rash noted. Rash is papular and vesicular.  Scattered crusted lesions on face and arms. Various stages of healing.    Results for orders placed or performed in visit on 06/20/16  Wound culture  Result Value Ref Range   Culture      Moderate STAPHYLOCOCCUS AUREUS No Herpes Simplex Virus detected.    Gram Stain No WBC Seen    Gram Stain No Squamous Epithelial Cells Seen    Gram Stain No Organisms Seen    Organism ID, Bacteria STAPHYLOCOCCUS AUREUS       Susceptibility   Staphylococcus aureus -  (no method available)    OXACILLIN 0.5 Sensitive     CEFAZOLIN  Sensitive     GENTAMICIN <=0.5 Sensitive     CIPROFLOXACIN <=0.5 Sensitive     LEVOFLOXACIN 0.25 Sensitive     MOXIFLOXACIN <=0.25 Sensitive     TRIMETH/SULFA <=10 Sensitive     VANCOMYCIN <=0.5 Sensitive     CLINDAMYCIN <=0.25 Sensitive     ERYTHROMYCIN <=0.25 Sensitive     LINEZOLID 2 Sensitive     QUINUPRISTIN/DALF <=0.25 Sensitive     RIFAMPIN <=0.5 Sensitive     TETRACYCLINE <=1 Sensitive   Herpes simplex virus culture  Result Value Ref Range   Organism ID, Bacteria No Herpes Simplex Virus detected.   POCT HgB A1C  Result  Value Ref Range   Hemoglobin A1C 5.5       Assessment & Plan:   Problem List Items Addressed This Visit      Other   Hypertriglyceridemia    Add fish oil to her current regimen to increase HDL. LDL goal is 100 and she is 101. This patient is statin intolerant. Recheck 3 mos and will plan to try zetia if not improved.       Dependence on nicotine from other tobacco product - Primary    Encouraged reduction of vaping for her health. Referred to [place] Quitline for resources. Paperwork for [facility] completed.        Other Visit Diagnoses  Pruritic erythematous rash       Pt will follow with dermatology on 9/8      No orders of the defined types were placed in this encounter.     Follow up plan: Return in about 11 weeks (around 09/26/2016) for HgA1c.Marland Kitchen

## 2016-07-11 NOTE — Patient Instructions (Signed)
Try reducing vaping to every other day and then weekly with [employer] to quit.  Go to Quit line [place]  Try adding fish oil OTC to help increase your HDL and reduce triglycerides.

## 2016-07-11 NOTE — Assessment & Plan Note (Signed)
Add fish oil to her current regimen to increase HDL. LDL goal is 100 and she is 101. This patient is statin intolerant. Recheck 3 mos and will plan to try zetia if not improved.

## 2016-07-11 NOTE — Assessment & Plan Note (Signed)
Encouraged reduction of vaping for her health. Referred to [place] Quitline for resources. Paperwork for [facility] completed.

## 2016-07-15 ENCOUNTER — Encounter: Payer: Self-pay | Admitting: Family Medicine

## 2016-09-03 ENCOUNTER — Telehealth: Payer: Self-pay | Admitting: Family Medicine

## 2016-09-03 NOTE — Telephone Encounter (Signed)
Pt said the dermatologist is not helping her sores.  Her asked to try bactroban cream instead.  Her call back number is [phone]

## 2016-09-04 ENCOUNTER — Other Ambulatory Visit: Payer: Self-pay | Admitting: Family Medicine

## 2016-09-04 MED ORDER — MUPIROCIN CALCIUM 2 % EX CREA: 1.0000 "application " | TOPICAL_CREAM | Freq: Two times a day (BID) | CUTANEOUS | 0 refills | Status: DC

## 2016-09-04 MED ORDER — MUPIROCIN CALCIUM 2 % NA OINT: TOPICAL_OINTMENT | NASAL | 0 refills | Status: DC

## 2016-09-04 NOTE — Telephone Encounter (Signed)
Called pt. LMTCB.  Have reviewed Dr. Elveria Rising note. She was treated for impetigo. The bactroban ointment may be helpful. But I would advised to follow-up with Dr. Phillip Heal for further symptoms.

## 2016-10-20 DIAGNOSIS — E782 Mixed hyperlipidemia: Secondary | ICD-10-CM | POA: Insufficient documentation

## 2016-10-20 DIAGNOSIS — Z9889 Other specified postprocedural states: Secondary | ICD-10-CM | POA: Insufficient documentation

## 2016-10-20 DIAGNOSIS — F411 Generalized anxiety disorder: Secondary | ICD-10-CM | POA: Insufficient documentation

## 2016-10-20 DIAGNOSIS — L298 Other pruritus: Secondary | ICD-10-CM | POA: Insufficient documentation

## 2016-11-26 ENCOUNTER — Ambulatory Visit: Payer: 59

## 2016-12-19 ENCOUNTER — Ambulatory Visit
Admission: RE | Admit: 2016-12-19 | Discharge: 2016-12-19 | Disposition: A | Discharge status: Home / Self Care | Payer: 59 | Source: Ambulatory Visit | Attending: Family Medicine | Admitting: Family Medicine

## 2016-12-19 ENCOUNTER — Other Ambulatory Visit: Payer: Self-pay | Admitting: Family Medicine

## 2016-12-19 DIAGNOSIS — Z1231 Encounter for screening mammogram for malignant neoplasm of breast: Secondary | ICD-10-CM

## 2016-12-19 DIAGNOSIS — Z1239 Encounter for other screening for malignant neoplasm of breast: Secondary | ICD-10-CM

## 2016-12-22 ENCOUNTER — Other Ambulatory Visit: Payer: Self-pay | Admitting: Family Medicine

## 2016-12-22 DIAGNOSIS — R928 Other abnormal and inconclusive findings on diagnostic imaging of breast: Secondary | ICD-10-CM

## 2016-12-22 DIAGNOSIS — N6489 Other specified disorders of breast: Secondary | ICD-10-CM

## 2016-12-24 ENCOUNTER — Telehealth: Payer: Self-pay | Admitting: *Deleted

## 2016-12-24 NOTE — Telephone Encounter (Signed)
Left message for patient to contact office if she has not been contacted by [facility]. She needs [place] left breast and orders have been placed.Courtney King

## 2016-12-25 ENCOUNTER — Other Ambulatory Visit: Payer: Self-pay | Admitting: Family Medicine

## 2016-12-25 DIAGNOSIS — R928 Other abnormal and inconclusive findings on diagnostic imaging of breast: Secondary | ICD-10-CM

## 2016-12-25 DIAGNOSIS — N6489 Other specified disorders of breast: Secondary | ICD-10-CM

## 2017-01-02 ENCOUNTER — Ambulatory Visit
Admission: RE | Admit: 2017-01-02 | Discharge: 2017-01-02 | Disposition: A | Discharge status: Home / Self Care | Payer: 59 | Source: Ambulatory Visit | Attending: Family Medicine | Admitting: Family Medicine

## 2017-01-02 DIAGNOSIS — R928 Other abnormal and inconclusive findings on diagnostic imaging of breast: Secondary | ICD-10-CM

## 2017-01-02 DIAGNOSIS — N6489 Other specified disorders of breast: Secondary | ICD-10-CM

## 2017-02-23 ENCOUNTER — Emergency Department: Payer: 59

## 2017-02-23 ENCOUNTER — Emergency Department
Admission: EM | Admit: 2017-02-23 | Discharge: 2017-02-23 | Disposition: A | Discharge status: Home / Self Care | Payer: 59 | Attending: Emergency Medicine | Admitting: Emergency Medicine

## 2017-02-23 ENCOUNTER — Encounter: Payer: Self-pay | Admitting: *Deleted

## 2017-02-23 DIAGNOSIS — F17200 Nicotine dependence, unspecified, uncomplicated: Secondary | ICD-10-CM | POA: Diagnosis not present

## 2017-02-23 DIAGNOSIS — I10 Essential (primary) hypertension: Secondary | ICD-10-CM | POA: Insufficient documentation

## 2017-02-23 DIAGNOSIS — Z7982 Long term (current) use of aspirin: Secondary | ICD-10-CM | POA: Diagnosis not present

## 2017-02-23 DIAGNOSIS — E119 Type 2 diabetes mellitus without complications: Secondary | ICD-10-CM | POA: Diagnosis not present

## 2017-02-23 DIAGNOSIS — Z79899 Other long term (current) drug therapy: Secondary | ICD-10-CM | POA: Diagnosis not present

## 2017-02-23 DIAGNOSIS — Z7984 Long term (current) use of oral hypoglycemic drugs: Secondary | ICD-10-CM | POA: Insufficient documentation

## 2017-02-23 DIAGNOSIS — R110 Nausea: Secondary | ICD-10-CM | POA: Diagnosis not present

## 2017-02-23 DIAGNOSIS — R1013 Epigastric pain: Secondary | ICD-10-CM | POA: Diagnosis not present

## 2017-02-23 HISTORY — DX: Type 2 diabetes mellitus without complications: E11.9

## 2017-02-23 LAB — COMPREHENSIVE METABOLIC PANEL
ALT: 16 U/L (ref 14–54)
AST: 18 U/L (ref 15–41)
Albumin: 4.1 g/dL (ref 3.5–5.0)
Alkaline Phosphatase: 57 U/L (ref 38–126)
Anion gap: 8 (ref 5–15)
BILIRUBIN TOTAL: 0.5 mg/dL (ref 0.3–1.2)
BUN: 17 mg/dL (ref 6–20)
CO2: 26 mmol/L (ref 22–32)
CREATININE: 0.57 mg/dL (ref 0.44–1.00)
Calcium: 9.4 mg/dL (ref 8.9–10.3)
Chloride: 107 mmol/L (ref 101–111)
Glucose, Bld: 87 mg/dL (ref 65–99)
POTASSIUM: 3.8 mmol/L (ref 3.5–5.1)
Sodium: 141 mmol/L (ref 135–145)
TOTAL PROTEIN: 7.1 g/dL (ref 6.5–8.1)

## 2017-02-23 LAB — URINALYSIS, COMPLETE (UACMP) WITH MICROSCOPIC
Bacteria, UA: NONE SEEN
Bilirubin Urine: NEGATIVE
GLUCOSE, UA: NEGATIVE mg/dL
HGB URINE DIPSTICK: NEGATIVE
Ketones, ur: NEGATIVE mg/dL
LEUKOCYTES UA: NEGATIVE
NITRITE: NEGATIVE
PH: 5 (ref 5.0–8.0)
Protein, ur: NEGATIVE mg/dL
SPECIFIC GRAVITY, URINE: 1.021 (ref 1.005–1.030)

## 2017-02-23 LAB — CBC
HCT: 42.6 % (ref 35.0–47.0)
Hemoglobin: 14.6 g/dL (ref 12.0–16.0)
MCH: 31.3 pg (ref 26.0–34.0)
MCHC: 34.2 g/dL (ref 32.0–36.0)
MCV: 91.3 fL (ref 80.0–100.0)
PLATELETS: 373 10*3/uL (ref 150–440)
RBC: 4.67 MIL/uL (ref 3.80–5.20)
RDW: 13 % (ref 11.5–14.5)
WBC: 8.5 10*3/uL (ref 3.6–11.0)

## 2017-02-23 LAB — LIPASE, BLOOD: LIPASE: 41 U/L (ref 11–51)

## 2017-02-23 LAB — TROPONIN I

## 2017-02-23 MED ORDER — ONDANSETRON HCL 4 MG/2ML IJ SOLN
4.0000 mg | Freq: Once | INTRAMUSCULAR | Status: AC | PRN
  Administered 2017-02-23: 4 mg via INTRAVENOUS
  Filled 2017-02-23: qty 2

## 2017-02-23 MED ORDER — IOPAMIDOL (ISOVUE-300) INJECTION 61%
30.0000 mL | Freq: Once | INTRAVENOUS | Status: AC
  Administered 2017-02-23: 30 mL via ORAL
  Filled 2017-02-23: qty 30

## 2017-02-23 MED ORDER — IOPAMIDOL (ISOVUE-300) INJECTION 61%
100.0000 mL | Freq: Once | INTRAVENOUS | Status: AC | PRN
  Administered 2017-02-23: 100 mL via INTRAVENOUS
  Filled 2017-02-23: qty 100

## 2017-02-23 MED ORDER — PROMETHAZINE HCL 12.5 MG PO TABS: 12.5000 mg | ORAL_TABLET | Freq: Four times a day (QID) | ORAL | 0 refills | Status: DC | PRN

## 2017-02-23 NOTE — Discharge Instructions (Signed)
Please call and schedule an appointment with GI. Return to the ER for symptoms that change or worsen if you are unable to schedule an appointment with PCP or the specialist.

## 2017-02-23 NOTE — ED Provider Notes (Signed)
Apolonio Schneiders, attending physician, personally viewed and interpreted this EKG  EKG Time: 1956 Rate: 70 Rhythm: normal sinus rhythm Axis: normal Intervals: qtc 414 QRS: narrow ST changes: no st elevation, t wave inversion V1-V3 Impression: abnormal ekg    Nance Pear, MD 02/23/17 2002

## 2017-02-23 NOTE — ED Triage Notes (Signed)
Pt to triage via wheelchair.  Pt reports nausea for 1 day.   Pt took zofran odt this am with some relief.  Pt denies any pain.  No cough.  No sore throat.  Pt alert.

## 2017-02-23 NOTE — ED Notes (Signed)
Pt resting on stretcher with visitor at the bedside. Contrast provided by CT. Pt encouraged to drink once feeling less nauseated. zofran given. Provided for comfort and will continue to assess.

## 2017-02-23 NOTE — ED Provider Notes (Signed)
[facility] Emergency Department Provider Note  ____________________________________________   First MD Initiated Contact with Patient 02/23/17 1952     (approximate)  I have reviewed the triage vital signs and the nursing notes.   HISTORY  Chief Complaint Nausea and Influenza   HPI Courtney King is a 51 y.o. female who presents to the emergency department for evaluation of epigastric pain and nausea. Symptoms started yesterday. She's not had any vomiting, but states that she just doesn't "feel right." She also complains of having intermittent dizziness and breaking out in a "cold sweat" occasionally over the past couple of days. 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 She states that she has annual screenings that are provided by the hospital each year for aneurysms. Otherwise, she has not had any follow-up since she was released by the vascular surgeon in 2013. She states that the pain that she is having now does not feel similar to pain she had before the vascular surgery. She denies a history of heart disease or MI. Epigastric pain that she is experiencing does not radiate into the back.    Past Medical History:  Diagnosis Date  . Allergy   . Anxiety   . Diabetes mellitus without complication ([place])   . GERD (gastroesophageal reflux disease)   . 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 Hyperlipidemia   . Hypertension   . Left leg claudication [facility])     Patient Active Problem List   Diagnosis Date Noted  . Dependence on nicotine from other tobacco product 07/11/2016  . Family history of colon cancer 12/07/2015  . Family history of ovarian cancer 12/07/2015  . Prediabetes 12/04/2015  . Hx of endarterectomy 11/02/2015  . Hypertension 11/02/2015  . Hypertriglyceridemia 09/03/2015  . Adiposity 09/03/2015   . BMI 29.0-29.9,adult 07/17/2015  . Anxiety 07/17/2015  . Acid reflux 07/17/2015  . Hot flash, menopausal 07/17/2015    Past Surgical History:  Procedure Laterality Date  . ABDOMINAL HYSTERECTOMY  2004  . AORTIC ENDARTERECETOMY  2013  . CHOLECYSTECTOMY  1999  . FOOT SURGERY Left    plantar fasciitis  . OOPHORECTOMY Bilateral 2004    Prior to Admission medications   Medication Sig Start Date End Date Taking? Authorizing Provider  aspirin EC 81 MG tablet Take 81 mg by mouth at bedtime.    Historical Provider, MD  Blood Glucose Monitoring Suppl (ONETOUCH VERIO) w/Device KIT 1 each by Does not apply route once. 03/12/16   Amy Overton Mam, NP  cephALEXin (KEFLEX) 500 MG capsule Take 1 capsule (500 mg total) by mouth 2 (two) times daily. 06/25/16   Amy Overton Mam, NP  clobetasol cream (TEMOVATE) 8.36 % Apply 1 application topically 2 (two) times daily. 06/20/16   Amy Overton Mam, NP  clonazePAM (KLONOPIN) 0.5 MG tablet Take 1 tablet (0.5 mg total) by mouth at bedtime. 06/20/16   Amy Overton Mam, NP  doxycycline (VIBRA-TABS) 100 MG tablet Take 1 tablet (100 mg total) by mouth 2 (two) times daily. 04/04/16   Amy Overton Mam, NP  fluticasone (FLONASE) 50 MCG/ACT nasal spray Place 2 sprays into the nose daily as needed for allergies.     Historical Provider, MD  gemfibrozil (LOPID) 600 MG tablet Take 1 tablet (600 mg total) by mouth 2 (two) times daily before a  meal. 03/18/16   Amy Overton Mam, NP  glucose blood (ONETOUCH VERIO) test strip Check blood sugar once daily. 03/12/16   Amy Overton Mam, NP  lisinopril (PRINIVIL,ZESTRIL) 20 MG tablet Take 0.5 tablets (10 mg total) by mouth at bedtime. 04/04/16   Amy Overton Mam, NP  meloxicam (MOBIC) 15 MG tablet Take 1 tablet (15 mg total) by mouth daily. Patient taking differently: Take 15 mg by mouth as needed for pain.  08/01/15   Max T Hyatt, DPM  metFORMIN (GLUCOPHAGE) 500 MG tablet Take 1 tablet (500 mg total) by mouth 2 (two) times daily with a  meal. 03/07/16   Amy Overton Mam, NP  mupirocin cream (BACTROBAN) 2 % Apply 1 application topically 2 (two) times daily. 09/04/16   Amy Overton Mam, NP  mupirocin nasal ointment (BACTROBAN) 2 % Use one-half of tube in each nostril twice daily for five (5) days. After application, press sides of nose together and gently massage. 09/04/16   Amy Overton Mam, NP  ONE TOUCH LANCETS MISC Check blood sugar daily. 03/12/16   Amy Overton Mam, NP  oxymetazoline (AFRIN NASAL SPRAY) 0.05 % nasal spray Place 1 spray into both nostrils 2 (two) times daily. 11/02/15   Amy Overton Mam, NP  pantoprazole (PROTONIX) 40 MG tablet Take 1 tablet (40 mg total) by mouth 2 (two) times daily. 02/22/16   Amy Overton Mam, NP  promethazine (PHENERGAN) 12.5 MG tablet Take 1 tablet (12.5 mg total) by mouth every 6 (six) hours as needed for nausea or vomiting. 02/23/17   Victorino Dike, FNP  ranitidine (ZANTAC) 150 MG tablet Take 1 tablet (150 mg total) by mouth 2 (two) times daily. 06/20/16   Amy Overton Mam, NP  venlafaxine XR (EFFEXOR-XR) 75 MG 24 hr capsule Take 1 capsule (75 mg total) by mouth daily with breakfast. 03/07/16   Amy Overton Mam, NP  Vitamin D, Ergocalciferol, (DRISDOL) 50000 units CAPS capsule Take 1 capsule by mouth  every 7 days 06/20/16   Amy Overton Mam, NP    Allergies Codeine  Family History  Problem Relation Age of Onset  . Cancer Mother     ovarian  . Cancer Maternal Aunt     ovarian    Social History Social History  Substance Use Topics  . Smoking status: Current Every Day Smoker    Packs/day: 1.00    Years: 20.00  . Smokeless tobacco: Current User  . Alcohol use No    Review of Systems Constitutional: No fever/chills Eyes: No visual changes. ENT: No sore throat. Cardiovascular: Denies chest pain. Respiratory: Denies shortness of breath. Gastrointestinal: Positive for epigastric pain with nausea. No vomiting. No diarrhea. Genitourinary: Negative for dysuria. Musculoskeletal:  Negative for back pain. Skin: Negative for rash. Neurological: Negative for headaches, focal weakness or numbness. ____________________________________________   PHYSICAL EXAM:  VITAL SIGNS: ED Triage Vitals  Enc Vitals Group     BP 02/23/17 1930 (!) 153/69     Pulse Rate 02/23/17 1930 89     Resp 02/23/17 1930 20     Temp 02/23/17 1930 97.8 F (36.6 C)     Temp Source 02/23/17 1930 Oral     SpO2 02/23/17 1930 96 %     Weight 02/23/17 1931 156 lb (70.8 kg)     Height 02/23/17 1931 5' 2"  (1.575 m)     Head Circumference --      Peak Flow --      Pain Score 02/23/17 1950 2  Pain Loc --      Pain Edu? --      Excl. in [place]? --     Constitutional: Alert and oriented. Uncomfortable appearing and in no acute distress. Eyes: Conjunctivae are normal. PERRL. EOMI. Head: Atraumatic. Nose: No congestion/rhinnorhea. Mouth/Throat: Mucous membranes are moist.  Neck: No stridor.   Cardiovascular: Normal rate, regular rhythm. Grossly normal heart sounds.  Good peripheral circulation. Respiratory: Normal respiratory effort.  No retractions. Lungs CTAB. Gastrointestinal: Soft and nontender. No distention. No abdominal bruits. No CVA tenderness. Musculoskeletal: No lower extremity tenderness nor edema.  No joint effusions. Neurologic:  Normal speech and language. No gross focal neurologic deficits are appreciated. No gait instability. Skin:  Skin is warm, dry and intact. No rash noted. Psychiatric: Mood and affect are normal. Speech and behavior are normal.  ____________________________________________   LABS (all labs ordered are listed, but only abnormal results are displayed)  Labs Reviewed  URINALYSIS, COMPLETE (UACMP) WITH MICROSCOPIC - Abnormal; Notable for the following:       Result Value   Color, Urine YELLOW (*)    APPearance HAZY (*)    Squamous Epithelial / LPF 0-5 (*)    All other components within normal limits  LIPASE, BLOOD  COMPREHENSIVE METABOLIC PANEL  CBC    TROPONIN I   ____________________________________________  EKG  Normal sinus rhythm with ventricular rate of 70, PR interval of 0.18, QRS 0.80 with a QT segment of 0.384. No axis deviation. No ectopy.  ____________________________________________  RADIOLOGY  Chest x-ray is negative for acute abnormality per radiology.  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 ____________________________________________   PROCEDURES  Procedure(s) performed: None  Procedures  Critical Care performed: No  ____________________________________________   INITIAL IMPRESSION / ASSESSMENT AND PLAN / ED COURSE  Pertinent labs & imaging results that were available during my care of the patient were reviewed by me and considered in my medical decision making (see chart for details).  51 year old female presenting to the emergency department for evaluation of epigastric pain and nausea. Symptoms present for the past 2 days. Gallbladder is surgically absent. We will evaluate with labs, EKG and follow-up with a CT abdomen and pelvis if all are otherwise negative.  ----------------------------------------- 9:21 PM on 02/23/2017 -----------------------------------------  Labs and x-rays reviewed. CT abdomen and pelvis with contrast ordered. Patient states that the epigastric area is "just uncomfortable right now." She has had no active vomiting reported while in the emergency department.   23:26 PM patient discharged home and encouraged to follow up with gastroenterology. She'll be given a prescription for Phenergan since the Zofran didn't seem to work when she took it this morning. She was instructed to return to the emergency department for symptoms that change or worsen if she's unable schedule an appointment with a specialist or see her primary care  provider. ____________________________________________   FINAL CLINICAL IMPRESSION(S) / ED DIAGNOSES  Final diagnoses:  Epigastric pain  Nausea      NEW MEDICATIONS STARTED DURING THIS VISIT:  Discharge Medication List as of 02/23/2017 11:30 PM    START taking these medications   Details  promethazine (PHENERGAN) 12.5 MG tablet Take 1 tablet (12.5 mg total) by mouth every 6 (six) hours as needed for nausea or vomiting., Starting Mon 02/23/2017, Print         Note:  This document was prepared using Dragon voice recognition software and may  include unintentional dictation errors.    Victorino Dike, FNP 02/24/17 [id number]    Harvest Dark, MD 02/27/17 [phone]

## 2017-03-10 ENCOUNTER — Ambulatory Visit (INDEPENDENT_AMBULATORY_CARE_PROVIDER_SITE_OTHER): Payer: 59 | Admitting: Gastroenterology

## 2017-03-10 ENCOUNTER — Telehealth: Payer: Self-pay

## 2017-03-10 ENCOUNTER — Encounter: Payer: Self-pay | Admitting: Gastroenterology

## 2017-03-10 ENCOUNTER — Other Ambulatory Visit: Payer: Self-pay

## 2017-03-10 VITALS — BP 103/55 | HR 74 | Temp 97.6°F | Ht 62.0 in | Wt 160.8 lb

## 2017-03-10 DIAGNOSIS — Z800 Family history of malignant neoplasm of digestive organs: Secondary | ICD-10-CM | POA: Diagnosis not present

## 2017-03-10 DIAGNOSIS — K219 Gastro-esophageal reflux disease without esophagitis: Secondary | ICD-10-CM | POA: Diagnosis not present

## 2017-03-10 NOTE — Telephone Encounter (Signed)
Gastroenterology Pre-Procedure Review  Request Date: 5/18 Requesting Physician: Dr. Vicente Males  PATIENT REVIEW QUESTIONS: The patient responded to the following health history questions as indicated:    1. Are you having any GI issues? yes (GERD) 2. Do you have a personal history of Polyps? no 3. Do you have a family history of Colon Cancer or Polyps? no 4. Diabetes Mellitus? yes (Type II) 5. Joint replacements in the past 12 months?no 6. Major health problems in the past 3 months?no 7. Any artificial heart valves, MVP, or defibrillator?no    MEDICATIONS & ALLERGIES:    Patient reports the following regarding taking any anticoagulation/antiplatelet therapy:   Plavix, Coumadin, Eliquis, Xarelto, Lovenox, Pradaxa, Brilinta, or Effient? no Aspirin? yes (80m)  Patient confirms/reports the following medications:  Current Outpatient Prescriptions  Medication Sig Dispense Refill  . aspirin EC 81 MG tablet Take 81 mg by mouth at bedtime.    . Blood Glucose Monitoring Suppl (ONETOUCH VERIO) w/Device KIT 1 each by Does not apply route once. 1 kit 0  . docusate sodium (COLACE) 100 MG capsule Take 100 mg by mouth.    . doxycycline (VIBRA-TABS) 100 MG tablet Take 1 tablet (100 mg total) by mouth 2 (two) times daily. (Patient not taking: Reported on 03/10/2017) 20 tablet 0  . fluticasone (FLONASE) 50 MCG/ACT nasal spray Place 2 sprays into the nose daily as needed for allergies.     .Marland Kitchengemfibrozil (LOPID) 600 MG tablet Take 1 tablet (600 mg total) by mouth 2 (two) times daily before a meal. (Patient not taking: Reported on 03/10/2017) 180 tablet 3  . glucose blood (ONETOUCH VERIO) test strip Check blood sugar once daily. 100 each 12  . Halcinonide 0.1 % CREA Apply topically.    . hydrOXYzine (ATARAX/VISTARIL) 25 MG tablet Take by mouth.    . hydrOXYzine (ATARAX/VISTARIL) 50 MG tablet Take 50 mg by mouth.    .Elmore GuiseDevices ([employer] LANCING DEVICE) MISC Check blood sugar daily.    .Marland Kitchenlisinopril  (PRINIVIL,ZESTRIL) 20 MG tablet Take 0.5 tablets (10 mg total) by mouth at bedtime. 90 tablet 3  . meloxicam (MOBIC) 15 MG tablet Take 1 tablet (15 mg total) by mouth daily. (Patient not taking: Reported on 03/10/2017) 30 tablet 3  . metFORMIN (GLUCOPHAGE) 500 MG tablet Take 1 tablet (500 mg total) by mouth 2 (two) times daily with a meal. 180 tablet 3  . Multiple Vitamins-Minerals (COMPLETE) TABS Take by mouth.    . mupirocin cream (BACTROBAN) 2 % Apply 1 application topically 2 (two) times daily. (Patient not taking: Reported on 03/10/2017) 15 g 0  . mupirocin nasal ointment (BACTROBAN) 2 % Use one-half of tube in each nostril twice daily for five (5) days. After application, press sides of nose together and gently massage. (Patient not taking: Reported on 03/10/2017) 10 g 0  . Omega-3 Fatty Acids (FISH OIL PO) Take by mouth.    . ONE TOUCH LANCETS MISC Check blood sugar daily. 200 each 12  . oxymetazoline (AFRIN NASAL SPRAY) 0.05 % nasal spray Place 1 spray into both nostrils 2 (two) times daily. (Patient not taking: Reported on 03/10/2017) 30 mL 0  . polyethylene glycol powder (GLYCOLAX/MIRALAX) powder Take by mouth.    . promethazine (PHENERGAN) 12.5 MG tablet Take 1 tablet (12.5 mg total) by mouth every 6 (six) hours as needed for nausea or vomiting. 30 tablet 0  . RABEprazole (ACIPHEX) 20 MG tablet Take by mouth.    . ranitidine (ZANTAC) 150 MG tablet Take  1 tablet (150 mg total) by mouth 2 (two) times daily.    . triamcinolone cream (KENALOG) 0.1 % Apply topically.    . venlafaxine XR (EFFEXOR-XR) 75 MG 24 hr capsule Take 1 capsule (75 mg total) by mouth daily with breakfast. 90 capsule 3  . Vitamin D, Ergocalciferol, (DRISDOL) 50000 units CAPS capsule Take 1 capsule by mouth  every 7 days (Patient not taking: Reported on 03/10/2017) 12 capsule 2   No current facility-administered medications for this visit.     Patient confirms/reports the following allergies:  Allergies  Allergen Reactions   . Codeine Other (See Comments), Nausea And Vomiting and Nausea Only    Other Reaction: Not Assessed Other Reaction: Not Assessed Other Reaction: Not Assessed Other Reaction: Not Assessed    No orders of the defined types were placed in this encounter.   AUTHORIZATION INFORMATION Primary Insurance: 1D#: Group #:  Secondary Insurance: 1D#: Group #:  SCHEDULE INFORMATION: Date: 5/18 Time: Location: [facility]  

## 2017-03-10 NOTE — Progress Notes (Signed)
Gastroenterology Consultation  Referring Provider:     Luciana Axe, NP Primary Care Physician:  Leata Mouse, NP Primary Gastroenterologist:  Dr. Jonathon Bellows  Reason for Consultation:     GERD        HPI:   Courtney King is a 51 y.o. y/o female referred for consultation & management  by Dr. Leata Mouse, NP.  She was recently seen in the ER on 02/23/17 for abdominal pain ,nausea of one day duration CT-scan of the abdomen showed mild diverticulosis of the sigmoid colon , duodenal diverticulum. She continued to have nausea and emesis and hence was referred to see me.  Labs 02/2017- CBC,CMP-normal   She says she is here to see me for nausea and vomiting . She says that she has had GERD, had an EGD in 2013 . She was told " the acid had moved to her stomach " was placed on Protonix since 2013 BID, now she says that it is not working . When she went to see her doctor recently was placed on zantac in addiiton at night which has helped but not completely.   She says she wakes up in the morning nauseated, at times she feels like she is choking in the morning , 2 zantacs taken together help her.   No recent weight gain rather lost intentionally. She has her dinner at around 4 pm , goes to bed at around 10 pm , in between she is sitting up , She takes her PPI before meals. The dose at night is after meals. She does not use a wedge pillow.    Mother had colon cancer , aunt also had colon cancer, she has never had a prior colonosocpy . No lower GI symptoms.   She belches a lot and consumes sweetnlow along with coffee mate in her coffees.   Past Medical History:  Diagnosis Date  . Allergy   . Anxiety   . Diabetes mellitus without complication ([place])   . GERD (gastroesophageal reflux disease)   . 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 Hyperlipidemia   . Hypertension   . Left leg claudication [facility])     Past Surgical History:  Procedure Laterality Date  .  ABDOMINAL HYSTERECTOMY  2004  . AORTIC ENDARTERECETOMY  2013  . CHOLECYSTECTOMY  1999  . FOOT SURGERY Left    plantar fasciitis  . OOPHORECTOMY Bilateral 2004    Prior to Admission medications   Medication Sig Start Date End Date Taking? Authorizing Provider  albuterol (PROVENTIL HFA;VENTOLIN HFA) 108 (90 Base) MCG/ACT inhaler Frequency:PHARMDIR   Dosage:90   MCG  Instructions:  Note:Inhale two puffs as directed, every 4 to 6 hours, as needed Dose: 90 MCG 11/20/12   Historical Provider, MD  aspirin EC 81 MG tablet Take 81 mg by mouth at bedtime.    Historical Provider, MD  Blood Glucose Monitoring Suppl (ONETOUCH VERIO) w/Device KIT 1 each by Does not apply route once. 03/12/16   Amy Overton Mam, NP  cephALEXin (KEFLEX) 500 MG capsule Take 1 capsule (500 mg total) by mouth 2 (two) times daily. 06/25/16   Amy Overton Mam, NP  clobetasol cream (TEMOVATE) 6.76 % Apply 1 application topically 2 (two) times daily. 06/20/16   Amy Overton Mam, NP  clonazePAM (KLONOPIN) 0.5 MG tablet Take 1 tablet (0.5 mg total) by mouth at bedtime. 06/20/16   Amy Overton Mam, NP  docusate sodium (COLACE) 100 MG  capsule Take 100 mg by mouth. 08/30/12   Historical Provider, MD  doxycycline (VIBRA-TABS) 100 MG tablet Take 1 tablet (100 mg total) by mouth 2 (two) times daily. 04/04/16   Amy Overton Mam, NP  famotidine (PEPCID) 20 MG tablet Take 20 mg by mouth. 08/30/12   Historical Provider, MD  fluticasone (FLONASE) 50 MCG/ACT nasal spray Place 2 sprays into the nose daily as needed for allergies.     Historical Provider, MD  gemfibrozil (LOPID) 600 MG tablet Take 1 tablet (600 mg total) by mouth 2 (two) times daily before a meal. 03/18/16   Amy Lauren Krebs, NP  glucose blood (ONETOUCH VERIO) test strip Check blood sugar once daily. 03/12/16   Amy Lauren Krebs, NP  Halcinonide 0.1 % CREA Apply topically.    Historical Provider, MD  hydrOXYzine (ATARAX/VISTARIL) 25 MG tablet Take by mouth. 11/21/16   Historical Provider, MD    hydrOXYzine (ATARAX/VISTARIL) 50 MG tablet Take 50 mg by mouth.    Historical Provider, MD  Lancet Devices ([employer] LANCING DEVICE) MISC Check blood sugar daily. 03/12/16   Historical Provider, MD  lisinopril (PRINIVIL,ZESTRIL) 20 MG tablet Take 0.5 tablets (10 mg total) by mouth at bedtime. 04/04/16   Amy Overton Mam, NP  meloxicam (MOBIC) 15 MG tablet Take 1 tablet (15 mg total) by mouth daily. Patient taking differently: Take 15 mg by mouth as needed for pain.  08/01/15   Max T Hyatt, DPM  metFORMIN (GLUCOPHAGE) 500 MG tablet Take 1 tablet (500 mg total) by mouth 2 (two) times daily with a meal. 03/07/16   Amy Overton Mam, NP  Multiple Vitamins-Minerals (COMPLETE) TABS Take by mouth. 07/22/12   Historical Provider, MD  mupirocin cream (BACTROBAN) 2 % Apply 1 application topically 2 (two) times daily. 09/04/16   Amy Overton Mam, NP  mupirocin nasal ointment (BACTROBAN) 2 % Use one-half of tube in each nostril twice daily for five (5) days. After application, press sides of nose together and gently massage. 09/04/16   Amy Overton Mam, NP  Omega-3 Fatty Acids (FISH OIL PO) Take by mouth. 07/22/12   Historical Provider, MD  ONE TOUCH LANCETS MISC Check blood sugar daily. 03/12/16   Amy Overton Mam, NP  oxymetazoline (AFRIN NASAL SPRAY) 0.05 % nasal spray Place 1 spray into both nostrils 2 (two) times daily. 11/02/15   Amy Overton Mam, NP  pantoprazole (PROTONIX) 40 MG tablet Take 1 tablet (40 mg total) by mouth 2 (two) times daily. 02/22/16   Amy Overton Mam, NP  polyethylene glycol powder (GLYCOLAX/MIRALAX) powder Take by mouth. 08/30/12   Historical Provider, MD  promethazine (PHENERGAN) 12.5 MG tablet Take 1 tablet (12.5 mg total) by mouth every 6 (six) hours as needed for nausea or vomiting. 02/23/17   Victorino Dike, FNP  RABEprazole (ACIPHEX) 20 MG tablet Take by mouth.    Historical Provider, MD  ranitidine (ZANTAC) 150 MG tablet Take 1 tablet (150 mg total) by mouth 2 (two) times daily. 06/20/16   Amy  Overton Mam, NP  triamcinolone cream (KENALOG) 0.1 % Apply topically.    Historical Provider, MD  venlafaxine XR (EFFEXOR-XR) 75 MG 24 hr capsule Take 1 capsule (75 mg total) by mouth daily with breakfast. 03/07/16   Amy Overton Mam, NP  Vitamin D, Ergocalciferol, (DRISDOL) 50000 units CAPS capsule Take 1 capsule by mouth  every 7 days 06/20/16   Amy Overton Mam, NP    Family History  Problem Relation Age of Onset  . Cancer Mother  ovarian  . Cancer Maternal Aunt     ovarian     Social History  Substance Use Topics  . Smoking status: Current Every Day Smoker    Packs/day: 1.00    Years: 20.00  . Smokeless tobacco: Current User  . Alcohol use No    Allergies as of 03/10/2017 - Review Complete 02/23/2017  Allergen Reaction Noted  . Codeine Other (See Comments), Nausea And Vomiting, and Nausea Only 08/15/2014    Review of Systems:    All systems reviewed and negative except where noted in HPI.   Physical Exam:  There were no vitals taken for this visit. No LMP recorded. Patient has had a hysterectomy. Psych:  Alert and cooperative. Normal mood and affect. General:   Alert,  Well-developed, well-nourished, pleasant and cooperative in NAD Head:  Normocephalic and atraumatic. Eyes:  Sclera clear, no icterus.   Conjunctiva pink. Ears:  Normal auditory acuity. Nose:  No deformity, discharge, or lesions. Mouth:  No deformity or lesions,oropharynx pink & moist. Neck:  Supple; no masses or thyromegaly. Lungs:  Respirations even and unlabored.  Clear throughout to auscultation.   No wheezes, crackles, or rhonchi. No acute distress. Heart:  Regular rate and rhythm; no murmurs, clicks, rubs, or gallops. Abdomen:  Normal bowel sounds.  No bruits.  Soft, non-tender and non-distended without masses, hepatosplenomegaly or hernias noted.  No guarding or rebound tenderness.    Msk:  Symmetrical without gross deformities. Good, equal movement & strength bilaterally. Pulses:  Normal pulses  noted. Extremities:  No clubbing or edema.  No cyanosis. Neurologic:  Alert and oriented x3;  grossly normal neurologically. Psych:  Alert and cooperative. Normal mood and affect.  Imaging Studies: Dg Chest 2 View  Result Date: 02/23/2017 CLINICAL DATA:  Nausea for 1 day. EXAM: CHEST  2 VIEW COMPARISON:  None. FINDINGS: The heart size and mediastinal contours are within normal limits. Both lungs are clear. The visualized skeletal structures are unremarkable. IMPRESSION: No active cardiopulmonary disease. Electronically Signed   By: Misty Stanley M.D.   On: 02/23/2017 20:48   Ct Abdomen Pelvis W Contrast  Result Date: 02/23/2017 CLINICAL DATA:  Acute onset of nausea, vomiting and epigastric abdominal pain. Initial encounter. EXAM: CT ABDOMEN AND PELVIS WITH CONTRAST TECHNIQUE: Multidetector CT imaging of the abdomen and pelvis was performed using the standard protocol following bolus administration of intravenous contrast. CONTRAST:  191m ISOVUE-300 IOPAMIDOL (ISOVUE-300) INJECTION 61% COMPARISON:  Abdominal ultrasound performed 11/23/2013 FINDINGS: Lower chest: The visualized lung bases are grossly clear. The visualized portions of the mediastinum are unremarkable. Hepatobiliary: The liver is unremarkable in appearance. The patient is status post cholecystectomy, with clips noted at the gallbladder fossa. The common bile duct remains normal in caliber. Pancreas: The pancreas is within normal limits. 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 Spleen: The spleen is unremarkable in appearance. Adrenals/Urinary Tract: The adrenal glands are unremarkable in appearance. The kidneys are within normal limits. There is no evidence of hydronephrosis. No renal or ureteral stones are identified. No perinephric stranding is seen. Stomach/Bowel: The stomach is unremarkable in appearance. The small bowel is within normal limits. The appendix is normal in caliber, without evidence of appendicitis. Mild  diverticulosis is noted along the sigmoid colon, without evidence of diverticulitis. Vascular/Lymphatic: Scattered calcification is seen along the abdominal aorta and its branches. The abdominal aorta is otherwise grossly unremarkable. The inferior vena cava is grossly unremarkable. No retroperitoneal lymphadenopathy is seen. No pelvic sidewall lymphadenopathy is identified. Reproductive: The  bladder is mildly distended and grossly unremarkable. The patient is status post hysterectomy. No suspicious adnexal masses are seen. Other: No additional soft tissue abnormalities are seen. Musculoskeletal: No acute osseous abnormalities are identified. Mild facet disease is noted at the lower lumbar spine. The visualized musculature is unremarkable in appearance. IMPRESSION: 1. No acute abnormality seen within the abdomen or pelvis. 2. Mild diverticulosis along the sigmoid colon, without evidence of diverticulitis. 3. Scattered aortic atherosclerosis. 4. 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 Electronically Signed   By: Garald Balding M.D.   On: 02/23/2017 22:59    Assessment and Plan:   TAMALA MANZER is a 51 y.o. y/o female has been referred for GERD. Her history is very suggestive of GERD, not clear why she is not responding to Protonix but is responding to high dose zantac. She may have PPI failure and I will try and see if she will do better on [place] provided. Counseled on life style changes, suggest to use PPI first thing in the morning on empty stomach and eat 30 minutes after. Advised on the use of a wedge pillow at night , avoid meals for 2 hours prior to bed time. Weight loss   I will also proceed with an EGD to r/o a large hiatal hernia as the cause of worsening reflux, She is also due for a screening colonoscopy with family history of colon cancer in her mother    Follow up in 31 weeks.   Dr Jonathon Bellows MD

## 2017-03-11 ENCOUNTER — Telehealth: Payer: Self-pay | Admitting: Gastroenterology

## 2017-03-11 NOTE — Telephone Encounter (Signed)
03/11/17 Per Maudie Mercury at [facility] is required for EGD 43235 and Colonoscopy 45378 K21.9 & Z80.0. Auth # [id number].

## 2017-03-19 ENCOUNTER — Telehealth: Payer: Self-pay | Admitting: Gastroenterology

## 2017-03-19 ENCOUNTER — Other Ambulatory Visit: Payer: Self-pay | Admitting: Gastroenterology

## 2017-03-19 ENCOUNTER — Ambulatory Visit: Payer: 59 | Admitting: Gastroenterology

## 2017-03-19 DIAGNOSIS — K219 Gastro-esophageal reflux disease without esophagitis: Secondary | ICD-10-CM

## 2017-03-19 MED ORDER — DEXLANSOPRAZOLE 60 MG PO CPDR: 60.0000 mg | DELAYED_RELEASE_CAPSULE | Freq: Every day | ORAL | 3 refills | Status: DC

## 2017-03-19 NOTE — Telephone Encounter (Signed)
Patient left a voice message that she was in last week and was given Dexilant samples and they are working.  She needs a RX called into [facility] today please.  Wk  [phone] Cell [phone]

## 2017-03-19 NOTE — Telephone Encounter (Signed)
Done

## 2017-03-19 NOTE — Progress Notes (Signed)
Refill provided

## 2017-04-03 ENCOUNTER — Encounter: Payer: Self-pay | Admitting: *Deleted

## 2017-04-03 ENCOUNTER — Encounter
Admission: RE | Disposition: A | Discharge status: Home / Self Care | Payer: Self-pay | Source: Ambulatory Visit | Attending: Gastroenterology

## 2017-04-03 ENCOUNTER — Ambulatory Visit: Payer: 59 | Admitting: Registered Nurse

## 2017-04-03 ENCOUNTER — Ambulatory Visit
Admission: RE | Admit: 2017-04-03 | Discharge: 2017-04-03 | Disposition: A | Discharge status: Home / Self Care | Payer: 59 | Source: Ambulatory Visit | Attending: Gastroenterology | Admitting: Gastroenterology

## 2017-04-03 DIAGNOSIS — K228 Other specified diseases of esophagus: Secondary | ICD-10-CM | POA: Diagnosis not present

## 2017-04-03 DIAGNOSIS — K6389 Other specified diseases of intestine: Secondary | ICD-10-CM | POA: Insufficient documentation

## 2017-04-03 DIAGNOSIS — I739 Peripheral vascular disease, unspecified: Secondary | ICD-10-CM | POA: Insufficient documentation

## 2017-04-03 DIAGNOSIS — I10 Essential (primary) hypertension: Secondary | ICD-10-CM | POA: Diagnosis not present

## 2017-04-03 DIAGNOSIS — D122 Benign neoplasm of ascending colon: Secondary | ICD-10-CM | POA: Diagnosis not present

## 2017-04-03 DIAGNOSIS — F17210 Nicotine dependence, cigarettes, uncomplicated: Secondary | ICD-10-CM | POA: Diagnosis not present

## 2017-04-03 DIAGNOSIS — Z7984 Long term (current) use of oral hypoglycemic drugs: Secondary | ICD-10-CM | POA: Diagnosis not present

## 2017-04-03 DIAGNOSIS — K640 First degree hemorrhoids: Secondary | ICD-10-CM | POA: Insufficient documentation

## 2017-04-03 DIAGNOSIS — K449 Diaphragmatic hernia without obstruction or gangrene: Secondary | ICD-10-CM | POA: Diagnosis not present

## 2017-04-03 DIAGNOSIS — Z7982 Long term (current) use of aspirin: Secondary | ICD-10-CM | POA: Diagnosis not present

## 2017-04-03 DIAGNOSIS — K2970 Gastritis, unspecified, without bleeding: Secondary | ICD-10-CM | POA: Diagnosis not present

## 2017-04-03 DIAGNOSIS — F419 Anxiety disorder, unspecified: Secondary | ICD-10-CM | POA: Diagnosis not present

## 2017-04-03 DIAGNOSIS — K219 Gastro-esophageal reflux disease without esophagitis: Secondary | ICD-10-CM

## 2017-04-03 DIAGNOSIS — K2950 Unspecified chronic gastritis without bleeding: Secondary | ICD-10-CM | POA: Diagnosis not present

## 2017-04-03 DIAGNOSIS — K5730 Diverticulosis of large intestine without perforation or abscess without bleeding: Secondary | ICD-10-CM

## 2017-04-03 DIAGNOSIS — E119 Type 2 diabetes mellitus without complications: Secondary | ICD-10-CM | POA: Diagnosis not present

## 2017-04-03 DIAGNOSIS — Z1211 Encounter for screening for malignant neoplasm of colon: Secondary | ICD-10-CM | POA: Insufficient documentation

## 2017-04-03 DIAGNOSIS — Z800 Family history of malignant neoplasm of digestive organs: Secondary | ICD-10-CM

## 2017-04-03 DIAGNOSIS — Z79899 Other long term (current) drug therapy: Secondary | ICD-10-CM | POA: Diagnosis not present

## 2017-04-03 DIAGNOSIS — K229 Disease of esophagus, unspecified: Secondary | ICD-10-CM | POA: Diagnosis not present

## 2017-04-03 DIAGNOSIS — E785 Hyperlipidemia, unspecified: Secondary | ICD-10-CM | POA: Insufficient documentation

## 2017-04-03 HISTORY — PX: ESOPHAGOGASTRODUODENOSCOPY (EGD) WITH PROPOFOL: SHX5813

## 2017-04-03 HISTORY — PX: COLONOSCOPY WITH PROPOFOL: SHX5780

## 2017-04-03 SURGERY — COLONOSCOPY WITH PROPOFOL
Anesthesia: General

## 2017-04-03 MED ORDER — PROPOFOL 10 MG/ML IV BOLUS
INTRAVENOUS | Status: DC | PRN
  Administered 2017-04-03: 80 mg via INTRAVENOUS

## 2017-04-03 MED ORDER — MIDAZOLAM HCL 2 MG/2ML IJ SOLN
INTRAMUSCULAR | Status: AC
  Filled 2017-04-03: qty 2

## 2017-04-03 MED ORDER — GLYCOPYRROLATE 0.2 MG/ML IJ SOLN
INTRAMUSCULAR | Status: DC | PRN
  Administered 2017-04-03: 0.2 mg via INTRAVENOUS

## 2017-04-03 MED ORDER — PROPOFOL 500 MG/50ML IV EMUL
INTRAVENOUS | Status: DC | PRN
  Administered 2017-04-03: 150 ug/kg/min via INTRAVENOUS

## 2017-04-03 MED ORDER — MIDAZOLAM HCL 2 MG/2ML IJ SOLN
INTRAMUSCULAR | Status: DC | PRN
  Administered 2017-04-03: 2 mg via INTRAVENOUS

## 2017-04-03 MED ORDER — PHENYLEPHRINE HCL 10 MG/ML IJ SOLN
INTRAMUSCULAR | Status: DC | PRN
  Administered 2017-04-03: 100 ug via INTRAVENOUS

## 2017-04-03 MED ORDER — GLYCOPYRROLATE 0.2 MG/ML IJ SOLN
INTRAMUSCULAR | Status: AC
  Filled 2017-04-03: qty 1

## 2017-04-03 MED ORDER — SODIUM CHLORIDE 0.9 % IV SOLN
INTRAVENOUS | Status: DC
  Administered 2017-04-03: 1000 mL via INTRAVENOUS

## 2017-04-03 MED ORDER — LIDOCAINE HCL (CARDIAC) 20 MG/ML IV SOLN
INTRAVENOUS | Status: DC | PRN
  Administered 2017-04-03: 40 mg via INTRAVENOUS

## 2017-04-03 MED ORDER — PROPOFOL 500 MG/50ML IV EMUL
INTRAVENOUS | Status: AC
  Filled 2017-04-03: qty 50

## 2017-04-03 NOTE — Anesthesia Preprocedure Evaluation (Signed)
Anesthesia Evaluation  Patient identified by MRN, date of birth, ID band Patient awake    Reviewed: Allergy & Precautions, NPO status , Patient's Chart, lab work & pertinent test results  Airway Mallampati: III  TM Distance: <3 FB     Dental  (+) Chipped   Pulmonary Current Smoker,    Pulmonary exam normal        Cardiovascular hypertension, Pt. on medications + Peripheral Vascular Disease  Normal cardiovascular exam     Neuro/Psych PSYCHIATRIC DISORDERS Anxiety negative neurological ROS     GI/Hepatic Neg liver ROS, GERD  Medicated,  Endo/Other  diabetes  Renal/GU negative Renal ROS  negative genitourinary   Musculoskeletal negative musculoskeletal ROS (+)   Abdominal Normal abdominal exam  (+)   Peds negative pediatric ROS (+)  Hematology negative hematology ROS (+)   Anesthesia Other Findings   Reproductive/Obstetrics                             Anesthesia Physical Anesthesia Plan  ASA: III  Anesthesia Plan: General   Post-op Pain Management:    Induction: Intravenous  Airway Management Planned: Nasal Cannula  Additional Equipment:   Intra-op Plan:   Post-operative Plan:   Informed Consent: I have reviewed the patients History and Physical, chart, labs and discussed the procedure including the risks, benefits and alternatives for the proposed anesthesia with the patient or authorized representative who has indicated his/her understanding and acceptance.   Dental advisory given  Plan Discussed with: CRNA and Surgeon  Anesthesia Plan Comments:         Anesthesia Quick Evaluation

## 2017-04-03 NOTE — Anesthesia Procedure Notes (Signed)
Date/Time: 04/03/2017 11:50 AM Performed by: Doreen Salvage Pre-anesthesia Checklist: Patient identified, Emergency Drugs available, Suction available and Patient being monitored Patient Re-evaluated:Patient Re-evaluated prior to inductionOxygen Delivery Method: Nasal cannula Intubation Type: IV induction Dental Injury: Teeth and Oropharynx as per pre-operative assessment  Comments: Nasal cannula with etCO2 monitoring

## 2017-04-03 NOTE — Op Note (Signed)
[facility] Gastroenterology Patient Name: Courtney King Procedure Date: 04/03/2017 11:48 AM MRN: [id number] Account #: [id number] Date of Birth: 09-24-1966 Admit Type: Outpatient Age: 51 Room: [facility] ENDO ROOM 4 Gender: Female Note Status: Finalized Procedure:            Colonoscopy Indications:          Screening in patient at increased risk: Family history                        of 1st-degree relative with colorectal cancer Providers:            Jonathon Bellows MD, MD Referring MD:         Leata Mouse (Referring MD) Medicines:            Monitored Anesthesia Care Complications:        No immediate complications. Procedure:            Pre-Anesthesia Assessment:                       - Prior to the procedure, a History and Physical was                        performed, and patient medications, allergies and                        sensitivities were reviewed. The patient's tolerance of                        previous anesthesia was reviewed.                       - The risks and benefits of the procedure and the                        sedation options and risks were discussed with the                        patient. All questions were answered and informed                        consent was obtained.                       - ASA Grade Assessment: II - A patient with mild                        systemic disease.                       After obtaining informed consent, the colonoscope was                        passed under direct vision. Throughout the procedure,                        the patient's blood pressure, pulse, and oxygen                        saturations were monitored continuously. The  Colonoscope was introduced through the anus and                        advanced to the the cecum, identified by the                        appendiceal orifice, IC valve and transillumination.                        The colonoscopy was performed with  ease. The patient                        tolerated the procedure well. The quality of the bowel                        preparation was good. Findings:      The perianal and digital rectal examinations were normal.      A 5 mm polyp was found in the ascending colon. The polyp was sessile.       The polyp was removed with a cold biopsy forceps. Resection and       retrieval were complete.      Multiple small-mouthed diverticula were found in the sigmoid colon.      Non-bleeding internal hemorrhoids were found during retroflexion. The       hemorrhoids were small and Grade I (internal hemorrhoids that do not       prolapse). Impression:           - One 5 mm polyp in the ascending colon, removed with a                        cold biopsy forceps. Resected and retrieved.                       - Diverticulosis in the sigmoid colon.                       - Non-bleeding internal hemorrhoids. Recommendation:       - Discharge patient to home (with escort).                       - Resume previous diet.                       - Continue present medications.                       - Await pathology results.                       - Repeat colonoscopy in 5 years for surveillance.                       - Return to GI office in 6 weeks. Procedure Code(s):    --- Professional ---                       [id number], Colonoscopy, flexible; with biopsy, single or                        multiple Diagnosis Code(s):    --- Professional ---  Z80.0, Family history of malignant neoplasm of                        digestive organs                       K64.0, First degree hemorrhoids                       D12.2, Benign neoplasm of ascending colon                       K57.30, Diverticulosis of large intestine without                        perforation or abscess without bleeding CPT copyright 2016 American Medical Association. All rights reserved. The codes documented in this report are preliminary  and upon coder review may  be revised to meet current compliance requirements. Jonathon Bellows, MD Jonathon Bellows MD, MD 04/03/2017 12:18:48 PM This report has been signed electronically. Number of Addenda: 0 Note Initiated On: 04/03/2017 11:48 AM Scope Withdrawal Time: 0 hours 7 minutes 48 seconds  Total Procedure Duration: 0 hours 11 minutes 44 seconds       [facility]

## 2017-04-03 NOTE — Op Note (Signed)
[facility] Gastroenterology Patient Name: Courtney King Procedure Date: 04/03/2017 11:49 AM MRN: [id number] Account #: [id number] Date of Birth: [birth year] Admit Type: Outpatient Age: 51 Room: [facility] ENDO ROOM 4 Gender: Female Note Status: Finalized Procedure:            Upper GI endoscopy Indications:          Gastro-esophageal reflux disease Providers:            Jonathon Bellows MD, MD Referring MD:         Leata Mouse (Referring MD) Medicines:            Monitored Anesthesia Care Complications:        No immediate complications. Procedure:            Pre-Anesthesia Assessment:                       - Prior to the procedure, a History and Physical was                        performed, and patient medications, allergies and                        sensitivities were reviewed. The patient's tolerance of                        previous anesthesia was reviewed.                       - The risks and benefits of the procedure and the                        sedation options and risks were discussed with the                        patient. All questions were answered and informed                        consent was obtained.                       - ASA Grade Assessment: III - A patient with severe                        systemic disease.                       After obtaining informed consent, the endoscope was                        passed under direct vision. Throughout the procedure,                        the patient's blood pressure, pulse, and oxygen                        saturations were monitored continuously. The Endoscope                        was introduced through the mouth, and advanced to the  third part of duodenum. The upper GI endoscopy was                        accomplished with ease. The patient tolerated the                        procedure well. Findings:      The examined duodenum was normal.      Diffuse moderate inflammation  characterized by congestion (edema),       erosions and erythema was found in the entire examined stomach. Biopsies       were taken with a cold forceps for histology.      A 1 cm hiatal hernia was present.      Circumferential salmon-colored mucosa was present from 34 to 35 cm. No       other visible abnormalities were present. The maximum longitudinal       extent of these esophageal mucosal changes was 3 cm in length. Biopsies       were taken with a cold forceps for histology.      A single 1 mm mucosal nodule with a localized distribution was found at       the gastroesophageal junction, 36 cm from the incisors. No biopsies       taken - it could represent a thickened upper margin of gastric fold but       was in opposition to the salmon colored mucosa suspicious for Barrettes. Impression:           - Normal examined duodenum.                       - Gastritis. Biopsied.                       - 1 cm hiatal hernia.                       - Salmon-colored mucosa suspicious for short-segment                        Barrett's esophagus. Biopsied.                       - Mucosal nodule found in the esophagus. Recommendation:       - Await pathology results.                       - Refer to Dr Tona Sensing for EUS and evaluation of                        possible nodule vs thickened upper aspect of gastric                        fold seen close to areas suspicious for Barrettes.                       - Perform a colonoscopy today. Procedure Code(s):    --- Professional ---                       [phone], Esophagogastroduodenoscopy, flexible, transoral;  with biopsy, single or multiple Diagnosis Code(s):    --- Professional ---                       K22.8, Other specified diseases of esophagus                       K29.70, Gastritis, unspecified, without bleeding                       K44.9, Diaphragmatic hernia without obstruction or                        gangrene                        K21.9, Gastro-esophageal reflux disease without                        esophagitis CPT copyright 2016 American Medical Association. All rights reserved. The codes documented in this report are preliminary and upon coder review may  be revised to meet current compliance requirements. Jonathon Bellows, MD Jonathon Bellows MD, MD 04/03/2017 12:03:32 PM This report has been signed electronically. Number of Addenda: 0 Note Initiated On: 04/03/2017 11:49 AM      [facility]

## 2017-04-03 NOTE — Anesthesia Postprocedure Evaluation (Signed)
Anesthesia Post Note  Patient: Courtney King  Procedure(s) Performed: Procedure(s) (LRB): COLONOSCOPY WITH PROPOFOL (N/A) ESOPHAGOGASTRODUODENOSCOPY (EGD) WITH PROPOFOL (N/A)  Patient location during evaluation: PACU Anesthesia Type: General Level of consciousness: awake and alert and oriented Pain management: pain level controlled Vital Signs Assessment: post-procedure vital signs reviewed and stable Respiratory status: spontaneous breathing Cardiovascular status: blood pressure returned to baseline Anesthetic complications: no     Last Vitals:  Vitals:   04/03/17 1244 04/03/17 1254  BP: 121/72 (!) 149/76  Pulse: 79 88  Resp: 16 16  Temp:      Last Pain:  Vitals:   04/03/17 1224  TempSrc: Tympanic                 [name]

## 2017-04-03 NOTE — Anesthesia Post-op Follow-up Note (Cosign Needed)
Anesthesia QCDR form completed.        

## 2017-04-03 NOTE — H&P (Signed)
Jonathon Bellows MD [address], [place], [place] [address] Phone: [phone] Fax : [phone]  Primary Care Physician:  Luciana Axe, NP Primary Gastroenterologist:  Dr. Jonathon Bellows   Pre-Procedure History & Physical: HPI:  Courtney King is a 51 y.o. female is here for an endoscopy and colonoscopy.   Past Medical History:  Diagnosis Date  . Allergy   . Anxiety   . Diabetes mellitus without complication ([place])   . GERD (gastroesophageal reflux disease)   . H/O blood clots 2014   in abdominal aorta - endarterectomy at [facility]  . Hyperlipidemia   . Hypertension   . Left leg claudication [facility])     Past Surgical History:  Procedure Laterality Date  . ABDOMINAL HYSTERECTOMY  2004  . AORTIC ENDARTERECETOMY  2013  . CHOLECYSTECTOMY  1999  . FOOT SURGERY Left    plantar fasciitis  . OOPHORECTOMY Bilateral 2004    Prior to Admission medications   Medication Sig Start Date End Date Taking? Authorizing Provider  hydrOXYzine (ATARAX/VISTARIL) 25 MG tablet Take 25 mg by mouth 3 (three) times daily as needed.   Yes [provider]  aspirin EC 81 MG tablet Take 81 mg by mouth at bedtime.    [provider]  Blood Glucose Monitoring Suppl (ONETOUCH VERIO) w/Device KIT 1 each by Does not apply route once. 03/12/16   Luciana Axe, NP  dexlansoprazole (DEXILANT) 60 MG capsule Take 1 capsule (60 mg total) by mouth daily. 03/19/17   Jonathon Bellows, MD  docusate sodium (COLACE) 100 MG capsule Take 100 mg by mouth. 08/30/12   [provider]  glucose blood (ONETOUCH VERIO) test strip Check blood sugar once daily. 03/12/16   Luciana Axe, NP  Lancet Devices ([employer] LANCING DEVICE) MISC Check blood sugar daily. 03/12/16   [provider]  lisinopril (PRINIVIL,ZESTRIL) 20 MG tablet Take 0.5 tablets (10 mg total) by mouth at bedtime. 04/04/16   Luciana Axe, NP  metFORMIN (GLUCOPHAGE) 500 MG tablet Take 1 tablet (500 mg total) by mouth 2 (two) times  daily with a meal. 03/07/16   Krebs, Genevie Cheshire, NP  ONE TOUCH LANCETS MISC Check blood sugar daily. 03/12/16   Luciana Axe, NP  polyethylene glycol powder (GLYCOLAX/MIRALAX) powder Take by mouth. 08/30/12   [provider]  promethazine (PHENERGAN) 12.5 MG tablet Take 1 tablet (12.5 mg total) by mouth every 6 (six) hours as needed for nausea or vomiting. 02/23/17   Triplett, Johnette Abraham B, FNP  ranitidine (ZANTAC) 150 MG tablet Take 1 tablet (150 mg total) by mouth 2 (two) times daily. 06/20/16   Luciana Axe, NP  venlafaxine XR (EFFEXOR-XR) 75 MG 24 hr capsule Take 1 capsule (75 mg total) by mouth daily with breakfast. 03/07/16   Vincenza Hews, Genevie Cheshire, NP  Vitamin D, Ergocalciferol, (DRISDOL) 50000 units CAPS capsule Take 1 capsule by mouth  every 7 days Patient not taking: Reported on 03/10/2017 06/20/16   Luciana Axe, NP    Allergies as of 03/10/2017 - Review Complete 03/10/2017  Allergen Reaction Noted  . Codeine Other (See Comments), Nausea And Vomiting, and Nausea Only 08/15/2014    Family History  Problem Relation Age of Onset  . Cancer Mother        ovarian  . Cancer Maternal Aunt        ovarian    Social History   Social History  . Marital status: Married    Spouse name: N/A  . Number  of children: N/A  . Years of education: N/A   Occupational History  . Not on file.   Social History Main Topics  . Smoking status: Current Every Day Smoker    Packs/day: 1.00    Years: 20.00  . Smokeless tobacco: Current User  . Alcohol use No  . Drug use: No  . Sexual activity: Not on file   Other Topics Concern  . Not on file   Social History Narrative  . No narrative on file    Review of Systems: See HPI, otherwise negative ROS  Physical Exam: BP 115/70   Pulse 86   Temp (!) 96.8 F (36 C) (Tympanic)   Resp 16   Ht 5' 2" (1.575 m)   Wt 155 lb (70.3 kg)   SpO2 97%   BMI 28.35 kg/m  General:   Alert,  pleasant and cooperative in NAD Head:  Normocephalic  and atraumatic. Neck:  Supple; no masses or thyromegaly. Lungs:  Clear throughout to auscultation.    Heart:  Regular rate and rhythm. Abdomen:  Soft, nontender and nondistended. Normal bowel sounds, without guarding, and without rebound.   Neurologic:  Alert and  oriented x4;  grossly normal neurologically.  Impression/Plan: Courtney King is here for an endoscopy and colonoscopy to be performed for GERD and colorectal cancer screening with a family history of colon cancer   Risks, benefits, limitations, and alternatives regarding  endoscopy and colonoscopy have been reviewed with the patient.  Questions have been answered.  All parties agreeable.   Jonathon Bellows, MD  04/03/2017, 11:43 AM

## 2017-04-03 NOTE — Transfer of Care (Signed)
Immediate Anesthesia Transfer of Care Note  Patient: Courtney King  Procedure(s) Performed: Procedure(s): COLONOSCOPY WITH PROPOFOL (N/A) ESOPHAGOGASTRODUODENOSCOPY (EGD) WITH PROPOFOL (N/A)  Patient Location: PACU and Endoscopy Unit  Anesthesia Type:General  Level of Consciousness: sedated  Airway & Oxygen Therapy: Patient Spontanous Breathing and Patient connected to nasal cannula oxygen  Post-op Assessment: Report given to RN and Post -op Vital signs reviewed and stable  Post vital signs: Reviewed and stable  Last Vitals:  Vitals:   04/03/17 1055  BP: 115/70  Pulse: 86  Resp: 16  Temp: (!) 36 C    Complications: No apparent anesthesia complications

## 2017-04-06 ENCOUNTER — Telehealth: Payer: Self-pay

## 2017-04-06 ENCOUNTER — Other Ambulatory Visit: Payer: Self-pay

## 2017-04-06 LAB — SURGICAL PATHOLOGY

## 2017-04-06 NOTE — Telephone Encounter (Signed)
  Oncology Nurse Navigator Documentation Received referral from Dr. Vicente Males for upper EUS of mucosal nodule. EUS scheduled for 04/30/17 with Dr. Francella Solian at [facility]. Went over instructions with Courtney King and copy of instructions mailed to home address, Instructed to hold oral diabetic medications the day of her procedure. Only anticoagulant is aspirin 81 mg. Provided my contact information for any further questions or concerns.  INSTRUCTIONS FOR ENDOSCOPIC ULTRASOUND -Your procedure has been scheduled for June 14th with Dr. Francella Solian at [facility]. -The hospital may contact you to pre-register over the phone.  -To get your scheduled arrival time, please call the Endoscopy unit at  [phone] between 1-3 p.m. on:  June 13th    -ON THE DAY OF YOU PROCEDURE:   1. If you are scheduled for a morning procedure, nothing to drink after midnight  -If you are scheduled for an afternoon procedure, you may have clear liquids until 5 hours prior  to the procedure but no carbonated drinks or broth  2. NO FOOD THE DAY OF YOUR PROCEDURE  3. You may take your heart, seizure, blood pressure, Parkinson's or breathing medications at  6am with just enough water to get your pills down  4. Do not take any oral Diabetic medications the morning of your procedure.  5. If you are a diabetic and are using insulin, please notify your prescribing physician of this  procedure as your dose may need to be altered related to not being able to eat or drink.   5. Do not take vitamins, iron, or fish oil for 5 days before your procedure     -On the day of your procedure, come to the [facility] Admitting/Registration desk (First desk on the right) at the scheduled arrival time. You MUST have someone drive you home from your procedure. You must have a responsible adult with a valid driver's license who is on site throughout your entire procedure and who can stay with you for several hours after your procedure.  You may not go home alone in a taxi, shuttle [place] or bus, as the drivers will not be responsible for you.  --If you have any questions please call me at the above contact  Navigator Location: [facility] (04/06/17 1000)   )Navigator Encounter Type: Telephone (04/06/17 1000) Telephone: Courtney King Call (04/06/17 1000)                       Barriers/Navigation Needs: Coordination of Care (04/06/17 1000)   Interventions: Coordination of Care (04/06/17 1000)   Coordination of Care: EUS (04/06/17 1000)             Time Spent with Patient: 30 (04/06/17 1000)

## 2017-04-06 NOTE — Telephone Encounter (Signed)
Per Mariea Clonts, pt scheduled with Dr. Francella Solian @ [facility]

## 2017-04-09 ENCOUNTER — Encounter: Payer: Self-pay | Admitting: Gastroenterology

## 2017-04-23 ENCOUNTER — Other Ambulatory Visit: Payer: Self-pay

## 2017-04-30 ENCOUNTER — Ambulatory Visit: Payer: 59 | Admitting: Anesthesiology

## 2017-04-30 ENCOUNTER — Encounter
Admission: RE | Disposition: A | Discharge status: Home / Self Care | Payer: Self-pay | Source: Ambulatory Visit | Attending: Gastroenterology

## 2017-04-30 ENCOUNTER — Ambulatory Visit
Admission: RE | Admit: 2017-04-30 | Discharge: 2017-04-30 | Disposition: A | Discharge status: Home / Self Care | Payer: 59 | Source: Ambulatory Visit | Attending: Gastroenterology | Admitting: Gastroenterology

## 2017-04-30 ENCOUNTER — Encounter: Payer: Self-pay | Admitting: *Deleted

## 2017-04-30 DIAGNOSIS — Z7982 Long term (current) use of aspirin: Secondary | ICD-10-CM | POA: Insufficient documentation

## 2017-04-30 DIAGNOSIS — F419 Anxiety disorder, unspecified: Secondary | ICD-10-CM | POA: Diagnosis not present

## 2017-04-30 DIAGNOSIS — Z7984 Long term (current) use of oral hypoglycemic drugs: Secondary | ICD-10-CM | POA: Insufficient documentation

## 2017-04-30 DIAGNOSIS — K228 Other specified diseases of esophagus: Secondary | ICD-10-CM | POA: Diagnosis present

## 2017-04-30 DIAGNOSIS — Z800 Family history of malignant neoplasm of digestive organs: Secondary | ICD-10-CM | POA: Diagnosis not present

## 2017-04-30 DIAGNOSIS — Z79899 Other long term (current) drug therapy: Secondary | ICD-10-CM | POA: Diagnosis not present

## 2017-04-30 DIAGNOSIS — K3189 Other diseases of stomach and duodenum: Secondary | ICD-10-CM | POA: Insufficient documentation

## 2017-04-30 DIAGNOSIS — I10 Essential (primary) hypertension: Secondary | ICD-10-CM | POA: Diagnosis not present

## 2017-04-30 DIAGNOSIS — K210 Gastro-esophageal reflux disease with esophagitis: Secondary | ICD-10-CM | POA: Insufficient documentation

## 2017-04-30 DIAGNOSIS — F17210 Nicotine dependence, cigarettes, uncomplicated: Secondary | ICD-10-CM | POA: Insufficient documentation

## 2017-04-30 DIAGNOSIS — E119 Type 2 diabetes mellitus without complications: Secondary | ICD-10-CM | POA: Insufficient documentation

## 2017-04-30 HISTORY — PX: EUS: SHX5427

## 2017-04-30 LAB — GLUCOSE, CAPILLARY: Glucose-Capillary: 96 mg/dL (ref 65–99)

## 2017-04-30 SURGERY — ULTRASOUND, UPPER GI TRACT, ENDOSCOPIC
Anesthesia: General

## 2017-04-30 MED ORDER — PROPOFOL 500 MG/50ML IV EMUL
INTRAVENOUS | Status: DC | PRN
  Administered 2017-04-30: 140 ug/kg/min via INTRAVENOUS

## 2017-04-30 MED ORDER — SODIUM CHLORIDE 0.9 % IV SOLN
INTRAVENOUS | Status: DC
  Administered 2017-04-30: 13:00:00 via INTRAVENOUS

## 2017-04-30 MED ORDER — PHENYLEPHRINE HCL 10 MG/ML IJ SOLN
INTRAMUSCULAR | Status: DC | PRN
  Administered 2017-04-30 (×2): 100 ug via INTRAVENOUS

## 2017-04-30 MED ORDER — PROPOFOL 500 MG/50ML IV EMUL
INTRAVENOUS | Status: AC
  Filled 2017-04-30: qty 50

## 2017-04-30 MED ORDER — MIDAZOLAM HCL 2 MG/2ML IJ SOLN
INTRAMUSCULAR | Status: AC
  Filled 2017-04-30: qty 2

## 2017-04-30 MED ORDER — LIDOCAINE 2% (20 MG/ML) 5 ML SYRINGE
INTRAMUSCULAR | Status: DC | PRN
Start: 2017-04-30 — End: 2017-04-30
  Administered 2017-04-30: 40 mg via INTRAVENOUS

## 2017-04-30 MED ORDER — PROPOFOL 10 MG/ML IV BOLUS
INTRAVENOUS | Status: DC | PRN
  Administered 2017-04-30: 100 mg via INTRAVENOUS

## 2017-04-30 MED ORDER — FENTANYL CITRATE (PF) 100 MCG/2ML IJ SOLN
INTRAMUSCULAR | Status: DC | PRN
  Administered 2017-04-30 (×2): 50 ug via INTRAVENOUS

## 2017-04-30 MED ORDER — MIDAZOLAM HCL 5 MG/5ML IJ SOLN
INTRAMUSCULAR | Status: DC | PRN
  Administered 2017-04-30: 1 mg via INTRAVENOUS

## 2017-04-30 MED ORDER — FENTANYL CITRATE (PF) 100 MCG/2ML IJ SOLN
INTRAMUSCULAR | Status: AC
  Filled 2017-04-30: qty 2

## 2017-04-30 NOTE — Anesthesia Post-op Follow-up Note (Cosign Needed)
Anesthesia QCDR form completed.        

## 2017-04-30 NOTE — Interval H&P Note (Signed)
History and Physical Interval Note:  04/30/2017 1:37 PM  Courtney King  has presented today for surgery, with the diagnosis of mucosal nodule at gastroespohageal junction  The various methods of treatment have been discussed with the patient and family. After consideration of risks, benefits and other options for treatment, the patient has consented to  Procedure(s): FULL UPPER ENDOSCOPIC ULTRASOUND (EUS) RADIAL (N/A) as a surgical intervention .  The patient's history has been reviewed, patient examined, no change in status, stable for surgery.  I have reviewed the patient's chart and labs.  Questions were answered to the patient's satisfaction.  No significant abd pain. Abd soft nt.    Zada Girt

## 2017-04-30 NOTE — H&P (View-Only) (Signed)
Courtney Stenseth MD [address] [place], [place] [address] Phone: [phone] Fax : [phone]  Primary Care Physician:  Krebs, Amy Lauren, NP Primary Gastroenterologist:  Dr. Marvalene Barrett   Pre-Procedure History & Physical: HPI:  Courtney King is a 51 y.o. female is here for an endoscopy and colonoscopy.   Past Medical History:  Diagnosis Date  . Allergy   . Anxiety   . Diabetes mellitus without complication ([facility])   . GERD (gastroesophageal reflux disease)   . H/O blood clots 2014   in abdominal aorta - endarterectomy at [facility]  . Hyperlipidemia   . Hypertension   . Left leg claudication ([facility])     Past Surgical History:  Procedure Laterality Date  . ABDOMINAL HYSTERECTOMY  2004  . AORTIC ENDARTERECETOMY  2013  . CHOLECYSTECTOMY  1999  . FOOT SURGERY Left    plantar fasciitis  . OOPHORECTOMY Bilateral 2004    Prior to Admission medications   Medication Sig Start Date End Date Taking? Authorizing Provider  hydrOXYzine (ATARAX/VISTARIL) 25 MG tablet Take 25 mg by mouth 3 (three) times daily as needed.   Yes [provider]  aspirin EC 81 MG tablet Take 81 mg by mouth at bedtime.    [provider]  Blood Glucose Monitoring Suppl (ONETOUCH VERIO) w/Device KIT 1 each by Does not apply route once. 03/12/16   Krebs, Amy Lauren, NP  dexlansoprazole (DEXILANT) 60 MG capsule Take 1 capsule (60 mg total) by mouth daily. 03/19/17   Jameca Chumley, MD  docusate sodium (COLACE) 100 MG capsule Take 100 mg by mouth. 08/30/12   [provider]  glucose blood (ONETOUCH VERIO) test strip Check blood sugar once daily. 03/12/16   Krebs, Amy Lauren, NP  Lancet Devices ([employer] LANCING DEVICE) MISC Check blood sugar daily. 03/12/16   [provider]  lisinopril (PRINIVIL,ZESTRIL) 20 MG tablet Take 0.5 tablets (10 mg total) by mouth at bedtime. 04/04/16   Krebs, Amy Lauren, NP  metFORMIN (GLUCOPHAGE) 500 MG tablet Take 1 tablet (500 mg total) by mouth 2 (two) times  daily with a meal. 03/07/16   Krebs, Amy Lauren, NP  ONE TOUCH LANCETS MISC Check blood sugar daily. 03/12/16   Krebs, Amy Lauren, NP  polyethylene glycol powder (GLYCOLAX/MIRALAX) powder Take by mouth. 08/30/12   [provider]  promethazine (PHENERGAN) 12.5 MG tablet Take 1 tablet (12.5 mg total) by mouth every 6 (six) hours as needed for nausea or vomiting. 02/23/17   Triplett, Cari B, FNP  ranitidine (ZANTAC) 150 MG tablet Take 1 tablet (150 mg total) by mouth 2 (two) times daily. 06/20/16   Krebs, Amy Lauren, NP  venlafaxine XR (EFFEXOR-XR) 75 MG 24 hr capsule Take 1 capsule (75 mg total) by mouth daily with breakfast. 03/07/16   Krebs, Amy Lauren, NP  Vitamin D, Ergocalciferol, (DRISDOL) 50000 units CAPS capsule Take 1 capsule by mouth  every 7 days Patient not taking: Reported on 03/10/2017 06/20/16   Krebs, Amy Lauren, NP    Allergies as of 03/10/2017 - Review Complete 03/10/2017  Allergen Reaction Noted  . Codeine Other (See Comments), Nausea And Vomiting, and Nausea Only 08/15/2014    Family History  Problem Relation Age of Onset  . Cancer Mother        ovarian  . Cancer Maternal Aunt        ovarian    Social History   Social History  . Marital status: Married    Spouse name: N/A  . Number   of children: N/A  . Years of education: N/A   Occupational History  . Not on file.   Social History Main Topics  . Smoking status: Current Every Day Smoker    Packs/day: 1.00    Years: 20.00  . Smokeless tobacco: Current User  . Alcohol use No  . Drug use: No  . Sexual activity: Not on file   Other Topics Concern  . Not on file   Social History Narrative  . No narrative on file    Review of Systems: See HPI, otherwise negative ROS  Physical Exam: BP 115/70   Pulse 86   Temp (!) 96.8 F (36 C) (Tympanic)   Resp 16   Ht 5' 2" (1.575 m)   Wt 155 lb (70.3 kg)   SpO2 97%   BMI 28.35 kg/m  General:   Alert,  pleasant and cooperative in NAD Head:  Normocephalic  and atraumatic. Neck:  Supple; no masses or thyromegaly. Lungs:  Clear throughout to auscultation.    Heart:  Regular rate and rhythm. Abdomen:  Soft, nontender and nondistended. Normal bowel sounds, without guarding, and without rebound.   Neurologic:  Alert and  oriented x4;  grossly normal neurologically.  Impression/Plan: Courtney King is here for an endoscopy and colonoscopy to be performed for GERD and colorectal cancer screening with a family history of colon cancer   Risks, benefits, limitations, and alternatives regarding  endoscopy and colonoscopy have been reviewed with the patient.  Questions have been answered.  All parties agreeable.   Jonathon Bellows, MD  04/03/2017, 11:43 AM

## 2017-04-30 NOTE — Transfer of Care (Signed)
Immediate Anesthesia Transfer of Care Note  Patient: Courtney King  Procedure(s) Performed: Procedure(s): FULL UPPER ENDOSCOPIC ULTRASOUND (EUS) RADIAL (N/A)  Patient Location: PACU and Endoscopy Unit  Anesthesia Type:General  Level of Consciousness: awake, drowsy and patient cooperative  Airway & Oxygen Therapy: Patient Spontanous Breathing and Patient connected to nasal cannula oxygen  Post-op Assessment: Report given to RN and Post -op Vital signs reviewed and stable  Post vital signs: Reviewed  Last Vitals: There were no vitals filed for this visit.  Last Pain: There were no vitals filed for this visit.       Complications: No apparent anesthesia complications

## 2017-04-30 NOTE — Anesthesia Preprocedure Evaluation (Signed)
Anesthesia Evaluation  Patient identified by MRN, date of birth, ID band Patient awake    Reviewed: Allergy & Precautions, NPO status , Patient's Chart, lab work & pertinent test results  History of Anesthesia Complications Negative for: history of anesthetic complications  Airway Mallampati: II  TM Distance: >3 FB Neck ROM: Full    Dental no notable dental hx.    Pulmonary neg sleep apnea, neg COPD, Current Smoker,    breath sounds clear to auscultation- rhonchi (-) wheezing      Cardiovascular Exercise Tolerance: Good hypertension, Pt. on medications (-) CAD and (-) Past MI  Rhythm:Regular Rate:Normal - Systolic murmurs and - Diastolic murmurs    Neuro/Psych PSYCHIATRIC DISORDERS Anxiety negative neurological ROS     GI/Hepatic Neg liver ROS, GERD  ,  Endo/Other  diabetes, Type 2, Oral Hypoglycemic Agents  Renal/GU negative Renal ROS     Musculoskeletal negative musculoskeletal ROS (+)   Abdominal (+) + obese,   Peds  Hematology negative hematology ROS (+)   Anesthesia Other Findings Past Medical History: No date: Allergy No date: Anxiety No date: Diabetes mellitus without complication ([facility]) No date: GERD (gastroesophageal reflux disease) 2014: H/O blood clots     Comment: in abdominal aorta - endarterectomy at [facility] No date: Hyperlipidemia No date: Hypertension No date: Left leg claudication ([facility])   Reproductive/Obstetrics                             Anesthesia Physical Anesthesia Plan  ASA: III  Anesthesia Plan: General   Post-op Pain Management:    Induction: Intravenous  PONV Risk Score and Plan: 1 and Propofol  Airway Management Planned: Natural Airway  Additional Equipment:   Intra-op Plan:   Post-operative Plan:   Informed Consent: I have reviewed the patients History and Physical, chart, labs and discussed the procedure including the risks, benefits and  alternatives for the proposed anesthesia with the patient or authorized representative who has indicated his/her understanding and acceptance.   Dental advisory given  Plan Discussed with: CRNA and Anesthesiologist  Anesthesia Plan Comments:         Anesthesia Quick Evaluation

## 2017-04-30 NOTE — Op Note (Signed)
[facility] Patient Name: Courtney King Procedure Date: 04/30/2017 1:51 PM MRN: [id number] Account #: [id number] Date of Birth: [birth year] Admit Type: Outpatient Age: 51 Room: [facility] ENDO ROOM 3 Gender: Female Note Status: Finalized Procedure:            Upper EUS Indications:          Esophageal mucosal mass/polyp found on endoscopy Providers:            Zada Girt Referring MD:         Jonathon Bellows MD, MD (Referring MD), Leata Mouse                        (Referring MD) Medicines:            Monitored Anesthesia Care Complications:        No immediate complications. Procedure:            Pre-Anesthesia Assessment:                       - Please see pre-anesthesia assessment documentation                        already completed in Epic.                       After obtaining informed consent, the endoscope was                        passed under direct vision. Throughout the procedure,                        the patient's blood pressure, pulse, and oxygen                        saturations were monitored continuously. The EUS GI                        Radial Array [id number] was introduced through the mouth,                        and advanced to the stomach for ultrasound examination                        from the esophagus and stomach. The Endoscope was                        introduced through the mouth, and advanced to the                        second part of duodenum. The upper EUS was accomplished                        without difficulty. The patient tolerated the procedure                        well. Findings:      Endoscopic Finding :      There were esophageal mucosal changes suspicious for short-segment       Barrett's esophagus, classified as Barrett's stage C0-M2 per [place]       criteria present in the lower third  of the esophagus. Z line was at 32       cm and GE junction at 34 cm. The maximum longitudinal extent of these   mucosal changes was 2 cm in length. Biopsies were taken with a cold       forceps for histology. Verification of patient identification for the       specimen was done.      The gastroesophageal junction and cardia were normal.      Patchy mildly erythematous mucosa without bleeding was found in the       gastric antrum.      The examined duodenum was endoscopically normal.      Endosonographic Finding :      Endosonographic images of the stomach were unremarkable in the region of       the cardia/ GE junction where the folds were noted.. No masses and no       wall thickening were identified.      No abnormal-appearing lymph nodes were seen during endosonographic       examination in the paracardial region (level 16) and in the celiac       region (level 20).      Endosonographic imaging in the visualized portion of the liver showed no       lesion. Impression:           - Esophageal mucosal changes suspicious for                        short-segment Barrett's esophagus, classified as                        Barrett's stage C0-M2 per [place] criteria. Biopsied                        (prior biopsies were negative but given suspicion on                        endoscopy, another biopsy performed today.                       - Normal gastroesophageal junction and cardia. Normal                        appearing folds seen; not polyp or nodule.                       - Erythematous mucosa in the antrum. Previously                        biopsied.                       - Normal examined duodenum.                       EUS:                       - Endosonographic images of the stomach in the regionn                        of the carida and GE junction were unremarkable. Recommendation:       - Await path results.                       -  Patient has a contact number available for                        emergencies. The signs and symptoms of potential                        delayed complications  were discussed with the patient.                        Return to normal activities tomorrow. Written discharge                        instructions were provided to the patient.                       - Return to referring physician. If Barrett's seen no                        biopsied will follow up with Dr. Vicente Males for surveillance.                       - The findings and recommendations were discussed with                        the patient.                       - The findings and recommendations were discussed with                        the designated responsible adult. Procedure Code(s):    --- Professional ---                       [phone], Esophagogastroduodenoscopy, flexible, transoral;                        with endoscopic ultrasound examination limited to the                        esophagus, stomach or duodenum, and adjacent structures Diagnosis Code(s):    --- Professional ---                       K22.70, Barrett's esophagus without dysplasia                       K31.89, Other diseases of stomach and duodenum                       K22.8, Other specified diseases of esophagus CPT copyright 2016 American Medical Association. All rights reserved. The codes documented in this report are preliminary and upon coder review may  be revised to meet current compliance requirements. Zada Girt,  04/30/2017 2:47:40 PM This report has been signed electronically. Number of Addenda: 0 Note Initiated On: 04/30/2017 1:51 PM      [facility]

## 2017-05-01 NOTE — Anesthesia Postprocedure Evaluation (Signed)
Anesthesia Post Note  Patient: Courtney King  Procedure(s) Performed: Procedure(s) (LRB): FULL UPPER ENDOSCOPIC ULTRASOUND (EUS) RADIAL (N/A)  Patient location during evaluation: PACU Anesthesia Type: General Level of consciousness: awake and alert and oriented Pain management: pain level controlled Vital Signs Assessment: post-procedure vital signs reviewed and stable Respiratory status: spontaneous breathing, nonlabored ventilation and respiratory function stable Cardiovascular status: blood pressure returned to baseline and stable Postop Assessment: no signs of nausea or vomiting Anesthetic complications: no     Last Vitals:  Vitals:   04/30/17 1446 04/30/17 1456  BP: (!) 113/56 117/69  Pulse: 77 78  Resp: 16 16  Temp:      Last Pain:  Vitals:   05/01/17 0742  TempSrc:   PainSc: 0-No pain                 Pecolia Marando

## 2017-05-04 ENCOUNTER — Encounter: Payer: Self-pay | Admitting: Gastroenterology

## 2017-05-05 ENCOUNTER — Encounter: Payer: Self-pay | Admitting: Gastroenterology

## 2017-05-05 LAB — SURGICAL PATHOLOGY

## 2017-05-11 ENCOUNTER — Telehealth: Payer: Self-pay

## 2017-05-11 NOTE — Telephone Encounter (Signed)
-----   Message from Jonathon Bellows, MD sent at 05/10/2017  3:58 PM EDT ----- Inform mostly inflammation seen from reflux , suggest stay on PPI repeat EGD in 3 years and see me in the  Office in 4 months   ----- Message ----- From: Clent Jacks, RN Sent: 05/05/2017   3:11 PM To: Jonathon Bellows, MD

## 2017-05-11 NOTE — Telephone Encounter (Signed)
Advised patient of results per Dr. Vicente Males.   Inform mostly inflammation seen from reflux , suggest stay on PPI repeat EGD in 3 years and see me in the Office in 4 months

## 2017-07-27 ENCOUNTER — Other Ambulatory Visit: Payer: Self-pay

## 2017-07-27 DIAGNOSIS — K219 Gastro-esophageal reflux disease without esophagitis: Secondary | ICD-10-CM

## 2017-07-27 MED ORDER — DEXLANSOPRAZOLE 60 MG PO CPDR: 60.0000 mg | DELAYED_RELEASE_CAPSULE | Freq: Every day | ORAL | 3 refills | Status: DC

## 2017-08-31 ENCOUNTER — Other Ambulatory Visit: Payer: Self-pay

## 2017-08-31 ENCOUNTER — Telehealth: Payer: Self-pay | Admitting: Gastroenterology

## 2017-08-31 MED ORDER — SUCRALFATE 1 G PO TABS: 1.0000 g | ORAL_TABLET | Freq: Three times a day (TID) | ORAL | 3 refills | Status: DC

## 2017-08-31 NOTE — Telephone Encounter (Signed)
Patient called and is having severe stomach pains. The Dexilant is not working. Please call patient.

## 2017-08-31 NOTE — Telephone Encounter (Signed)
Yes please add Carafate to this patient's medication and see if it helps.

## 2017-08-31 NOTE — Telephone Encounter (Signed)
Sent Rx for Carafate tabs to patient's pharmacy per Dr. Allen Norris.  Advised pt of Rx for pick-up.

## 2017-09-01 ENCOUNTER — Other Ambulatory Visit: Payer: Self-pay

## 2017-10-19 ENCOUNTER — Other Ambulatory Visit: Payer: Self-pay | Admitting: Gastroenterology

## 2017-11-17 HISTORY — PX: LAPAROSCOPIC NISSEN FUNDOPLICATION: SHX1932

## 2017-11-27 ENCOUNTER — Telehealth: Payer: Self-pay | Admitting: Gastroenterology

## 2017-11-27 NOTE — Telephone Encounter (Signed)
Patient called and wants to know if Dr. Vicente Males does the GERD surgery? The medication isn't working for her. Please call

## 2017-11-30 ENCOUNTER — Ambulatory Visit: Payer: 59 | Admitting: Gastroenterology

## 2017-11-30 ENCOUNTER — Encounter: Payer: Self-pay | Admitting: Gastroenterology

## 2017-11-30 VITALS — BP 143/89 | HR 102 | Temp 97.7°F | Ht 62.0 in | Wt 165.8 lb

## 2017-11-30 DIAGNOSIS — K5900 Constipation, unspecified: Secondary | ICD-10-CM | POA: Diagnosis not present

## 2017-11-30 DIAGNOSIS — K219 Gastro-esophageal reflux disease without esophagitis: Secondary | ICD-10-CM

## 2017-11-30 DIAGNOSIS — R110 Nausea: Secondary | ICD-10-CM

## 2017-11-30 DIAGNOSIS — R140 Abdominal distension (gaseous): Secondary | ICD-10-CM

## 2017-11-30 NOTE — Progress Notes (Signed)
Jonathon Bellows MD, MRCP(U.K) [address]  [place]  [place], [place] [address]  Main: [phone]  Fax: [phone]   Primary Care Physician: Luciana Axe, NP  Primary Gastroenterologist:  Dr. Jonathon Bellows   No chief complaint on file.   HPI: Courtney King is a 52 y.o. female    Summary of history :  She is here today for follow-up.  She was initially seen in April 2018 when she was referred to my office for GERD.  At that point of time she was seen at the emergency room in April 2018 for abdominal pain nausea of one day duration.  CT scan of the abdomen showed a mild to have mild diverticulosis of the sigmoid colon and a duodenal diverticulum.  At that time she had nausea and emesis and was referred to see me.  She had been on Protonix since 2013 twice a day for GERD and when seen by me in April 2018 it had stopped working.  She was started on Zantac at night in addition to her PPI which had also not really helped.  She used to wake up in the morning nauseated with sensation of choking.  She would at times take 2 Zantac's together at the same time which seemed to help.  She had lost some weight intentionally.  Mother had colon cancer.  Interval history   April 2018 to November 30, 2017  04/03/2017 upper endoscopy demonstrated inflammation of the duodenum, 1 cm hiatal hernia was present.  Salmon colored mucosa was seen extending subsequent circumferentially from 34-35 cm mark with maximum extent of 3 cm proximally biopsies were taken.  There was a single 10 mm mucosal nodule at the GE junction and close apposition to the area of salmon colored mucosa.  Gastric biopsies demonstrated mild chronic gastritis with no H. pylori.  Esophageal biopsies suggested reflux esophagitis with no intestinal metaplasia.  She underwent an upper endoscopic ultrasound to evaluate the polyp at the GE junction and biopsies were taken which demonstrated moderate chronic active inflammation with, a small  fragment of intetsinal metaplasia,  rare intraepithelial eosinophils.  She underwent a colonoscopy on 04/03/2017 which demonstrated diverticulosis of the sigmoid colon, internal hemorrhoids.  A small 5 mm polyp was excised which was not adenomatous.  She says she is here today to see me because the proton pump inhibitor which she is to take previously is not helping her.  Her symptoms are predominantly nausea which persisted all day long.  Denies any hearing issues balance issues or visual symptoms.  She says she has abdominal bloating and distention.  Sometimes does not have a bowel movement for a few days.  She has had abdominal surgery but she recalls happened some years back when she presented with an obstruction.  She has gained some weight.    Current Outpatient Medications  Medication Sig Dispense Refill  . aspirin EC 81 MG tablet Take 81 mg by mouth at bedtime.    . Blood Glucose Monitoring Suppl (ONETOUCH VERIO) w/Device KIT 1 each by Does not apply route once. 1 kit 0  . dexlansoprazole (DEXILANT) 60 MG capsule Take 1 capsule (60 mg total) by mouth daily. 90 capsule 3  . docusate sodium (COLACE) 100 MG capsule Take 100 mg by mouth.    Marland Kitchen glucose blood (ONETOUCH VERIO) test strip Check blood sugar once daily. 100 each 12  . hydrOXYzine (ATARAX/VISTARIL) 25 MG tablet Take 25 mg by mouth 3 (three) times daily as needed.    Marland Kitchen  Lancet Devices ([employer] LANCING DEVICE) MISC Check blood sugar daily.    Marland Kitchen lisinopril (PRINIVIL,ZESTRIL) 20 MG tablet Take 0.5 tablets (10 mg total) by mouth at bedtime. 90 tablet 3  . meloxicam (MOBIC) 15 MG tablet Take 15 mg by mouth daily.  0  . metFORMIN (GLUCOPHAGE) 500 MG tablet Take 1 tablet (500 mg total) by mouth 2 (two) times daily with a meal. 180 tablet 3  . ONE TOUCH LANCETS MISC Check blood sugar daily. 200 each 12  . polyethylene glycol powder (GLYCOLAX/MIRALAX) powder Take by mouth.    . promethazine (PHENERGAN) 12.5 MG tablet Take 1 tablet (12.5 mg total) by  mouth every 6 (six) hours as needed for nausea or vomiting. 30 tablet 0  . PROMETHEGAN 12.5 MG suppository     . ranitidine (ZANTAC) 150 MG tablet Take 1 tablet (150 mg total) by mouth 2 (two) times daily.    . ranitidine (ZANTAC) 300 MG tablet     . sucralfate (CARAFATE) 1 g tablet Take 1 tablet (1 g total) by mouth 4 (four) times daily -  with meals and at bedtime. 30 tablet 3  . venlafaxine XR (EFFEXOR-XR) 75 MG 24 hr capsule Take 1 capsule (75 mg total) by mouth daily with breakfast. 90 capsule 3  . Vitamin D, Ergocalciferol, (DRISDOL) 50000 units CAPS capsule Take 1 capsule by mouth  every 7 days 12 capsule 2   No current facility-administered medications for this visit.     Allergies as of 11/30/2017 - Review Complete 04/30/2017  Allergen Reaction Noted  . Codeine Other (See Comments), Nausea And Vomiting, and Nausea Only 08/15/2014    ROS:  General: Negative for anorexia, weight loss, fever, chills, fatigue, weakness. ENT: Negative for hoarseness, difficulty swallowing , nasal congestion. CV: Negative for chest pain, angina, palpitations, dyspnea on exertion, peripheral edema.  Respiratory: Negative for dyspnea at rest, dyspnea on exertion, cough, sputum, wheezing.  GI: See history of present illness. GU:  Negative for dysuria, hematuria, urinary incontinence, urinary frequency, nocturnal urination.  Endo: Negative for unusual weight change.    Physical Examination:   There were no vitals taken for this visit.  General: Well-nourished, well-developed in no acute distress.  Eyes: No icterus. Conjunctivae pink. Mouth: Oropharyngeal mucosa moist and pink , no lesions erythema or exudate. Lungs: Clear to auscultation bilaterally. Non-labored. Heart: Regular rate and rhythm, no murmurs rubs or gallops.  Abdomen: Bowel sounds are normal, nontender, nondistended, no hepatosplenomegaly or masses, no abdominal bruits or hernia , no rebound or guarding.   Extremities: No lower  extremity edema. No clubbing or deformities. Neuro: Alert and oriented x 3.  Grossly intact. Skin: Warm and dry, no jaundice.   Psych: Alert and cooperative, normal mood and affect.   Imaging Studies: No results found.  Assessment and Plan:   Courtney King is a 52 y.o. y/o female here to follow up for GERD. She has had an area of salmon colored mucosa C1M3  Seen on EGD in 2018 - biopsies didn't demonstrate intestinal metaplasia but a subsequent EUS and biopsies showed 1 fragment with intestinal metaplasia ? Taken at [facility] . 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 She is keen on surgery to fix the issue. I will refer her to [facility] for a combined GI-surgical opinion for treatment of GERD with surgery and possible underlying barrettes .  A second issue that she has this is persistent nausea which may be either from constipation or from acid or nonacid reflux  or possibly even from small bowel bacterial overgrowth syndrome.  She does consume a fair bit of artificial sugars in her diet which have asked to stop.  Given a two-week samples of Xifaxan.  Linzess 145 mcg to be taken daily for constipation.  I will see her back in 10 days time to see how she is doing.    Dr Jonathon Bellows  MD,MRCP [facility]) Follow up in 10 days  .

## 2017-11-30 NOTE — Addendum Note (Signed)
Addended by: Peggye Ley on: 11/30/2017 02:51 PM   Modules accepted: Orders

## 2017-12-02 ENCOUNTER — Other Ambulatory Visit: Payer: Self-pay

## 2017-12-09 ENCOUNTER — Ambulatory Visit: Payer: 59 | Admitting: Gastroenterology

## 2017-12-09 ENCOUNTER — Ambulatory Visit (INDEPENDENT_AMBULATORY_CARE_PROVIDER_SITE_OTHER): Payer: 59 | Admitting: Gastroenterology

## 2017-12-09 ENCOUNTER — Encounter: Payer: Self-pay | Admitting: Gastroenterology

## 2017-12-09 ENCOUNTER — Other Ambulatory Visit: Payer: Self-pay

## 2017-12-09 VITALS — BP 128/78 | HR 98 | Temp 98.1°F | Ht 62.0 in | Wt 164.4 lb

## 2017-12-09 DIAGNOSIS — K5900 Constipation, unspecified: Secondary | ICD-10-CM | POA: Diagnosis not present

## 2017-12-09 DIAGNOSIS — R110 Nausea: Secondary | ICD-10-CM | POA: Diagnosis not present

## 2017-12-09 DIAGNOSIS — K219 Gastro-esophageal reflux disease without esophagitis: Secondary | ICD-10-CM

## 2017-12-09 DIAGNOSIS — R140 Abdominal distension (gaseous): Secondary | ICD-10-CM

## 2017-12-09 MED ORDER — LINACLOTIDE 145 MCG PO CAPS: 145.0000 ug | ORAL_CAPSULE | Freq: Every day | ORAL | 0 refills | Status: DC

## 2017-12-09 NOTE — Progress Notes (Signed)
Jonathon Bellows MD, MRCP(U.K) [address]  [place]  [place], [place] [address]  Main: [phone]  Fax: [phone]   Primary Care Physician: Luciana Axe, NP  Primary Gastroenterologist:  Dr. Jonathon Bellows   No chief complaint on file.   HPI: Courtney King is a 52 y.o. female   Summary of history :  She is here today for follow-up.  She was initially seen in April 2018 when she was referred to my office for GERD.  At that point of time she was seen at the emergency room in April 2018 for abdominal pain nausea of one day duration.  CT scan of the abdomen showed a mild to have mild diverticulosis of the sigmoid colon and a duodenal diverticulum.  At that time she had nausea and emesis and was referred to see me.  She had been on Protonix since 2013 twice a day for GERD and when seen by me in April 2018 it had stopped working.  She was started on Zantac at night in addition to her PPI which had also not really helped.  She used to wake up in the morning nauseated with sensation of choking.  She would at times take 2 Zantac's together at the same time which seemed to help.  She had lost some weight intentionally.  Mother had colon cancer.04/03/2017 upper endoscopy demonstrated inflammation of the duodenum, 1 cm hiatal hernia was present.  Salmon colored mucosa was seen extending subsequent circumferentially from 34-35 cm mark with maximum extent of 3 cm proximally biopsies were taken.  There was a single 10 mm mucosal nodule at the GE junction and close apposition to the area of salmon colored mucosa.  Gastric biopsies demonstrated mild chronic gastritis with no H. pylori.  Esophageal biopsies suggested reflux esophagitis with no intestinal metaplasia.  She underwent an upper endoscopic ultrasound to evaluate the polyp at the GE junction and biopsies were taken which demonstrated moderate chronic active inflammation with, a small fragment of intetsinal metaplasia,  rare intraepithelial  eosinophils.  She underwent a colonoscopy on 04/03/2017 which demonstrated diverticulosis of the sigmoid colon, internal hemorrhoids.  A small 5 mm polyp was excised which was not adenomatous.  Interval history    November 30, 2017-12/09/17   She saw me at her last visit  because the proton pump inhibitor which she was taking  previously was not helping her.  Her symptoms were  predominantly nausea which persisted all day long.  Denies any hearing issues balance issues or visual symptoms.  She says she has abdominal bloating and distention.  Sometimes does not have a bowel movement for a few days.  She has had abdominal surgery but she recalls happened some years back when she presented with an obstruction.  She has gained some weight.   Feeling better, very minimal nausea, no throwing, overall much better from last visit. Constipation is better. Took xifaxan two weeks. Some minimal left sided abdominal discomfort, very minimal nausea      Current Outpatient Medications  Medication Sig Dispense Refill  . aspirin EC 81 MG tablet Take 81 mg by mouth at bedtime.    . Blood Glucose Monitoring Suppl (ONETOUCH VERIO) w/Device KIT 1 each by Does not apply route once. 1 kit 0  . clonazePAM (KLONOPIN) 0.5 MG tablet Take 0.5 mg by mouth.    . dexlansoprazole (DEXILANT) 60 MG capsule Take 1 capsule (60 mg total) by mouth daily. 90 capsule 3  . docusate sodium (COLACE) 100  MG capsule Take 100 mg by mouth.    Marland Kitchen gemfibrozil (LOPID) 600 MG tablet Take 600 mg by mouth.    Marland Kitchen glucose blood (ONETOUCH VERIO) test strip Check blood sugar once daily. 100 each 12  . hydrOXYzine (ATARAX/VISTARIL) 25 MG tablet Take 25 mg by mouth 3 (three) times daily as needed.    Elmore Guise Devices ([employer] LANCING DEVICE) MISC Check blood sugar daily.    Marland Kitchen lisinopril (PRINIVIL,ZESTRIL) 20 MG tablet Take 0.5 tablets (10 mg total) by mouth at bedtime. 90 tablet 3  . meloxicam (MOBIC) 15 MG tablet Take 15 mg by mouth daily.  0  . metFORMIN  (GLUCOPHAGE) 500 MG tablet Take 1 tablet (500 mg total) by mouth 2 (two) times daily with a meal. 180 tablet 3  . ONE TOUCH LANCETS MISC Check blood sugar daily. 200 each 12  . polyethylene glycol powder (GLYCOLAX/MIRALAX) powder Take by mouth.    . promethazine (PHENERGAN) 12.5 MG tablet Take 1 tablet (12.5 mg total) by mouth every 6 (six) hours as needed for nausea or vomiting. (Patient not taking: Reported on 11/30/2017) 30 tablet 0  . ranitidine (ZANTAC) 300 MG tablet     . sucralfate (CARAFATE) 1 g tablet Take 1 tablet (1 g total) by mouth 4 (four) times daily -  with meals and at bedtime. 30 tablet 3  . venlafaxine XR (EFFEXOR-XR) 75 MG 24 hr capsule Take 1 capsule (75 mg total) by mouth daily with breakfast. 90 capsule 3   No current facility-administered medications for this visit.     Allergies as of 12/09/2017 - Review Complete 11/30/2017  Allergen Reaction Noted  . Codeine Other (See Comments), Nausea And Vomiting, and Nausea Only 08/15/2014    ROS:  General: Negative for anorexia, weight loss, fever, chills, fatigue, weakness. ENT: Negative for hoarseness, difficulty swallowing , nasal congestion. CV: Negative for chest pain, angina, palpitations, dyspnea on exertion, peripheral edema.  Respiratory: Negative for dyspnea at rest, dyspnea on exertion, cough, sputum, wheezing.  GI: See history of present illness. GU:  Negative for dysuria, hematuria, urinary incontinence, urinary frequency, nocturnal urination.  Endo: Negative for unusual weight change.    Physical Examination:   BP 128/78   Pulse 98   Temp 98.1 F (36.7 C) (Oral)   Ht 5' 2"  (1.575 m)   Wt 164 lb 6.4 oz (74.6 kg)   BMI 30.07 kg/m   General: Well-nourished, well-developed in no acute distress.  Eyes: No icterus. Conjunctivae pink. Mouth: Oropharyngeal mucosa moist and pink , no lesions erythema or exudate. Lungs: Clear to auscultation bilaterally. Non-labored. Heart: Regular rate and rhythm, no murmurs  rubs or gallops.  Abdomen: Bowel sounds are normal, nontender, nondistended, no hepatosplenomegaly or masses, no abdominal bruits or hernia , no rebound or guarding.   Extremities: No lower extremity edema. No clubbing or deformities. Neuro: Alert and oriented x 3.  Grossly intact. Skin: Warm and dry, no jaundice.   Psych: Alert and cooperative, normal mood and affect.   Imaging Studies: No results found.  Assessment and Plan:   Courtney King is a 52 y.o. y/o female  here to follow up for GERD. She has had an area of salmon colored mucosa C1M3  Seen on EGD in 2018 - biopsies didn't demonstrate intestinal metaplasia but a subsequent EUS and biopsies showed 1 fragment with intestinal metaplasia ? Taken at [facility] . 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 She is keen on surgery to  fix the issue. I have referred her to Dr Margart Sickles at [facility] for a combined GI-surgical opinion for treatment of GERD with surgery and possible underlying barrettes .  The nausea is much better after last visit after treating constipation and gas with xifaxan and stopping al artificial sugars. She may need a second course of xifaxan if symptoms recur which it can in 50%. I also suggested she may have a component of non acid reflux contributing to her symptoms . She will need to stay on a PPI for the foreseeable future till she decides on her surgery option.Refill of Linzess provided 145 mcg 90 day supply.    Dr Jonathon Bellows  MD,MRCP [facility]) Follow up in 3 months

## 2017-12-11 ENCOUNTER — Telehealth: Payer: Self-pay

## 2017-12-11 NOTE — Telephone Encounter (Signed)
LVM for patient callback to update on Linzess refill.   Sent PA to insurance for clearance to the pharmacy. Waiting for response. Can take up to 72 hours.

## 2017-12-15 ENCOUNTER — Other Ambulatory Visit: Payer: Self-pay

## 2017-12-15 MED ORDER — LINACLOTIDE 145 MCG PO CAPS: 145.0000 ug | ORAL_CAPSULE | Freq: Every day | ORAL | 0 refills | Status: AC

## 2017-12-15 NOTE — Telephone Encounter (Signed)
Patient requested that Rx be sent to [facility] instead of [facility] so she could use the $30 coupon.   Patient also stated she has not received a call from [facility] concerning referral.   Advised patient I would follow-up with [facility] and changed location for Rx.  Called [facility] to give them PA #.    - Spoke to [facility]. Per the notes the patient was not responding to calls. Verified correct phone number.  - Called patient back and gave her [facility] phone number to follow-up on referral directly since we're unable to make appointments for the patient.  [phone]   - Spoke to [facility]. Rx was ran thru and costs $35 with PA. Pt has $30 coupon. Advised patient of this information.

## 2017-12-24 ENCOUNTER — Telehealth: Payer: Self-pay

## 2017-12-24 NOTE — Telephone Encounter (Signed)
Received a referral from Dr. Cammie Sickle for patient to have a screening colonoscopy.   Patient has been referred to Dr. Margart Sickles for possible GERD surgery. She has procedures scheduled with [facility] already.   Patient has decided to wait for the colonoscopy until after the GERD has been addressed unless the colonoscopy is a part of the her GERD surgery process.  Sent a message to Dr. Laroy Apple nurse advising the same.

## 2018-01-14 ENCOUNTER — Other Ambulatory Visit: Payer: Self-pay

## 2018-01-14 ENCOUNTER — Telehealth: Payer: Self-pay

## 2018-01-14 MED ORDER — OMEPRAZOLE 40 MG PO CPDR: 40.0000 mg | DELAYED_RELEASE_CAPSULE | Freq: Every day | ORAL | 0 refills | Status: DC

## 2018-01-14 NOTE — Telephone Encounter (Signed)
Patient called requesting another round of Xifixan  . Per Dr. Vicente Males, samples made available for patient pick-up

## 2018-01-27 ENCOUNTER — Telehealth: Payer: Self-pay | Admitting: Gastroenterology

## 2018-01-27 ENCOUNTER — Telehealth: Payer: Self-pay

## 2018-01-27 NOTE — Telephone Encounter (Signed)
Returned patient's call.   LVM for patient callback for results.   Will advise patient that [facility] has not sent results from study to [place] as yet.

## 2018-01-27 NOTE — Telephone Encounter (Signed)
Patient called for results from [facility] testing.   Advised we have not received any notification as yet.   Pt states they told her 7 business days. I will contact [facility] for follow-up on results.

## 2018-01-27 NOTE — Telephone Encounter (Signed)
Patient left a voice message for results

## 2018-01-28 ENCOUNTER — Telehealth: Payer: Self-pay

## 2018-01-28 ENCOUNTER — Other Ambulatory Visit: Payer: Self-pay

## 2018-01-28 NOTE — Telephone Encounter (Signed)
Advised patient to contact [facility] @ Dr. Suzan Nailer office for [facility] monitoring results. Per Almyra Free, pt was unable to be contacted. Needs to schedule an office visit to review results and discuss proceeding to surgery. [phone].

## 2018-01-28 NOTE — Telephone Encounter (Signed)
Patient called yesterday requesting testing results from [facility].  Advised patient that our office had not received anything from [facility].  She stated that she had a 24-Hr Ph monitoring study.   As a courtesy, I contacted [facility] for the results even though we referred the patient for possible GERD surgery. After numerous calls and transfers research found Dr. Precious Bard ordered the study but his nurse is unavailable to provide any information.   LVM for patient callback to explain the situation to the patient. Will advise patient to contact Dr. Precious Bard directly for the results to his study.

## 2018-03-09 DIAGNOSIS — I2510 Atherosclerotic heart disease of native coronary artery without angina pectoris: Secondary | ICD-10-CM | POA: Insufficient documentation

## 2018-03-09 DIAGNOSIS — E119 Type 2 diabetes mellitus without complications: Secondary | ICD-10-CM | POA: Insufficient documentation

## 2018-03-09 MED ORDER — GEMFIBROZIL 600 MG PO TABS
600.00 | ORAL_TABLET | ORAL | Status: DC
Start: 2018-03-09 — End: 2018-03-09

## 2018-03-09 MED ORDER — CLONAZEPAM 0.5 MG PO TABS
0.50 | ORAL_TABLET | ORAL | Status: DC
Start: 2018-03-10 — End: 2018-03-09

## 2018-03-09 MED ORDER — ALBUTEROL SULFATE HFA 108 (90 BASE) MCG/ACT IN AERS
2.00 | INHALATION_SPRAY | RESPIRATORY_TRACT | Status: DC
Start: ? — End: 2018-03-09

## 2018-03-09 MED ORDER — OXYCODONE HCL 5 MG/5ML PO SOLN
5.00 | ORAL | Status: DC
Start: ? — End: 2018-03-09

## 2018-03-09 MED ORDER — FLUTICASONE PROPIONATE 50 MCG/ACT NA SUSP
2.00 | NASAL | Status: DC
Start: 2018-03-10 — End: 2018-03-09

## 2018-03-09 MED ORDER — VENLAFAXINE HCL ER 75 MG PO CP24
75.00 | ORAL_CAPSULE | ORAL | Status: DC
Start: 2018-03-10 — End: 2018-03-09

## 2018-03-09 MED ORDER — ASPIRIN 81 MG PO CHEW
81.00 | CHEWABLE_TABLET | ORAL | Status: DC
Start: 2018-03-10 — End: 2018-03-09

## 2018-03-09 MED ORDER — GENERIC EXTERNAL MEDICATION
Status: DC
Start: ? — End: 2018-03-09

## 2018-03-09 MED ORDER — DEXTROSE 50 % IV SOLN
12.50 | INTRAVENOUS | Status: DC
Start: ? — End: 2018-03-09

## 2018-03-09 MED ORDER — ACETAMINOPHEN 160 MG/5ML PO LIQD
650.00 | ORAL | Status: DC
Start: 2018-03-09 — End: 2018-03-09

## 2018-03-09 MED ORDER — HEPARIN SODIUM (PORCINE) 5000 UNIT/ML IJ SOLN
5000.00 | INTRAMUSCULAR | Status: DC
Start: 2018-03-09 — End: 2018-03-09

## 2018-03-09 MED ORDER — INSULIN LISPRO 100 UNIT/ML ~~LOC~~ SOLN
0.00 | SUBCUTANEOUS | Status: DC
Start: 2018-03-09 — End: 2018-03-09

## 2018-03-09 MED ORDER — GENERIC EXTERNAL MEDICATION
20.00 | Status: DC
Start: 2018-03-10 — End: 2018-03-09

## 2018-06-21 ENCOUNTER — Other Ambulatory Visit: Payer: Self-pay

## 2018-06-21 ENCOUNTER — Inpatient Hospital Stay
Admission: EM | Admit: 2018-06-21 | Discharge: 2018-06-24 | DRG: 392 | Disposition: A | Discharge status: Home / Self Care | Payer: Managed Care, Other (non HMO) | Attending: Surgery | Admitting: Surgery

## 2018-06-21 ENCOUNTER — Emergency Department: Payer: Managed Care, Other (non HMO)

## 2018-06-21 ENCOUNTER — Encounter: Payer: Self-pay | Admitting: Emergency Medicine

## 2018-06-21 DIAGNOSIS — F17210 Nicotine dependence, cigarettes, uncomplicated: Secondary | ICD-10-CM | POA: Diagnosis present

## 2018-06-21 DIAGNOSIS — R402412 Glasgow coma scale score 13-15, at arrival to emergency department: Secondary | ICD-10-CM | POA: Diagnosis present

## 2018-06-21 DIAGNOSIS — K5732 Diverticulitis of large intestine without perforation or abscess without bleeding: Secondary | ICD-10-CM | POA: Insufficient documentation

## 2018-06-21 DIAGNOSIS — K760 Fatty (change of) liver, not elsewhere classified: Secondary | ICD-10-CM | POA: Diagnosis present

## 2018-06-21 DIAGNOSIS — E1151 Type 2 diabetes mellitus with diabetic peripheral angiopathy without gangrene: Secondary | ICD-10-CM | POA: Diagnosis present

## 2018-06-21 DIAGNOSIS — Z90710 Acquired absence of both cervix and uterus: Secondary | ICD-10-CM

## 2018-06-21 DIAGNOSIS — N321 Vesicointestinal fistula: Secondary | ICD-10-CM | POA: Diagnosis present

## 2018-06-21 DIAGNOSIS — I10 Essential (primary) hypertension: Secondary | ICD-10-CM | POA: Diagnosis present

## 2018-06-21 DIAGNOSIS — Z885 Allergy status to narcotic agent status: Secondary | ICD-10-CM

## 2018-06-21 DIAGNOSIS — Z79899 Other long term (current) drug therapy: Secondary | ICD-10-CM | POA: Diagnosis not present

## 2018-06-21 DIAGNOSIS — K630 Abscess of intestine: Secondary | ICD-10-CM

## 2018-06-21 DIAGNOSIS — K5720 Diverticulitis of large intestine with perforation and abscess without bleeding: Secondary | ICD-10-CM | POA: Diagnosis not present

## 2018-06-21 DIAGNOSIS — Z7982 Long term (current) use of aspirin: Secondary | ICD-10-CM

## 2018-06-21 LAB — COMPREHENSIVE METABOLIC PANEL
ALBUMIN: 3.7 g/dL (ref 3.5–5.0)
ALK PHOS: 88 U/L (ref 38–126)
ALT: 13 U/L (ref 0–44)
AST: 16 U/L (ref 15–41)
Anion gap: 10 (ref 5–15)
BILIRUBIN TOTAL: 0.4 mg/dL (ref 0.3–1.2)
BUN: 11 mg/dL (ref 6–20)
CALCIUM: 9.1 mg/dL (ref 8.9–10.3)
CO2: 26 mmol/L (ref 22–32)
CREATININE: 0.93 mg/dL (ref 0.44–1.00)
Chloride: 104 mmol/L (ref 98–111)
GFR calc Af Amer: >60 mL/min (ref >60)
GFR calc non Af Amer: >60 mL/min (ref >60)
GLUCOSE: 97 mg/dL (ref 70–99)
Potassium: 4.1 mmol/L (ref 3.5–5.1)
SODIUM: 140 mmol/L (ref 135–145)
TOTAL PROTEIN: 6.9 g/dL (ref 6.5–8.1)

## 2018-06-21 LAB — URINALYSIS, COMPLETE (UACMP) WITH MICROSCOPIC
Bacteria, UA: NONE SEEN
Bilirubin Urine: NEGATIVE
GLUCOSE, UA: NEGATIVE mg/dL
Hgb urine dipstick: NEGATIVE
Ketones, ur: NEGATIVE mg/dL
Leukocytes, UA: NEGATIVE
NITRITE: NEGATIVE
Protein, ur: NEGATIVE mg/dL
Specific Gravity, Urine: 1.014 (ref 1.005–1.030)
pH: 6 (ref 5.0–8.0)

## 2018-06-21 LAB — CBC
HCT: 42 % (ref 35.0–47.0)
Hemoglobin: 14.7 g/dL (ref 12.0–16.0)
MCH: 31.7 pg (ref 26.0–34.0)
MCHC: 34.9 g/dL (ref 32.0–36.0)
MCV: 90.9 fL (ref 80.0–100.0)
PLATELETS: 463 10*3/uL — AB (ref 150–440)
RBC: 4.62 MIL/uL (ref 3.80–5.20)
RDW: 13.2 % (ref 11.5–14.5)
WBC: 11.4 10*3/uL — ABNORMAL HIGH (ref 3.6–11.0)

## 2018-06-21 LAB — LIPASE, BLOOD: LIPASE: 40 U/L (ref 11–51)

## 2018-06-21 LAB — POCT PREGNANCY, URINE: Preg Test, Ur: NEGATIVE

## 2018-06-21 MED ORDER — ONDANSETRON HCL 4 MG/2ML IJ SOLN: 4.0000 mg | Freq: Four times a day (QID) | INTRAMUSCULAR | Status: DC | PRN

## 2018-06-21 MED ORDER — PIPERACILLIN-TAZOBACTAM 3.375 G IVPB
3.3750 g | Freq: Three times a day (TID) | INTRAVENOUS | Status: DC
  Administered 2018-06-21 – 2018-06-24 (×7): 3.375 g via INTRAVENOUS
  Filled 2018-06-21 (×7): qty 50

## 2018-06-21 MED ORDER — ACETAMINOPHEN 500 MG PO TABS
1000.0000 mg | ORAL_TABLET | Freq: Four times a day (QID) | ORAL | Status: DC
  Administered 2018-06-21 – 2018-06-23 (×4): 1000 mg via ORAL
  Filled 2018-06-21 (×5): qty 2

## 2018-06-21 MED ORDER — MORPHINE SULFATE (PF) 2 MG/ML IV SOLN: 2.0000 mg | INTRAVENOUS | Status: DC | PRN

## 2018-06-21 MED ORDER — PROCHLORPERAZINE EDISYLATE 10 MG/2ML IJ SOLN: 5.0000 mg | Freq: Four times a day (QID) | INTRAMUSCULAR | Status: DC | PRN

## 2018-06-21 MED ORDER — SODIUM CHLORIDE 0.9 % IV BOLUS
1000.0000 mL | Freq: Once | INTRAVENOUS | Status: AC
  Administered 2018-06-21: 1000 mL via INTRAVENOUS

## 2018-06-21 MED ORDER — HYDROXYZINE HCL 25 MG PO TABS
25.0000 mg | ORAL_TABLET | Freq: Three times a day (TID) | ORAL | Status: DC | PRN
Start: 2018-06-21 — End: 2018-06-24

## 2018-06-21 MED ORDER — PROCHLORPERAZINE MALEATE 10 MG PO TABS: 10.0000 mg | ORAL_TABLET | Freq: Four times a day (QID) | ORAL | Status: DC | PRN

## 2018-06-21 MED ORDER — OXYCODONE HCL 5 MG PO TABS
5.0000 mg | ORAL_TABLET | ORAL | Status: DC | PRN
  Administered 2018-06-24: 10 mg via ORAL
  Filled 2018-06-21: qty 2

## 2018-06-21 MED ORDER — ONDANSETRON 4 MG PO TBDP
4.0000 mg | ORAL_TABLET | Freq: Once | ORAL | Status: AC | PRN
  Administered 2018-06-21: 4 mg via ORAL
  Filled 2018-06-21: qty 1

## 2018-06-21 MED ORDER — NICOTINE 21 MG/24HR TD PT24
21.0000 mg | MEDICATED_PATCH | Freq: Once | TRANSDERMAL | Status: AC
Start: 2018-06-21 — End: 2018-06-22
  Administered 2018-06-21: 21 mg via TRANSDERMAL
  Filled 2018-06-21: qty 1

## 2018-06-21 MED ORDER — FAMOTIDINE IN NACL 20-0.9 MG/50ML-% IV SOLN
20.0000 mg | Freq: Two times a day (BID) | INTRAVENOUS | Status: DC
  Administered 2018-06-21 – 2018-06-22 (×3): 20 mg via INTRAVENOUS
  Filled 2018-06-21 (×3): qty 50

## 2018-06-21 MED ORDER — ENOXAPARIN SODIUM 40 MG/0.4ML ~~LOC~~ SOLN
40.0000 mg | SUBCUTANEOUS | Status: DC
Start: 2018-06-21 — End: 2018-06-24
  Administered 2018-06-21 – 2018-06-22 (×2): 40 mg via SUBCUTANEOUS
  Filled 2018-06-21 (×2): qty 0.4

## 2018-06-21 MED ORDER — METFORMIN HCL 500 MG PO TABS
500.0000 mg | ORAL_TABLET | Freq: Two times a day (BID) | ORAL | Status: DC
  Administered 2018-06-22: 500 mg via ORAL
  Filled 2018-06-21 (×3): qty 1

## 2018-06-21 MED ORDER — KETOROLAC TROMETHAMINE 30 MG/ML IJ SOLN
30.0000 mg | Freq: Four times a day (QID) | INTRAMUSCULAR | Status: DC | PRN
Start: 2018-06-21 — End: 2018-06-24
  Administered 2018-06-22 – 2018-06-23 (×3): 30 mg via INTRAVENOUS
  Filled 2018-06-21 (×3): qty 1

## 2018-06-21 MED ORDER — KETOROLAC TROMETHAMINE 30 MG/ML IJ SOLN
30.0000 mg | Freq: Once | INTRAMUSCULAR | Status: AC
  Administered 2018-06-21: 30 mg via INTRAVENOUS
  Filled 2018-06-21: qty 1

## 2018-06-21 MED ORDER — LISINOPRIL 10 MG PO TABS
10.0000 mg | ORAL_TABLET | Freq: Every day | ORAL | Status: DC
  Administered 2018-06-21 – 2018-06-23 (×3): 10 mg via ORAL
  Filled 2018-06-21 (×3): qty 1

## 2018-06-21 MED ORDER — LACTATED RINGERS IV SOLN
INTRAVENOUS | Status: DC
  Administered 2018-06-21 – 2018-06-23 (×3): via INTRAVENOUS

## 2018-06-21 MED ORDER — PIPERACILLIN-TAZOBACTAM 3.375 G IVPB 30 MIN
3.3750 g | Freq: Once | INTRAVENOUS | Status: AC
  Administered 2018-06-21: 3.375 g via INTRAVENOUS
  Filled 2018-06-21 (×2): qty 50

## 2018-06-21 MED ORDER — IOHEXOL 300 MG/ML  SOLN
100.0000 mL | Freq: Once | INTRAMUSCULAR | Status: AC | PRN
  Administered 2018-06-21: 100 mL via INTRAVENOUS
  Filled 2018-06-21: qty 100

## 2018-06-21 MED ORDER — SUCRALFATE 1 G PO TABS
1.0000 g | ORAL_TABLET | Freq: Three times a day (TID) | ORAL | Status: DC
  Administered 2018-06-22 – 2018-06-23 (×8): 1 g via ORAL
  Filled 2018-06-21 (×8): qty 1

## 2018-06-21 MED ORDER — LINACLOTIDE 145 MCG PO CAPS
145.0000 ug | ORAL_CAPSULE | Freq: Every day | ORAL | Status: DC
  Administered 2018-06-22 – 2018-06-23 (×2): 145 ug via ORAL
  Filled 2018-06-21 (×3): qty 1

## 2018-06-21 MED ORDER — VENLAFAXINE HCL ER 75 MG PO CP24
75.0000 mg | ORAL_CAPSULE | Freq: Every day | ORAL | Status: DC
  Administered 2018-06-22 – 2018-06-23 (×2): 75 mg via ORAL
  Filled 2018-06-21 (×2): qty 1

## 2018-06-21 MED ORDER — ONDANSETRON 4 MG PO TBDP: 4.0000 mg | ORAL_TABLET | Freq: Four times a day (QID) | ORAL | Status: DC | PRN

## 2018-06-21 MED ORDER — CLONAZEPAM 0.5 MG PO TABS
0.5000 mg | ORAL_TABLET | Freq: Three times a day (TID) | ORAL | Status: DC | PRN
Start: 2018-06-21 — End: 2018-06-24
  Administered 2018-06-21 – 2018-06-24 (×5): 0.5 mg via ORAL
  Filled 2018-06-21 (×5): qty 1

## 2018-06-21 NOTE — ED Notes (Signed)
General Surgery to bedside at this time.   

## 2018-06-21 NOTE — H&P (Signed)
Patient ID: Courtney King, female   DOB: 09-13-1966, 52 y.o.   MRN: [id number]  HPI [name] is a 52 y.o. female to see in consultation by Dr. Jimmye Norman for complicated diverticulitis.  She came in with left lower quadrant suprapubic pain that is present for the last 2 weeks but worsening for the last day.  Patient reports the pain is intermittent moderate intensity and sharp in nature.  Patient also noticed some pneumaturia over the last 3 weeks or so.  Of note the patient had a history of a prior Nissen fundoplication a few months ago some IBS and a history of apparently aortic endarterectomy at [facility] several years ago. Is able to perform more than 4 METS of activity without any shortness of breath or chest pain.  She did have a recent colonoscopy 2 months ago also showed no evidence of cancer. CT scan personal review showing evidence of a small diverticular abscess at close to the bladder.  No evidence of free air no evidence of perforation.  There is some evidence of diverticulitis as well. White count is 11.4 and she does have some thrombocytosis as well.  Creatinine is normal. HPI  Past Medical History:  Diagnosis Date  . Allergy   . Anxiety   . Diabetes mellitus without complication ([place])   . GERD (gastroesophageal reflux disease)   . H/O blood clots 2014   in abdominal aorta - endarterectomy at [facility]  . Hyperlipidemia   . Hypertension   . Left leg claudication [facility])     Past Surgical History:  Procedure Laterality Date  . ABDOMINAL HYSTERECTOMY  2004  . AORTIC ENDARTERECETOMY  2013  . CHOLECYSTECTOMY  1999  . COLONOSCOPY WITH PROPOFOL N/A 04/03/2017   Procedure: COLONOSCOPY WITH PROPOFOL;  Surgeon: Jonathon Bellows, MD;  Location: [facility] ENDOSCOPY;  Service: Endoscopy;  Laterality: N/A;  . ESOPHAGOGASTRODUODENOSCOPY (EGD) WITH PROPOFOL N/A 04/03/2017   Procedure: ESOPHAGOGASTRODUODENOSCOPY (EGD) WITH PROPOFOL;  Surgeon: Jonathon Bellows, MD;  Location: [facility] ENDOSCOPY;  Service: Endoscopy;   Laterality: N/A;  . EUS N/A 04/30/2017   Procedure: FULL UPPER ENDOSCOPIC ULTRASOUND (EUS) RADIAL;  Surgeon: Jola Schmidt, MD;  Location: [facility] ENDOSCOPY;  Service: Endoscopy;  Laterality: N/A;  . FOOT SURGERY Left    plantar fasciitis  . OOPHORECTOMY Bilateral 2004    Family History  Problem Relation Age of Onset  . Cancer Mother        ovarian  . Cancer Maternal Aunt        ovarian    Social History Social History   Tobacco Use  . Smoking status: Current Every Day Smoker    Packs/day: 1.00    Years: 20.00    Pack years: 20.00  . Smokeless tobacco: Current User  Substance Use Topics  . Alcohol use: No    Alcohol/week: 0.0 oz  . Drug use: No    Allergies  Allergen Reactions  . Codeine Other (See Comments), Nausea And Vomiting and Nausea Only    Other Reaction: Not Assessed Other Reaction: Not Assessed Other Reaction: Not Assessed Other Reaction: Not Assessed    Current Facility-Administered Medications  Medication Dose Route Frequency Provider Last Rate Last Dose  . nicotine (NICODERM CQ - dosed in mg/24 hours) patch 21 mg  21 mg Transdermal Once Earleen Newport, MD   21 mg at 06/21/18 1737   Current Outpatient Medications  Medication Sig Dispense Refill  . aspirin EC 81 MG tablet Take 81 mg by mouth at bedtime.    Marland Kitchen  clonazePAM (KLONOPIN) 0.5 MG tablet Take 0.5 mg by mouth at bedtime as needed.     . hydrOXYzine (ATARAX/VISTARIL) 25 MG tablet Take 25 mg by mouth 3 (three) times daily as needed.    . linaclotide (LINZESS) 145 MCG CAPS capsule Take 1 capsule (145 mcg total) by mouth daily before breakfast. 90 capsule 0  . lisinopril (PRINIVIL,ZESTRIL) 20 MG tablet Take 0.5 tablets (10 mg total) by mouth at bedtime. 90 tablet 3  . venlafaxine XR (EFFEXOR-XR) 75 MG 24 hr capsule Take 1 capsule (75 mg total) by mouth daily with breakfast. 90 capsule 3  . dexlansoprazole (DEXILANT) 60 MG capsule Take 1 capsule (60 mg total) by mouth daily. (Patient not taking:  Reported on 06/21/2018) 90 capsule 3  . metFORMIN (GLUCOPHAGE) 500 MG tablet Take 1 tablet (500 mg total) by mouth 2 (two) times daily with a meal. (Patient not taking: Reported on 06/21/2018) 180 tablet 3  . sucralfate (CARAFATE) 1 g tablet Take 1 tablet (1 g total) by mouth 4 (four) times daily -  with meals and at bedtime. (Patient not taking: Reported on 06/21/2018) 30 tablet 3     Review of Systems Full ROS  was asked and was negative except for the information on the HPI  Physical Exam Blood pressure (!) 148/65, pulse 80, temperature 98.7 F (37.1 C), temperature source Oral, resp. rate 16, height 5\' 2"  (1.575 m), weight 165 lb (74.8 kg), SpO2 100 %. CONSTITUTIONAL: NAD EYES: Pupils are equal, round, and reactive to light, Sclera are non-icteric. EARS, NOSE, MOUTH AND THROAT: The oropharynx is clear. The oral mucosa is pink and moist. Hearing is intact to voice. LYMPH NODES:  Lymph nodes in the neck are normal. RESPIRATORY:  Lungs are clear. There is normal respiratory effort, with equal breath sounds bilaterally, and without pathologic use of accessory muscles. CARDIOVASCULAR: Heart is regular without murmurs, gallops, or rubs. GI: The abdomen is  soft, Mild TTP suprapubic area, no peritonitis. There are no palpable masses. There is no hepatosplenomegaly. There are normal bowel sounds in all quadrants. GU: Rectal deferred.   MUSCULOSKELETAL: Normal muscle strength and tone. No cyanosis or edema.   SKIN: Turgor is good and there are no pathologic skin lesions or ulcers. NEUROLOGIC: Motor and sensation is grossly normal. Cranial nerves are grossly intact. PSYCH:  Oriented to person, place and time. Affect is normal.  Data Reviewed  I have personally reviewed the patient's imaging, laboratory findings and medical records.    Assessment/Plan 52 year old female with complicated diverticulitis and diverticular abscess as well as a colovesical fistula. Currently patient is not toxic and has   no peritonitis.  We will admit for IV antibiotics IV fluids and serial abdominal exams.  I do think that he she will benefit from elective sigmoid colectomy in the near future.  Discussed with the patient detail that if she may not improve with medical management may need an urgent Hartman's procedure.  At this time there is no need for any emergent surgical intervention.  Caroleen Hamman, MD FACS General Surgeon 06/21/2018, 7:42 PM

## 2018-06-21 NOTE — ED Notes (Signed)
Richard, pharmacy tech, at bedside to review pt medications.

## 2018-06-21 NOTE — ED Notes (Signed)
Patient transported to CT 

## 2018-06-21 NOTE — ED Provider Notes (Signed)
[facility] Emergency Department Provider Note       Time seen: ----------------------------------------- 4:04 PM on 06/21/2018 -----------------------------------------   I have reviewed the triage vital signs and the nursing notes.  HISTORY   Chief Complaint Abdominal Pain and Constipation    HPI Courtney King is a 52 y.o. female with a history of anxiety, diabetes, GERD, hyperlipidemia, hypertension and claudication who presents to the ED for left lower quadrant pain is been worsening over the past 2 weeks.  Patient report having a last normal bowel movement last week.  She is using enemas and suppositories without any improvement.  She reports nausea but no vomiting.  She does take Linzess for IBS associated symptoms without any improvement.  Past Medical History:  Diagnosis Date  . Allergy   . Anxiety   . Diabetes mellitus without complication ([place])   . GERD (gastroesophageal reflux disease)   . H/O blood clots 2014   in abdominal aorta - endarterectomy at [facility]  . Hyperlipidemia   . Hypertension   . Left leg claudication [facility])     Patient Active Problem List   Diagnosis Date Noted  . Anxiety, generalized 10/20/2016  . H/O carotid endarterectomy 10/20/2016  . Hyperlipidemia, mixed 10/20/2016  . Pruritic erythematous rash 10/20/2016  . Dependence on nicotine from other tobacco product 07/11/2016  . Family history of colon cancer 12/07/2015  . Family history of ovarian cancer 12/07/2015  . Prediabetes 12/04/2015  . Hx of endarterectomy 11/02/2015  . Essential hypertension 11/02/2015  . Hypertriglyceridemia 09/03/2015  . Adiposity 09/03/2015  . BMI 29.0-29.9,adult 07/17/2015  . Anxiety 07/17/2015  . GERD (gastroesophageal reflux disease) 07/17/2015  . Hot flashes, menopausal 07/17/2015    Past Surgical History:  Procedure Laterality Date  . ABDOMINAL HYSTERECTOMY  2004  . AORTIC ENDARTERECETOMY  2013  . CHOLECYSTECTOMY  1999  .  COLONOSCOPY WITH PROPOFOL N/A 04/03/2017   Procedure: COLONOSCOPY WITH PROPOFOL;  Surgeon: Jonathon Bellows, MD;  Location: [facility] ENDOSCOPY;  Service: Endoscopy;  Laterality: N/A;  . ESOPHAGOGASTRODUODENOSCOPY (EGD) WITH PROPOFOL N/A 04/03/2017   Procedure: ESOPHAGOGASTRODUODENOSCOPY (EGD) WITH PROPOFOL;  Surgeon: Jonathon Bellows, MD;  Location: [facility] ENDOSCOPY;  Service: Endoscopy;  Laterality: N/A;  . EUS N/A 04/30/2017   Procedure: FULL UPPER ENDOSCOPIC ULTRASOUND (EUS) RADIAL;  Surgeon: Jola Schmidt, MD;  Location: [facility] ENDOSCOPY;  Service: Endoscopy;  Laterality: N/A;  . FOOT SURGERY Left    plantar fasciitis  . OOPHORECTOMY Bilateral 2004    Allergies Codeine  Social History Social History   Tobacco Use  . Smoking status: Current Every Day Smoker    Packs/day: 1.00    Years: 20.00    Pack years: 20.00  . Smokeless tobacco: Current User  Substance Use Topics  . Alcohol use: No    Alcohol/week: 0.0 oz  . Drug use: No   Review of Systems Constitutional: Negative for fever. Cardiovascular: Negative for chest pain. Respiratory: Negative for shortness of breath. Gastrointestinal: Positive for abdominal pain, constipation Genitourinary: Negative for dysuria. Musculoskeletal: Negative for back pain. Skin: Negative for rash. Neurological: Negative for headaches, focal weakness or numbness.  All systems negative/normal/unremarkable except as stated in the HPI  ____________________________________________   PHYSICAL EXAM:  VITAL SIGNS: ED Triage Vitals  Enc Vitals Group     BP 06/21/18 1426 133/68     Pulse Rate 06/21/18 1426 100     Resp 06/21/18 1426 18     Temp 06/21/18 1426 98.7 F (37.1 C)     Temp Source  06/21/18 1426 Oral     SpO2 06/21/18 1426 98 %     Weight 06/21/18 1427 165 lb (74.8 kg)     Height 06/21/18 1427 5\' 2"  (1.575 m)     Head Circumference --      Peak Flow --      Pain Score 06/21/18 1427 5     Pain Loc --      Pain Edu? --      Excl. in [place]? --     Constitutional: Alert and oriented. Well appearing and in no distress. Eyes: Conjunctivae are normal. Normal extraocular movements. ENT   Head: Normocephalic and atraumatic.   Nose: No congestion/rhinnorhea.   Mouth/Throat: Mucous membranes are moist.   Neck: No stridor. Cardiovascular: Normal rate, regular rhythm. No murmurs, rubs, or gallops. Respiratory: Normal respiratory effort without tachypnea nor retractions. Breath sounds are clear and equal bilaterally. No wheezes/rales/rhonchi. Gastrointestinal: Lower quadrant tenderness, no rebound or guarding.  Normal bowel sounds. Musculoskeletal: Nontender with normal range of motion in extremities. No lower extremity tenderness nor edema. Neurologic:  Normal speech and language. No gross focal neurologic deficits are appreciated.  Skin:  Skin is warm, dry and intact. No rash noted. Psychiatric: Mood and affect are normal. Speech and behavior are normal.  ____________________________________________  ED COURSE:  As part of my medical decision making, I reviewed the following data within the [place] History obtained from family if available, nursing notes, old chart and ekg, as well as notes from prior ED visits. Patient presented for abdominal pain, we will assess with labs and imaging as indicated at this time.   Procedures ____________________________________________   LABS (pertinent positives/negatives)  Labs Reviewed  CBC - Abnormal; Notable for the following components:      Result Value   WBC 11.4 (*)    Platelets 463 (*)    All other components within normal limits  URINALYSIS, COMPLETE (UACMP) WITH MICROSCOPIC - Abnormal; Notable for the following components:   Color, Urine YELLOW (*)    APPearance CLEAR (*)    All other components within normal limits  LIPASE, BLOOD  COMPREHENSIVE METABOLIC PANEL  POCT PREGNANCY, URINE    RADIOLOGY Images were viewed by me  CT of abdomen pelvis  with contrast IMPRESSION: 1. Sigmoid diverticulitis complicated by adjacent 1.9 cm abscess. 2. The abscess is adjacent to the vaginal cough. Gas is present in the vagina. This may represent a colorectal fistula. 3. Additional diverticular disease in the descending colon without inflammation. 4. Punctate nonobstructive stone at the lower pole of the left kidney. 5. Hepatic steatosis. 6.  Aortic Atherosclerosis (ICD10-I70.0).  These results were called by telephone at the time of interpretation on 06/21/2018 at 5:21 pm to Dr. Lenise Arena , who verbally acknowledged these results. ____________________________________________  DIFFERENTIAL DIAGNOSIS   Diverticulitis, constipation, renal colic, UTI  FINAL ASSESSMENT AND PLAN  Sigmoid diverticulitis, diverticular abscess   Plan: The patient had presented for worsening left lower quadrant pain and constipation. Patient's labs revealed mild leukocytosis but were otherwise normal. Patient's imaging did reveal diverticulitis with an adjacent abscess and possible development of a fistula.  I will start her on IV Zosyn and discussed with general surgery for admission.   Laurence Aly, MD   Note: This note was generated in part or whole with voice recognition software. Voice recognition is usually quite accurate but there are transcription errors that can and very often do occur. I apologize for any typographical errors that were not  detected and corrected.     Earleen Newport, MD 06/21/18 1726

## 2018-06-21 NOTE — ED Notes (Signed)
ED Provider at bedside. 

## 2018-06-21 NOTE — ED Triage Notes (Signed)
Patient reports LLQ abdominal pain worsening over the last 2 weeks. Reports last normal bowel last week. Has used fleets enemas and suppositories at home with no improvement. Patient reports nausea but no vomiting.

## 2018-06-22 LAB — CBC
HEMATOCRIT: 36.9 % (ref 35.0–47.0)
Hemoglobin: 12.8 g/dL (ref 12.0–16.0)
MCH: 31.9 pg (ref 26.0–34.0)
MCHC: 34.8 g/dL (ref 32.0–36.0)
MCV: 91.5 fL (ref 80.0–100.0)
Platelets: 369 10*3/uL (ref 150–440)
RBC: 4.03 MIL/uL (ref 3.80–5.20)
RDW: 13.4 % (ref 11.5–14.5)
WBC: 7.6 10*3/uL (ref 3.6–11.0)

## 2018-06-22 LAB — BASIC METABOLIC PANEL
Anion gap: 7 (ref 5–15)
BUN: 8 mg/dL (ref 6–20)
CHLORIDE: 110 mmol/L (ref 98–111)
CO2: 26 mmol/L (ref 22–32)
CREATININE: 0.8 mg/dL (ref 0.44–1.00)
Calcium: 8.3 mg/dL — ABNORMAL LOW (ref 8.9–10.3)
GFR calc non Af Amer: >60 mL/min (ref >60)
Glucose, Bld: 94 mg/dL (ref 70–99)
Potassium: 3.7 mmol/L (ref 3.5–5.1)
Sodium: 143 mmol/L (ref 135–145)

## 2018-06-22 LAB — MAGNESIUM: Magnesium: 1.8 mg/dL (ref 1.7–2.4)

## 2018-06-22 NOTE — Progress Notes (Signed)
CC: Diverticulitis Subjective: Pain a little bit better but still present, taking clears. No N/V WBC normalized   Objective: Vital signs in last 24 hours: Temp:  [98.4 F (36.9 C)-98.8 F (37.1 C)] 98.8 F (37.1 C) (08/06 0517) Pulse Rate:  [66-100] 68 (08/06 0517) Resp:  [16-18] 16 (08/06 0517) BP: (113-148)/(58-74) 113/58 (08/06 0517) SpO2:  [93 %-100 %] 98 % (08/06 0517) Weight:  [165 lb (74.8 kg)-168 lb 3.4 oz (76.3 kg)] 168 lb 3.4 oz (76.3 kg) (08/05 2209) Last BM Date: 06/14/18(regularly irregular)  Intake/Output from previous day: 08/05 0701 - 08/06 0700 In: 2019 [I.V.:825; IV Piggyback:1194] Out: 100 [Urine:100] Intake/Output this shift: No intake/output data recorded.  Physical exam:  NAD, awake and alert Abd: soft, mild TTP LLQ, no peritonitis Ext: no edema and well perfused  Lab Results: CBC  Recent Labs    06/21/18 1429 06/22/18 0443  WBC 11.4* 7.6  HGB 14.7 12.8  HCT 42.0 36.9  PLT 463* 369   BMET Recent Labs    06/21/18 1429 06/22/18 0443  NA 140 143  K 4.1 3.7  CL 104 110  CO2 26 26  GLUCOSE 97 94  BUN 11 8  CREATININE 0.93 0.80  CALCIUM 9.1 8.3*   PT/INR No results for input(s): LABPROT, INR in the last 72 hours. ABG No results for input(s): PHART, HCO3 in the last 72 hours.  Invalid input(s): PCO2, PO2  Studies/Results: Ct Abdomen Pelvis W Contrast  Result Date: 06/21/2018 CLINICAL DATA:  Progressive left lower quadrant abdominal pain EXAM: CT ABDOMEN AND PELVIS WITH CONTRAST TECHNIQUE: Multidetector CT imaging of the abdomen and pelvis was performed using the standard protocol following bolus administration of intravenous contrast. CONTRAST:  11mL OMNIPAQUE IOHEXOL 300 MG/ML  SOLN COMPARISON:  CT abdomen pelvis 02/23/2017. FINDINGS: Lower chest: Lung bases are clear without focal nodule, mass, or airspace disease. The heart size is normal. No significant pleural or pericardial effusion is present. Hepatobiliary: There is diffuse  fatty infiltration of the liver. There is some sparing laterally in the right lobe. No mass lesion is present. No significant intrahepatic biliary dilation is present. Cholecystectomy is noted. Common bile duct is within normal limits following cholecystectomy. Pancreas: Unremarkable. No pancreatic ductal dilatation or surrounding inflammatory changes. Spleen: Normal in size without focal abnormality. Adrenals/Urinary Tract: The adrenal glands are normal bilaterally. A punctate non-obstructing stone near the lower pole of the left kidney is stable. No obstructing disease is present. The urinary bladder is within normal limits. Stomach/Bowel: Fundal location is noted at the GE junction. There is mild dilation of the distal esophagus. Stomach is otherwise within normal limits. Duodenal ulcer is present without focal inflammation. Small bowel is within normal limits. Terminal ileum is normal. The appendix is visualized and normal. Ascending transverse colon are normal. Diverticular changes are present in the descending colon. Diffuse inflammatory changes are present within the sigmoid colon compatible with acute diverticulitis. The fluid collection adjacent to the inflamed area measures 1.8 x 1.9 x 1.9 cm. This extends to the vaginal cuff. Of note, there is gas within vagina. There is no gas within the fluid collection. Vascular/Lymphatic: Scattered atherosclerotic changes are present throughout the aorta without aneurysm. No significant adenopathy is present. Reproductive: Status post hysterectomy. No adnexal masses. Gas is noted within the vagina. Other: 10 mm supraumbilical ventral hernia contains fat without bowel. No other significant hernia is present. No significant free fluid is present. Musculoskeletal: Vertebral body heights alignment are maintained. Pelvis is within normal limits. Hips are  located and normal. IMPRESSION: 1. Sigmoid diverticulitis complicated by adjacent 1.9 cm abscess. 2. The abscess is  adjacent to the vaginal cough. Gas is present in the vagina. This may represent a colorectal fistula. 3. Additional diverticular disease in the descending colon without inflammation. 4. Punctate nonobstructive stone at the lower pole of the left kidney. 5. Hepatic steatosis. 6.  Aortic Atherosclerosis (ICD10-I70.0). These results were called by telephone at the time of interpretation on 06/21/2018 at 5:21 pm to Dr. Lenise Arena , who verbally acknowledged these results. Electronically Signed   By: San Morelle M.D.   On: 06/21/2018 17:21    Anti-infectives: Anti-infectives (From admission, onward)   Start     Dose/Rate Route Frequency Ordered Stop   06/22/18 0000  piperacillin-tazobactam (ZOSYN) IVPB 3.375 g     3.375 g 12.5 mL/hr over 240 Minutes Intravenous Every 8 hours 06/21/18 2208     06/21/18 1800  piperacillin-tazobactam (ZOSYN) IVPB 3.375 g     3.375 g 100 mL/hr over 30 Minutes Intravenous  Once 06/21/18 1720 06/21/18 1800      Assessment/Plan: Complicated diverticular abscess Clears today continue a/bs No surgical intervention Caroleen Hamman, MD, FACS  06/22/2018

## 2018-06-23 ENCOUNTER — Telehealth: Payer: Self-pay

## 2018-06-23 LAB — HIV ANTIBODY (ROUTINE TESTING W REFLEX): HIV Screen 4th Generation wRfx: NONREACTIVE

## 2018-06-23 MED ORDER — FAMOTIDINE 20 MG PO TABS
20.0000 mg | ORAL_TABLET | Freq: Two times a day (BID) | ORAL | Status: DC
  Administered 2018-06-23 (×2): 20 mg via ORAL
  Filled 2018-06-23 (×2): qty 1

## 2018-06-23 NOTE — Telephone Encounter (Signed)
Disability paperwork faxed to [employer] at this time. Copy made and placed in scan folder. Patient currently admitted.

## 2018-06-23 NOTE — Progress Notes (Signed)
PHARMACIST - PHYSICIAN COMMUNICATION  CONCERNING: IV to Oral Route Change Policy  RECOMMENDATION: This patient is receiving famotidine by the intravenous route.  Based on criteria approved by the Pharmacy and Therapeutics Committee, the intravenous medication(s) is/are being converted to the equivalent oral dose form(s).   DESCRIPTION: These criteria include:  The patient is eating (either orally or via tube) and/or has been taking other orally administered medications for a least 24 hours  The patient has no evidence of active gastrointestinal bleeding or impaired GI absorption (gastrectomy, short bowel, patient on TNA or NPO).  If you have questions about this conversion, please contact the Pharmacy Department  []   [phone] )  [facility] [x]   [phone] )  [facility] []   [phone] )  [facility] []   [phone] )  [facility] []   [phone] )  [place], [facility] 06/23/2018 10:35 AM

## 2018-06-23 NOTE — Progress Notes (Signed)
CC: Diverticulitis Subjective: Feeling better, taking clears, no N/V AVSS  Objective: Vital signs in last 24 hours: Temp:  [97.6 F (36.4 C)-98.4 F (36.9 C)] 97.6 F (36.4 C) (08/07 0517) Pulse Rate:  [59-93] 63 (08/07 0517) Resp:  [16-20] 19 (08/07 0517) BP: (96-160)/(46-84) 129/63 (08/07 0517) SpO2:  [95 %-100 %] 95 % (08/07 0517) Last BM Date: 06/23/18  Intake/Output from previous day: 08/06 0701 - 08/07 0700 In: 50 [IV Piggyback:50] Out: 0  Intake/Output this shift: No intake/output data recorded.  Physical exam:  NAD, awake and alert Abd: soft, very mild TTP LLQ, no peritonitis. + BS Ext: no edema and well perfused  Lab Results: CBC  Recent Labs    06/21/18 1429 06/22/18 0443  WBC 11.4* 7.6  HGB 14.7 12.8  HCT 42.0 36.9  PLT 463* 369   BMET Recent Labs    06/21/18 1429 06/22/18 0443  NA 140 143  K 4.1 3.7  CL 104 110  CO2 26 26  GLUCOSE 97 94  BUN 11 8  CREATININE 0.93 0.80  CALCIUM 9.1 8.3*   PT/INR No results for input(s): LABPROT, INR in the last 72 hours. ABG No results for input(s): PHART, HCO3 in the last 72 hours.  Invalid input(s): PCO2, PO2  Studies/Results: Ct Abdomen Pelvis W Contrast  Result Date: 06/21/2018 CLINICAL DATA:  Progressive left lower quadrant abdominal pain EXAM: CT ABDOMEN AND PELVIS WITH CONTRAST TECHNIQUE: Multidetector CT imaging of the abdomen and pelvis was performed using the standard protocol following bolus administration of intravenous contrast. CONTRAST:  158mL OMNIPAQUE IOHEXOL 300 MG/ML  SOLN COMPARISON:  CT abdomen pelvis 02/23/2017. FINDINGS: Lower chest: Lung bases are clear without focal nodule, mass, or airspace disease. The heart size is normal. No significant pleural or pericardial effusion is present. Hepatobiliary: There is diffuse fatty infiltration of the liver. There is some sparing laterally in the right lobe. No mass lesion is present. No significant intrahepatic biliary dilation is present.  Cholecystectomy is noted. Common bile duct is within normal limits following cholecystectomy. Pancreas: Unremarkable. No pancreatic ductal dilatation or surrounding inflammatory changes. Spleen: Normal in size without focal abnormality. Adrenals/Urinary Tract: The adrenal glands are normal bilaterally. A punctate non-obstructing stone near the lower pole of the left kidney is stable. No obstructing disease is present. The urinary bladder is within normal limits. Stomach/Bowel: Fundal location is noted at the GE junction. There is mild dilation of the distal esophagus. Stomach is otherwise within normal limits. Duodenal ulcer is present without focal inflammation. Small bowel is within normal limits. Terminal ileum is normal. The appendix is visualized and normal. Ascending transverse colon are normal. Diverticular changes are present in the descending colon. Diffuse inflammatory changes are present within the sigmoid colon compatible with acute diverticulitis. The fluid collection adjacent to the inflamed area measures 1.8 x 1.9 x 1.9 cm. This extends to the vaginal cuff. Of note, there is gas within vagina. There is no gas within the fluid collection. Vascular/Lymphatic: Scattered atherosclerotic changes are present throughout the aorta without aneurysm. No significant adenopathy is present. Reproductive: Status post hysterectomy. No adnexal masses. Gas is noted within the vagina. Other: 10 mm supraumbilical ventral hernia contains fat without bowel. No other significant hernia is present. No significant free fluid is present. Musculoskeletal: Vertebral body heights alignment are maintained. Pelvis is within normal limits. Hips are located and normal. IMPRESSION: 1. Sigmoid diverticulitis complicated by adjacent 1.9 cm abscess. 2. The abscess is adjacent to the vaginal cough. Gas is present in  the vagina. This may represent a colorectal fistula. 3. 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 4. Punctate nonobstructive stone at the lower pole of the left kidney. 5. Hepatic steatosis. 6.  Aortic Atherosclerosis (ICD10-I70.0). These results were called by telephone at the time of interpretation on 06/21/2018 at 5:21 pm to Dr. Lenise Arena , who verbally acknowledged these results. Electronically Signed   By: San Morelle M.D.   On: 06/21/2018 17:21    Anti-infectives: Anti-infectives (From admission, onward)   Start     Dose/Rate Route Frequency Ordered Stop   06/22/18 0000  piperacillin-tazobactam (ZOSYN) IVPB 3.375 g     3.375 g 12.5 mL/hr over 240 Minutes Intravenous Every 8 hours 06/21/18 2208     06/21/18 1800  piperacillin-tazobactam (ZOSYN) IVPB 3.375 g     3.375 g 100 mL/hr over 30 Minutes Intravenous  Once 06/21/18 1720 06/21/18 1800      Assessment/Plan:  Diverticulitis w abscess and fistula Responding to A/bs Full liquids and then low residue heplock ivf No surgical intervention DC in am  Caroleen Hamman, MD, FACS  06/23/2018

## 2018-06-24 MED ORDER — METRONIDAZOLE 500 MG PO TABS: 500.0000 mg | ORAL_TABLET | Freq: Three times a day (TID) | ORAL | 0 refills | Status: AC

## 2018-06-24 MED ORDER — CIPROFLOXACIN HCL 500 MG PO TABS: 500.0000 mg | ORAL_TABLET | Freq: Two times a day (BID) | ORAL | 0 refills | Status: AC

## 2018-06-24 NOTE — Discharge Summary (Signed)
Patient ID: Courtney King MRN: [id number] DOB/AGE: Feb 16, 1966 52 y.o.  Admit date: 06/21/2018 Discharge date: 06/24/2018   Discharge Diagnoses:  Active Problems:   Diverticulitis of colon  Hospital Course: 52 yo female evaluated for left lower quadrant pain pneumaturia.  CT scan of the small abscess with diverticulitis.  Patient reports.  She was admitted for IV antibiotic and bowel rest.  She did very well.  Her diet was advanced form clear liquid to low residue. At the time of DC she was ambulating, tolerating diet, passing  Flatus. Her  Vitals  And her white count were normal.  Her physical exam showed a female in NAD, awake and alert .GCS 15. Abdomen: Soft, nontender no peritonitis.  Extremities well-perfused. Condition of the  patient the patient at Discharge is stable. We discussed my recommendation for elective sigmoid colectomy as an outpatient.    Disposition: Discharge disposition: 01-Home or Self Care       Discharge Instructions    Call MD for:  difficulty breathing, headache or visual disturbances   Complete by:  As directed    Call MD for:  extreme fatigue   Complete by:  As directed    Call MD for:  hives   Complete by:  As directed    Call MD for:  persistant dizziness or light-headedness   Complete by:  As directed    Call MD for:  persistant nausea and vomiting   Complete by:  As directed    Call MD for:  redness, tenderness, or signs of infection (pain, swelling, redness, odor or green/yellow discharge around incision site)   Complete by:  As directed    Call MD for:  severe uncontrolled pain   Complete by:  As directed    Call MD for:  temperature >100.4   Complete by:  As directed    Diet - low sodium heart healthy   Complete by:  As directed    Increase activity slowly   Complete by:  As directed      Allergies as of 06/24/2018      Reactions   Codeine Other (See Comments), Nausea And Vomiting, Nausea Only   Other Reaction: Not Assessed Other  Reaction: Not Assessed Other Reaction: Not Assessed Other Reaction: Not Assessed      Medication List    TAKE these medications   aspirin EC 81 MG tablet Take 81 mg by mouth at bedtime.   ciprofloxacin 500 MG tablet Commonly known as:  CIPRO Take 1 tablet (500 mg total) by mouth 2 (two) times daily for 10 days.   clonazePAM 0.5 MG tablet Commonly known as:  KLONOPIN Take 0.5 mg by mouth at bedtime as needed.   dexlansoprazole 60 MG capsule Commonly known as:  DEXILANT Take 1 capsule (60 mg total) by mouth daily.   hydrOXYzine 25 MG tablet Commonly known as:  ATARAX/VISTARIL Take 25 mg by mouth 3 (three) times daily as needed.   linaclotide 145 MCG Caps capsule Commonly known as:  LINZESS Take 1 capsule (145 mcg total) by mouth daily before breakfast.   lisinopril 20 MG tablet Commonly known as:  PRINIVIL,ZESTRIL Take 0.5 tablets (10 mg total) by mouth at bedtime.   metFORMIN 500 MG tablet Commonly known as:  GLUCOPHAGE Take 1 tablet (500 mg total) by mouth 2 (two) times daily with a meal.   metroNIDAZOLE 500 MG tablet Commonly known as:  FLAGYL Take 1 tablet (500 mg total) by mouth 3 (three) times daily for 10  days.   sucralfate 1 g tablet Commonly known as:  CARAFATE Take 1 tablet (1 g total) by mouth 4 (four) times daily -  with meals and at bedtime.   venlafaxine XR 75 MG 24 hr capsule Commonly known as:  EFFEXOR-XR Take 1 capsule (75 mg total) by mouth daily with breakfast.      Follow-up Information    Dr. Caroleen Hamman On 07/07/2018.   Why:  @9am  Contact information: [address] [place], [place] [address] [phone]           Caroleen Hamman, MD FACS

## 2018-06-24 NOTE — Progress Notes (Signed)
Courtney King to be D/C'd home per MD order.  Discussed prescriptions and follow up appointments with the patient. Prescriptions given to patient, medication list explained in detail. Pt verbalized understanding.  Allergies as of 06/24/2018      Reactions   Codeine Other (See Comments), Nausea And Vomiting, Nausea Only   Other Reaction: Not Assessed Other Reaction: Not Assessed Other Reaction: Not Assessed Other Reaction: Not Assessed      Medication List    TAKE these medications   aspirin EC 81 MG tablet Take 81 mg by mouth at bedtime.   ciprofloxacin 500 MG tablet Commonly known as:  CIPRO Take 1 tablet (500 mg total) by mouth 2 (two) times daily for 10 days.   clonazePAM 0.5 MG tablet Commonly known as:  KLONOPIN Take 0.5 mg by mouth at bedtime as needed.   dexlansoprazole 60 MG capsule Commonly known as:  DEXILANT Take 1 capsule (60 mg total) by mouth daily.   hydrOXYzine 25 MG tablet Commonly known as:  ATARAX/VISTARIL Take 25 mg by mouth 3 (three) times daily as needed.   linaclotide 145 MCG Caps capsule Commonly known as:  LINZESS Take 1 capsule (145 mcg total) by mouth daily before breakfast.   lisinopril 20 MG tablet Commonly known as:  PRINIVIL,ZESTRIL Take 0.5 tablets (10 mg total) by mouth at bedtime.   metFORMIN 500 MG tablet Commonly known as:  GLUCOPHAGE Take 1 tablet (500 mg total) by mouth 2 (two) times daily with a meal.   metroNIDAZOLE 500 MG tablet Commonly known as:  FLAGYL Take 1 tablet (500 mg total) by mouth 3 (three) times daily for 10 days.   sucralfate 1 g tablet Commonly known as:  CARAFATE Take 1 tablet (1 g total) by mouth 4 (four) times daily -  with meals and at bedtime.   venlafaxine XR 75 MG 24 hr capsule Commonly known as:  EFFEXOR-XR Take 1 capsule (75 mg total) by mouth daily with breakfast.       Vitals:   06/23/18 2031 06/24/18 0525  BP: (!) 144/78 116/64  Pulse: 97 60  Resp: 20 20  Temp: 98 F (36.7 C) 98.3 F  (36.8 C)  SpO2: 98% 98%    Skin clean, dry and intact without evidence of skin break down, no evidence of skin tears noted. IV catheter discontinued intact. Site without signs and symptoms of complications. Dressing and pressure applied. Pt denies pain at this time. No complaints noted.  An After Visit Summary was printed and given to the patient. Patient escorted via [place], and D/C home via private auto.  Elick Aguilera A Yago Ludvigsen

## 2018-06-28 ENCOUNTER — Ambulatory Visit (INDEPENDENT_AMBULATORY_CARE_PROVIDER_SITE_OTHER): Payer: Managed Care, Other (non HMO) | Admitting: Surgery

## 2018-06-28 ENCOUNTER — Telehealth: Payer: Self-pay | Admitting: Surgery

## 2018-06-28 ENCOUNTER — Encounter: Payer: Self-pay | Admitting: Surgery

## 2018-06-28 VITALS — BP 149/81 | HR 120 | Temp 97.8°F | Wt 165.0 lb

## 2018-06-28 DIAGNOSIS — K5732 Diverticulitis of large intestine without perforation or abscess without bleeding: Secondary | ICD-10-CM

## 2018-06-28 MED ORDER — SODIUM CHLORIDE 0.9 % IV SOLN
1.0000 g | INTRAVENOUS | Status: AC
  Administered 2018-06-29: 1 g via INTRAVENOUS
  Filled 2018-06-28: qty 1

## 2018-06-28 MED ORDER — NEOMYCIN SULFATE 500 MG PO TABS: 1000.0000 mg | ORAL_TABLET | Freq: Two times a day (BID) | ORAL | 0 refills | Status: DC

## 2018-06-28 MED ORDER — ERYTHROMYCIN BASE 500 MG PO TABS: 500.0000 mg | ORAL_TABLET | Freq: Two times a day (BID) | ORAL | 0 refills | Status: DC

## 2018-06-28 MED ORDER — BISACODYL EC 5 MG PO TBEC: DELAYED_RELEASE_TABLET | ORAL | 0 refills | Status: DC

## 2018-06-28 MED ORDER — POLYETHYLENE GLYCOL 3350 17 GM/SCOOP PO POWD: 1.0000 | Freq: Once | ORAL | 0 refills | Status: AC

## 2018-06-28 NOTE — Progress Notes (Signed)
Outpatient Surgical Follow Up  06/28/2018  SHIANNA BALLY is an 52 y.o. female.   Chief Complaint  Patient presents with  . 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  She was treated with antibiotics and was sent home last week.  She now comes with worsening lower abdominal pain for the last 2 days or so.  No fevers no chills some pneumaturia.  Is not toxic but has only been able to drink some milk shakes.  No emesis. Have a history of recent colonoscopy with no major findings that was performed by Dr. Vicente Males.  I did review the CT scan done less than 2 weeks ago showing evidence of diverticulitis with a diverticular abscess.  Past Medical History:  Diagnosis Date  . Allergy   . Anxiety   . Diabetes mellitus without complication ([place])   . GERD (gastroesophageal reflux disease)   . H/O blood clots 2014   in abdominal aorta - endarterectomy at [facility]  . Hyperlipidemia   . Hypertension   . Left leg claudication [facility])     Past Surgical History:  Procedure Laterality Date  . ABDOMINAL HYSTERECTOMY  2004  . AORTIC ENDARTERECETOMY  2013  . CHOLECYSTECTOMY  1999  . COLONOSCOPY WITH PROPOFOL N/A 04/03/2017   Procedure: COLONOSCOPY WITH PROPOFOL;  Surgeon: Jonathon Bellows, MD;  Location: [facility] ENDOSCOPY;  Service: Endoscopy;  Laterality: N/A;  . ESOPHAGOGASTRODUODENOSCOPY (EGD) WITH PROPOFOL N/A 04/03/2017   Procedure: ESOPHAGOGASTRODUODENOSCOPY (EGD) WITH PROPOFOL;  Surgeon: Jonathon Bellows, MD;  Location: [facility] ENDOSCOPY;  Service: Endoscopy;  Laterality: N/A;  . EUS N/A 04/30/2017   Procedure: FULL UPPER ENDOSCOPIC ULTRASOUND (EUS) RADIAL;  Surgeon: Jola Schmidt, MD;  Location: [facility] ENDOSCOPY;  Service: Endoscopy;  Laterality: N/A;  . FOOT SURGERY Left    plantar fasciitis  . OOPHORECTOMY Bilateral 2004    Family History  Problem Relation Age of  Onset  . Cancer Mother        ovarian  . Cancer Maternal Aunt        ovarian    Social History:  reports that she has been smoking. She has a 20.00 pack-year smoking history. She uses smokeless tobacco. She reports that she does not drink alcohol or use drugs.  Allergies:  Allergies  Allergen Reactions  . Codeine Itching, Nausea And Vomiting, Nausea Only and Other (See Comments)    Other Reaction: Not Assessed Other Reaction: Not Assessed Other Reaction: Not Assessed Other Reaction: Not Assessed    Medications reviewed.    ROS Full ROS performed and is otherwise negative other than what is stated in HPI   BP (!) 149/81   Pulse (!) 120   Temp 97.8 F (36.6 C) (Oral)   Wt 165 lb (74.8 kg)   BMI 30.18 kg/m   Physical Exam  Constitutional: She is oriented to person, place, and time. She appears well-developed and well-nourished. No distress.  Neck: Normal range of motion. Neck supple. No JVD present. No tracheal deviation present. No thyromegaly present.  Cardiovascular: Normal rate and regular rhythm.  Pulmonary/Chest: Effort normal and breath sounds normal. No stridor. No respiratory distress. She has no wheezes.  Abdominal: Soft. She exhibits no distension and no mass. There is no rebound.  Mild tenderness to palpation in left lower quadrant no peritonitis.  Musculoskeletal: Normal range of motion. She exhibits no deformity.  Neurological: She is alert and oriented to person, place, and time. She displays normal reflexes. No cranial nerve deficit. Coordination normal.  Skin: Skin is warm and dry.  Psychiatric: She has a normal mood and affect. Her behavior is normal. Judgment and thought content normal.  Nursing note and vitals reviewed.      Assessment/Plan: Recent episode of complicated diverticulitis now with a chronic and recurrent episode.  Discussed with patient detail about options of repeat imaging with a CT scan versus proceeding to the operating room for  a laparoscopic sigmoid colectomy and possible ostomy.  Patient wants to go ahead and get the surgery done as soon as possible.  She is frustrated has continuing pain.  She is not toxic appearing and no need for emergent surgical intervention or emergent hospitalization.  I have discussed with the patient detail about the operation.  Risk benefit and possible complications including but not limited to: Bleeding, infection, anastomotic leak, injury to adjacent structures, chronic pain.  She understands and wishes to proceed.  Plan for laparoscopic sigmoid colectomy possible ostomy tomorrow.  We will start bowel prep and schedule her Greater than 50% of the 25 minutes  visit was spent in counseling/coordination of care   Caroleen Hamman, MD [place] Surgeon

## 2018-06-28 NOTE — Telephone Encounter (Signed)
Pt advised of pre op date/time and sx date. Sx: 06/29/18 with Dr Marlan Palau sigmoid colectomy possible colostomy.  Pre op: Patient will arrive at 8:30am the day of surgery [facility].

## 2018-06-28 NOTE — H&P (View-Only) (Signed)
Outpatient Surgical Follow Up  06/28/2018  Courtney King is an 52 y.o. female.   Chief Complaint  Patient presents with  . 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  She was treated with antibiotics and was sent home last week.  She now comes with worsening lower abdominal pain for the last 2 days or so.  No fevers no chills some pneumaturia.  Is not toxic but has only been able to drink some milk shakes.  No emesis. Have a history of recent colonoscopy with no major findings that was performed by Dr. Vicente Males.  I did review the CT scan done less than 2 weeks ago showing evidence of diverticulitis with a diverticular abscess.  Past Medical History:  Diagnosis Date  . Allergy   . Anxiety   . Diabetes mellitus without complication ([place])   . GERD (gastroesophageal reflux disease)   . H/O blood clots 2014   in abdominal aorta - endarterectomy at [facility]  . Hyperlipidemia   . Hypertension   . Left leg claudication [facility])     Past Surgical History:  Procedure Laterality Date  . ABDOMINAL HYSTERECTOMY  2004  . AORTIC ENDARTERECETOMY  2013  . CHOLECYSTECTOMY  1999  . COLONOSCOPY WITH PROPOFOL N/A 04/03/2017   Procedure: COLONOSCOPY WITH PROPOFOL;  Surgeon: Jonathon Bellows, MD;  Location: [facility] ENDOSCOPY;  Service: Endoscopy;  Laterality: N/A;  . ESOPHAGOGASTRODUODENOSCOPY (EGD) WITH PROPOFOL N/A 04/03/2017   Procedure: ESOPHAGOGASTRODUODENOSCOPY (EGD) WITH PROPOFOL;  Surgeon: Jonathon Bellows, MD;  Location: [facility] ENDOSCOPY;  Service: Endoscopy;  Laterality: N/A;  . EUS N/A 04/30/2017   Procedure: FULL UPPER ENDOSCOPIC ULTRASOUND (EUS) RADIAL;  Surgeon: Jola Schmidt, MD;  Location: [facility] ENDOSCOPY;  Service: Endoscopy;  Laterality: N/A;  . FOOT SURGERY Left    plantar fasciitis  . OOPHORECTOMY Bilateral 2004    Family History  Problem Relation Age of  Onset  . Cancer Mother        ovarian  . Cancer Maternal Aunt        ovarian    Social History:  reports that she has been smoking. She has a 20.00 pack-year smoking history. She uses smokeless tobacco. She reports that she does not drink alcohol or use drugs.  Allergies:  Allergies  Allergen Reactions  . Codeine Itching, Nausea And Vomiting, Nausea Only and Other (See Comments)    Other Reaction: Not Assessed Other Reaction: Not Assessed Other Reaction: Not Assessed Other Reaction: Not Assessed    Medications reviewed.    ROS Full ROS performed and is otherwise negative other than what is stated in HPI   BP (!) 149/81   Pulse (!) 120   Temp 97.8 F (36.6 C) (Oral)   Wt 165 lb (74.8 kg)   BMI 30.18 kg/m   Physical Exam  Constitutional: She is oriented to person, place, and time. She appears well-developed and well-nourished. No distress.  Neck: Normal range of motion. Neck supple. No JVD present. No tracheal deviation present. No thyromegaly present.  Cardiovascular: Normal rate and regular rhythm.  Pulmonary/Chest: Effort normal and breath sounds normal. No stridor. No respiratory distress. She has no wheezes.  Abdominal: Soft. She exhibits no distension and no mass. There is no rebound.  Mild tenderness to palpation in left lower quadrant no peritonitis.  Musculoskeletal: Normal range of motion. She exhibits no deformity.  Neurological: She is alert and oriented to person, place, and time. She displays normal reflexes. No cranial nerve deficit. Coordination normal.  Skin: Skin is warm and dry.  Psychiatric: She has a normal mood and affect. Her behavior is normal. Judgment and thought content normal.  Nursing note and vitals reviewed.      Assessment/Plan: Recent episode of complicated diverticulitis now with a chronic and recurrent episode.  Discussed with patient detail about options of repeat imaging with a CT scan versus proceeding to the operating room for  a laparoscopic sigmoid colectomy and possible ostomy.  Patient wants to go ahead and get the surgery done as soon as possible.  She is frustrated has continuing pain.  She is not toxic appearing and no need for emergent surgical intervention or emergent hospitalization.  I have discussed with the patient detail about the operation.  Risk benefit and possible complications including but not limited to: Bleeding, infection, anastomotic leak, injury to adjacent structures, chronic pain.  She understands and wishes to proceed.  Plan for laparoscopic sigmoid colectomy possible ostomy tomorrow.  We will start bowel prep and schedule her Greater than 50% of the 25 minutes  visit was spent in counseling/coordination of care   Caroleen Hamman, MD [place] Surgeon

## 2018-06-29 ENCOUNTER — Other Ambulatory Visit: Payer: Self-pay

## 2018-06-29 ENCOUNTER — Inpatient Hospital Stay: Payer: Managed Care, Other (non HMO) | Admitting: Anesthesiology

## 2018-06-29 ENCOUNTER — Encounter
Admission: RE | Disposition: A | Discharge status: Home / Self Care | Payer: Self-pay | Source: Ambulatory Visit | Attending: Surgery

## 2018-06-29 ENCOUNTER — Inpatient Hospital Stay
Admission: RE | Admit: 2018-06-29 | Discharge: 2018-07-02 | DRG: 330 | Disposition: A | Discharge status: Home / Self Care | Payer: Managed Care, Other (non HMO) | Source: Ambulatory Visit | Attending: Surgery | Admitting: Surgery

## 2018-06-29 DIAGNOSIS — K5732 Diverticulitis of large intestine without perforation or abscess without bleeding: Secondary | ICD-10-CM

## 2018-06-29 DIAGNOSIS — F17210 Nicotine dependence, cigarettes, uncomplicated: Secondary | ICD-10-CM | POA: Diagnosis present

## 2018-06-29 DIAGNOSIS — Z885 Allergy status to narcotic agent status: Secondary | ICD-10-CM | POA: Diagnosis not present

## 2018-06-29 DIAGNOSIS — E119 Type 2 diabetes mellitus without complications: Secondary | ICD-10-CM | POA: Diagnosis present

## 2018-06-29 DIAGNOSIS — K219 Gastro-esophageal reflux disease without esophagitis: Secondary | ICD-10-CM | POA: Diagnosis present

## 2018-06-29 DIAGNOSIS — K5720 Diverticulitis of large intestine with perforation and abscess without bleeding: Secondary | ICD-10-CM | POA: Diagnosis present

## 2018-06-29 DIAGNOSIS — K660 Peritoneal adhesions (postprocedural) (postinfection): Secondary | ICD-10-CM | POA: Diagnosis present

## 2018-06-29 DIAGNOSIS — Z86718 Personal history of other venous thrombosis and embolism: Secondary | ICD-10-CM

## 2018-06-29 DIAGNOSIS — E785 Hyperlipidemia, unspecified: Secondary | ICD-10-CM | POA: Diagnosis present

## 2018-06-29 DIAGNOSIS — I2510 Atherosclerotic heart disease of native coronary artery without angina pectoris: Secondary | ICD-10-CM | POA: Diagnosis present

## 2018-06-29 DIAGNOSIS — I10 Essential (primary) hypertension: Secondary | ICD-10-CM | POA: Diagnosis present

## 2018-06-29 DIAGNOSIS — N321 Vesicointestinal fistula: Secondary | ICD-10-CM | POA: Diagnosis present

## 2018-06-29 HISTORY — PX: LAPAROSCOPIC SIGMOID COLECTOMY: SHX5928

## 2018-06-29 LAB — CREATININE, SERUM
Creatinine, Ser: 1.07 mg/dL — ABNORMAL HIGH (ref 0.44–1.00)
GFR calc Af Amer: >60 mL/min (ref >60)
GFR calc non Af Amer: 59 mL/min — ABNORMAL LOW (ref >60)

## 2018-06-29 LAB — CBC
HEMATOCRIT: 42.1 % (ref 35.0–47.0)
HEMOGLOBIN: 14.2 g/dL (ref 12.0–16.0)
MCH: 31.7 pg (ref 26.0–34.0)
MCHC: 33.8 g/dL (ref 32.0–36.0)
MCV: 93.8 fL (ref 80.0–100.0)
Platelets: 419 10*3/uL (ref 150–440)
RBC: 4.49 MIL/uL (ref 3.80–5.20)
RDW: 13.8 % (ref 11.5–14.5)
WBC: 19.5 10*3/uL — ABNORMAL HIGH (ref 3.6–11.0)

## 2018-06-29 LAB — GLUCOSE, CAPILLARY
Glucose-Capillary: 113 mg/dL — ABNORMAL HIGH (ref 70–99)
Glucose-Capillary: 167 mg/dL — ABNORMAL HIGH (ref 70–99)

## 2018-06-29 SURGERY — COLECTOMY, SIGMOID, LAPAROSCOPIC
Anesthesia: General | Site: Abdomen | Wound class: Dirty or Infected

## 2018-06-29 MED ORDER — ONDANSETRON 4 MG PO TBDP: 4.0000 mg | ORAL_TABLET | Freq: Four times a day (QID) | ORAL | Status: DC | PRN

## 2018-06-29 MED ORDER — ROCURONIUM BROMIDE 50 MG/5ML IV SOLN
INTRAVENOUS | Status: AC
  Filled 2018-06-29: qty 1

## 2018-06-29 MED ORDER — PANTOPRAZOLE SODIUM 40 MG PO TBEC
40.0000 mg | DELAYED_RELEASE_TABLET | Freq: Every day | ORAL | Status: DC
  Administered 2018-06-30 – 2018-07-01 (×2): 40 mg via ORAL
  Filled 2018-06-29 (×2): qty 1

## 2018-06-29 MED ORDER — FENTANYL CITRATE (PF) 100 MCG/2ML IJ SOLN
INTRAMUSCULAR | Status: AC
  Filled 2018-06-29: qty 2

## 2018-06-29 MED ORDER — ONDANSETRON HCL 4 MG/2ML IJ SOLN: 4.0000 mg | Freq: Once | INTRAMUSCULAR | Status: DC | PRN

## 2018-06-29 MED ORDER — GABAPENTIN 800 MG PO TABS
400.0000 mg | ORAL_TABLET | Freq: Three times a day (TID) | ORAL | Status: DC
  Filled 2018-06-29: qty 0.5

## 2018-06-29 MED ORDER — CELECOXIB 200 MG PO CAPS: 200.0000 mg | ORAL_CAPSULE | ORAL | Status: DC

## 2018-06-29 MED ORDER — BUPIVACAINE-EPINEPHRINE (PF) 0.25% -1:200000 IJ SOLN
INTRAMUSCULAR | Status: AC
  Filled 2018-06-29: qty 30

## 2018-06-29 MED ORDER — BUPIVACAINE-EPINEPHRINE (PF) 0.25% -1:200000 IJ SOLN
INTRAMUSCULAR | Status: DC | PRN
  Administered 2018-06-29: 30 mL via PERINEURAL

## 2018-06-29 MED ORDER — ONDANSETRON HCL 4 MG/2ML IJ SOLN: 4.0000 mg | Freq: Four times a day (QID) | INTRAMUSCULAR | Status: DC | PRN

## 2018-06-29 MED ORDER — SEVOFLURANE IN SOLN
RESPIRATORY_TRACT | Status: AC
  Filled 2018-06-29: qty 250

## 2018-06-29 MED ORDER — PROPOFOL 10 MG/ML IV BOLUS
INTRAVENOUS | Status: AC
  Filled 2018-06-29: qty 20

## 2018-06-29 MED ORDER — KETOROLAC TROMETHAMINE 30 MG/ML IJ SOLN
30.0000 mg | Freq: Four times a day (QID) | INTRAMUSCULAR | Status: DC
  Administered 2018-06-29 – 2018-07-01 (×5): 30 mg via INTRAVENOUS
  Filled 2018-06-29 (×7): qty 1

## 2018-06-29 MED ORDER — ACETAMINOPHEN 500 MG PO TABS
1000.0000 mg | ORAL_TABLET | ORAL | Status: AC
  Administered 2018-06-29: 1000 mg via ORAL

## 2018-06-29 MED ORDER — FENTANYL CITRATE (PF) 100 MCG/2ML IJ SOLN
INTRAMUSCULAR | Status: AC
  Administered 2018-06-29: 25 ug via INTRAVENOUS
  Filled 2018-06-29: qty 2

## 2018-06-29 MED ORDER — GLYCOPYRROLATE 0.2 MG/ML IJ SOLN
INTRAMUSCULAR | Status: DC | PRN
  Administered 2018-06-29: 0.2 mg via INTRAVENOUS

## 2018-06-29 MED ORDER — BUPIVACAINE LIPOSOME 1.3 % IJ SUSP: 20.0000 mL | Freq: Once | INTRAMUSCULAR | Status: DC

## 2018-06-29 MED ORDER — PROPOFOL 10 MG/ML IV BOLUS
INTRAVENOUS | Status: DC | PRN
  Administered 2018-06-29: 160 mg via INTRAVENOUS

## 2018-06-29 MED ORDER — ONDANSETRON HCL 4 MG/2ML IJ SOLN
INTRAMUSCULAR | Status: AC
  Filled 2018-06-29: qty 2

## 2018-06-29 MED ORDER — ROCURONIUM BROMIDE 100 MG/10ML IV SOLN
INTRAVENOUS | Status: DC | PRN
  Administered 2018-06-29 (×3): 10 mg via INTRAVENOUS
  Administered 2018-06-29: 20 mg via INTRAVENOUS
  Administered 2018-06-29: 50 mg via INTRAVENOUS

## 2018-06-29 MED ORDER — CELECOXIB 200 MG PO CAPS
ORAL_CAPSULE | ORAL | Status: AC
  Administered 2018-06-29: 200 mg
  Filled 2018-06-29: qty 1

## 2018-06-29 MED ORDER — OXYCODONE HCL 5 MG PO TABS
5.0000 mg | ORAL_TABLET | ORAL | Status: DC | PRN
  Administered 2018-06-29: 5 mg via ORAL
  Administered 2018-06-30: 10 mg via ORAL
  Administered 2018-06-30: 5 mg via ORAL
  Administered 2018-06-30: 10 mg via ORAL
  Filled 2018-06-29 (×2): qty 1
  Filled 2018-06-29 (×2): qty 2

## 2018-06-29 MED ORDER — SODIUM CHLORIDE 0.9 % IV SOLN
INTRAVENOUS | Status: DC
  Administered 2018-06-29: 10:00:00 via INTRAVENOUS

## 2018-06-29 MED ORDER — HYDROMORPHONE HCL 1 MG/ML IJ SOLN
INTRAMUSCULAR | Status: AC
  Filled 2018-06-29: qty 1

## 2018-06-29 MED ORDER — MIDAZOLAM HCL 2 MG/2ML IJ SOLN
INTRAMUSCULAR | Status: AC
  Filled 2018-06-29: qty 2

## 2018-06-29 MED ORDER — HYDROXYZINE HCL 25 MG PO TABS: 25.0000 mg | ORAL_TABLET | Freq: Three times a day (TID) | ORAL | Status: DC | PRN

## 2018-06-29 MED ORDER — VENLAFAXINE HCL ER 75 MG PO CP24
75.0000 mg | ORAL_CAPSULE | Freq: Every day | ORAL | Status: DC
  Administered 2018-06-30 – 2018-07-01 (×2): 75 mg via ORAL
  Filled 2018-06-29 (×3): qty 1

## 2018-06-29 MED ORDER — FENTANYL CITRATE (PF) 100 MCG/2ML IJ SOLN
25.0000 ug | INTRAMUSCULAR | Status: AC | PRN
  Administered 2018-06-29 (×6): 25 ug via INTRAVENOUS

## 2018-06-29 MED ORDER — ACETAMINOPHEN 500 MG PO TABS
1000.0000 mg | ORAL_TABLET | Freq: Four times a day (QID) | ORAL | Status: DC
  Administered 2018-06-29 – 2018-07-02 (×5): 1000 mg via ORAL
  Filled 2018-06-29 (×7): qty 2

## 2018-06-29 MED ORDER — HEPARIN SODIUM (PORCINE) 5000 UNIT/ML IJ SOLN
5000.0000 [IU] | Freq: Once | INTRAMUSCULAR | Status: AC
  Administered 2018-06-29: 5000 [IU] via SUBCUTANEOUS

## 2018-06-29 MED ORDER — ACETAMINOPHEN 500 MG PO TABS
ORAL_TABLET | ORAL | Status: AC
  Administered 2018-06-29: 1000 mg via ORAL
  Filled 2018-06-29: qty 2

## 2018-06-29 MED ORDER — ONDANSETRON HCL 4 MG/2ML IJ SOLN
INTRAMUSCULAR | Status: DC | PRN
  Administered 2018-06-29: 4 mg via INTRAVENOUS

## 2018-06-29 MED ORDER — HYDROMORPHONE HCL 1 MG/ML IJ SOLN
0.5000 mg | INTRAMUSCULAR | Status: DC | PRN
  Administered 2018-06-29 (×2): 0.5 mg via INTRAVENOUS

## 2018-06-29 MED ORDER — CHLORHEXIDINE GLUCONATE CLOTH 2 % EX PADS: 6.0000 | MEDICATED_PAD | Freq: Once | CUTANEOUS | Status: DC

## 2018-06-29 MED ORDER — MIDAZOLAM HCL 2 MG/2ML IJ SOLN
INTRAMUSCULAR | Status: DC | PRN
  Administered 2018-06-29: 2 mg via INTRAVENOUS

## 2018-06-29 MED ORDER — DEXAMETHASONE SODIUM PHOSPHATE 10 MG/ML IJ SOLN
INTRAMUSCULAR | Status: AC
  Filled 2018-06-29: qty 1

## 2018-06-29 MED ORDER — LACTATED RINGERS IV SOLN
INTRAVENOUS | Status: DC
  Administered 2018-06-29: 21:00:00 via INTRAVENOUS

## 2018-06-29 MED ORDER — PHENYLEPHRINE HCL 10 MG/ML IJ SOLN
INTRAMUSCULAR | Status: DC | PRN
  Administered 2018-06-29: 200 ug via INTRAVENOUS
  Administered 2018-06-29: 100 ug via INTRAVENOUS
  Administered 2018-06-29: 200 ug via INTRAVENOUS
  Administered 2018-06-29: 100 ug via INTRAVENOUS
  Administered 2018-06-29: 200 ug via INTRAVENOUS
  Administered 2018-06-29: 100 ug via INTRAVENOUS
  Administered 2018-06-29: 200 ug via INTRAVENOUS

## 2018-06-29 MED ORDER — MORPHINE SULFATE (PF) 4 MG/ML IV SOLN: 4.0000 mg | INTRAVENOUS | Status: DC | PRN

## 2018-06-29 MED ORDER — SODIUM CHLORIDE 0.9 % IV SOLN
INTRAVENOUS | Status: DC | PRN
  Administered 2018-06-29: 100 mL

## 2018-06-29 MED ORDER — BUPIVACAINE LIPOSOME 1.3 % IJ SUSP
INTRAMUSCULAR | Status: AC
  Filled 2018-06-29: qty 20

## 2018-06-29 MED ORDER — HYDROMORPHONE HCL 1 MG/ML IJ SOLN
INTRAMUSCULAR | Status: DC | PRN
  Administered 2018-06-29: .5 mg via INTRAVENOUS
  Administered 2018-06-29: 0.5 mg via INTRAVENOUS

## 2018-06-29 MED ORDER — LIDOCAINE HCL (PF) 2 % IJ SOLN
INTRAMUSCULAR | Status: AC
  Filled 2018-06-29: qty 10

## 2018-06-29 MED ORDER — PHENYLEPHRINE HCL 10 MG/ML IJ SOLN
INTRAMUSCULAR | Status: DC | PRN
  Administered 2018-06-29: 50 ug/min via INTRAVENOUS

## 2018-06-29 MED ORDER — PROCHLORPERAZINE EDISYLATE 10 MG/2ML IJ SOLN
5.0000 mg | Freq: Four times a day (QID) | INTRAMUSCULAR | Status: DC | PRN
  Filled 2018-06-29: qty 2

## 2018-06-29 MED ORDER — CLONAZEPAM 0.5 MG PO TABS: 0.5000 mg | ORAL_TABLET | Freq: Two times a day (BID) | ORAL | Status: DC | PRN

## 2018-06-29 MED ORDER — SUGAMMADEX SODIUM 200 MG/2ML IV SOLN
INTRAVENOUS | Status: DC | PRN
  Administered 2018-06-29: 200 mg via INTRAVENOUS

## 2018-06-29 MED ORDER — DEXAMETHASONE SODIUM PHOSPHATE 10 MG/ML IJ SOLN
INTRAMUSCULAR | Status: DC | PRN
  Administered 2018-06-29: 5 mg via INTRAVENOUS

## 2018-06-29 MED ORDER — GABAPENTIN 300 MG PO CAPS
300.0000 mg | ORAL_CAPSULE | ORAL | Status: AC
  Administered 2018-06-29: 300 mg via ORAL

## 2018-06-29 MED ORDER — HYDROMORPHONE HCL 1 MG/ML IJ SOLN: 0.5000 mg | INTRAMUSCULAR | Status: DC | PRN

## 2018-06-29 MED ORDER — FENTANYL CITRATE (PF) 100 MCG/2ML IJ SOLN
INTRAMUSCULAR | Status: DC | PRN
  Administered 2018-06-29: 100 ug via INTRAVENOUS

## 2018-06-29 MED ORDER — GABAPENTIN 300 MG PO CAPS
ORAL_CAPSULE | ORAL | Status: AC
  Administered 2018-06-29: 300 mg via ORAL
  Filled 2018-06-29: qty 1

## 2018-06-29 MED ORDER — ENOXAPARIN SODIUM 40 MG/0.4ML ~~LOC~~ SOLN
40.0000 mg | SUBCUTANEOUS | Status: DC
  Administered 2018-06-30 – 2018-07-01 (×2): 40 mg via SUBCUTANEOUS
  Filled 2018-06-29 (×3): qty 0.4

## 2018-06-29 MED ORDER — LINACLOTIDE 145 MCG PO CAPS
145.0000 ug | ORAL_CAPSULE | Freq: Every day | ORAL | Status: DC
  Administered 2018-06-30: 145 ug via ORAL
  Filled 2018-06-29 (×3): qty 1

## 2018-06-29 MED ORDER — GABAPENTIN 400 MG PO CAPS
400.0000 mg | ORAL_CAPSULE | Freq: Three times a day (TID) | ORAL | Status: DC
  Administered 2018-06-29 – 2018-07-01 (×4): 400 mg via ORAL
  Filled 2018-06-29 (×5): qty 1

## 2018-06-29 MED ORDER — LIDOCAINE HCL (CARDIAC) PF 100 MG/5ML IV SOSY
PREFILLED_SYRINGE | INTRAVENOUS | Status: DC | PRN
  Administered 2018-06-29: 100 mg via INTRAVENOUS

## 2018-06-29 MED ORDER — HEPARIN SODIUM (PORCINE) 5000 UNIT/ML IJ SOLN
INTRAMUSCULAR | Status: AC
  Administered 2018-06-29: 5000 [IU] via SUBCUTANEOUS
  Filled 2018-06-29: qty 1

## 2018-06-29 MED ORDER — PROCHLORPERAZINE MALEATE 10 MG PO TABS
10.0000 mg | ORAL_TABLET | Freq: Four times a day (QID) | ORAL | Status: DC | PRN
  Filled 2018-06-29: qty 1

## 2018-06-29 MED ORDER — SUGAMMADEX SODIUM 200 MG/2ML IV SOLN
INTRAVENOUS | Status: AC
  Filled 2018-06-29: qty 2

## 2018-06-29 SURGICAL SUPPLY — 82 items
APPLIER CLIP 13 LRG OPEN (CLIP) ×3
BAG DECANTER FOR FLEXI CONT (MISCELLANEOUS) ×3 IMPLANT
BLADE CLIPPER SURG (BLADE) ×3 IMPLANT
BLADE SURG 15 STRL LF DISP TIS (BLADE) ×2 IMPLANT
BLADE SURG 15 STRL SS (BLADE) ×1
BLADE SURG SZ10 CARB STEEL (BLADE) ×3 IMPLANT
BLADE SURG SZ11 CARB STEEL (BLADE) ×3 IMPLANT
BULB RESERV EVAC DRAIN JP 100C (MISCELLANEOUS) ×6 IMPLANT
CANISTER SUCT 1200ML W/VALVE (MISCELLANEOUS) ×3 IMPLANT
CATH ROBINSON RED 14FR (CATHETERS) ×2 IMPLANT
CATH ROBINSON RED A/P 16FR (CATHETERS) ×3 IMPLANT
CATH URET ROBINSON RED 14FR (CATHETERS) ×1
CHLORAPREP W/TINT 26ML (MISCELLANEOUS) ×3 IMPLANT
CLIP APPLIE 13 LRG OPEN (CLIP) ×2 IMPLANT
DEFOGGER SCOPE WARMER CLEARIFY (MISCELLANEOUS) ×3 IMPLANT
DERMABOND ADVANCED (GAUZE/BANDAGES/DRESSINGS) ×2
DERMABOND ADVANCED .7 DNX12 (GAUZE/BANDAGES/DRESSINGS) ×4 IMPLANT
DRAIN CHANNEL JP 15F RND 16 (MISCELLANEOUS) ×3 IMPLANT
DRAPE INCISE IOBAN 66X45 STRL (DRAPES) ×3 IMPLANT
DRAPE LEGGINS SURG 28X43 STRL (DRAPES) ×3 IMPLANT
DRAPE UNDER BUTTOCK W/FLU (DRAPES) ×3 IMPLANT
DRSG OPSITE POSTOP 3X4 (GAUZE/BANDAGES/DRESSINGS) ×6 IMPLANT
DRSG OPSITE POSTOP 4X6 (GAUZE/BANDAGES/DRESSINGS) ×3 IMPLANT
DRSG TEGADERM 2-3/8X2-3/4 SM (GAUZE/BANDAGES/DRESSINGS) ×3 IMPLANT
ELECT BLADE 6.5 EXT (BLADE) ×3 IMPLANT
ELECT REM PT RETURN 9FT ADLT (ELECTROSURGICAL) ×3
ELECTRODE REM PT RTRN 9FT ADLT (ELECTROSURGICAL) ×2 IMPLANT
GAUZE SPONGE 4X4 12PLY STRL (GAUZE/BANDAGES/DRESSINGS) ×3 IMPLANT
GAUZE STRETCH 2X75IN STRL (MISCELLANEOUS) ×3 IMPLANT
GLOVE BIO SURGEON STRL SZ7 (GLOVE) ×9 IMPLANT
GOWN STRL REUS W/TWL LRG LVL4 (GOWN DISPOSABLE) ×12 IMPLANT
HANDLE SUCTION POOLE (INSTRUMENTS) ×2 IMPLANT
HANDLE YANKAUER SUCT BULB TIP (MISCELLANEOUS) ×3 IMPLANT
IRRIGATION STRYKERFLOW (MISCELLANEOUS) ×2 IMPLANT
IRRIGATOR STRYKERFLOW (MISCELLANEOUS) ×3
IV NS 1000ML (IV SOLUTION) ×1
IV NS 1000ML BAXH (IV SOLUTION) ×2 IMPLANT
JACKSON PRATT 10 (INSTRUMENTS) ×3 IMPLANT
L-HOOK LAP DISP 36CM (ELECTROSURGICAL) ×3
LHOOK LAP DISP 36CM (ELECTROSURGICAL) ×2 IMPLANT
MARKER SKIN DUAL TIP RULER LAB (MISCELLANEOUS) ×3 IMPLANT
NEEDLE HYPO 22GX1.5 SAFETY (NEEDLE) ×3 IMPLANT
NS IRRIG 1000ML POUR BTL (IV SOLUTION) ×3 IMPLANT
NS IRRIG 500ML POUR BTL (IV SOLUTION) ×3 IMPLANT
PACK COLON CLEAN CLOSURE (MISCELLANEOUS) ×3 IMPLANT
PACK LAP CHOLECYSTECTOMY (MISCELLANEOUS) ×3 IMPLANT
PENCIL ELECTRO HAND CTR (MISCELLANEOUS) ×3 IMPLANT
RELOAD STAPLER BLUE 60MM (STAPLE) ×6 IMPLANT
RELOAD STAPLER WHITE 60MM (STAPLE) ×4 IMPLANT
SCISSORS METZENBAUM CVD 33 (INSTRUMENTS) ×3 IMPLANT
SHEARS HARMONIC ACE PLUS 36CM (ENDOMECHANICALS) ×3 IMPLANT
SLEEVE ENDOPATH XCEL 5M (ENDOMECHANICALS) ×3 IMPLANT
SPONGE LAP 18X18 RF (DISPOSABLE) ×9 IMPLANT
STAPLE ECHEON FLEX 60 POW ENDO (STAPLE) ×3 IMPLANT
STAPLER CIRCULAR 29MM (STAPLE) IMPLANT
STAPLER ECHELON LONG 60 440 (INSTRUMENTS) ×3 IMPLANT
STAPLER ENDO ILS CVD 18 33 (STAPLE) IMPLANT
STAPLER PROX 25M (MISCELLANEOUS) ×3 IMPLANT
STAPLER RELOAD BLUE 60MM (STAPLE) ×9
STAPLER RELOAD WHITE 60MM (STAPLE) ×6
STAPLER SKIN PROX 35W (STAPLE) ×3 IMPLANT
SUCT SIGMOIDOSCOPE TIP 18 W/TU (SUCTIONS) ×3 IMPLANT
SUCTION POOLE HANDLE (INSTRUMENTS) ×3
SUT MNCRL AB 4-0 PS2 18 (SUTURE) ×6 IMPLANT
SUT PDS AB 0 CT1 27 (SUTURE) ×6 IMPLANT
SUT SILK 2 0 (SUTURE) ×1
SUT SILK 2 0SH CR/8 30 (SUTURE) ×3 IMPLANT
SUT SILK 2-0 (SUTURE) ×3 IMPLANT
SUT SILK 2-0 18XBRD TIE 12 (SUTURE) ×2 IMPLANT
SUT SILK 3-0 (SUTURE) ×3 IMPLANT
SUT VIC AB 2-0 SH 27 (SUTURE) ×2
SUT VIC AB 2-0 SH 27XBRD (SUTURE) ×4 IMPLANT
SYR 20CC LL (SYRINGE) ×6 IMPLANT
SYR 50ML LL SCALE MARK (SYRINGE) ×3 IMPLANT
SYRINGE IRR TOOMEY STRL 70CC (SYRINGE) ×6 IMPLANT
SYS LAPSCP GELPORT 120MM (MISCELLANEOUS) ×3
SYSTEM LAPSCP GELPORT 120MM (MISCELLANEOUS) ×2 IMPLANT
TOWEL OR 17X26 4PK STRL BLUE (TOWEL DISPOSABLE) ×6 IMPLANT
TRAY FOLEY MTR SLVR 16FR STAT (SET/KITS/TRAYS/PACK) ×3 IMPLANT
TROCAR XCEL 12X100 BLDLESS (ENDOMECHANICALS) ×3 IMPLANT
TROCAR XCEL NON-BLD 5MMX100MML (ENDOMECHANICALS) ×3 IMPLANT
TUBING INSUFFLATION (TUBING) ×3 IMPLANT

## 2018-06-29 NOTE — Anesthesia Postprocedure Evaluation (Signed)
Anesthesia Post Note  Patient: Courtney King  Procedure(s) Performed: LAPAROSCOPIC SIGMOID COLECTOMY (N/A Abdomen)  Patient location during evaluation: PACU Anesthesia Type: General Level of consciousness: awake and alert Pain management: pain level controlled Vital Signs Assessment: post-procedure vital signs reviewed and stable Respiratory status: spontaneous breathing, nonlabored ventilation, respiratory function stable and patient connected to nasal cannula oxygen Cardiovascular status: blood pressure returned to baseline and stable Postop Assessment: no apparent nausea or vomiting Anesthetic complications: no     Last Vitals:  Vitals:   06/29/18 1913 06/29/18 1918  BP:    Pulse: 96 98  Resp: 10 16  Temp:    SpO2: 97% 97%    Last Pain:  Vitals:   06/29/18 1918  TempSrc:   PainSc: 6                  Precious Haws Piscitello

## 2018-06-29 NOTE — Anesthesia Procedure Notes (Signed)
Procedure Name: Intubation Date/Time: 06/29/2018 1:51 PM Performed by: Johnna Acosta, CRNA Pre-anesthesia Checklist: Patient identified, Emergency Drugs available, Suction available, Patient being monitored and Timeout performed Patient Re-evaluated:Patient Re-evaluated prior to induction Oxygen Delivery Method: Circle system utilized Preoxygenation: Pre-oxygenation with 100% oxygen Induction Type: IV induction Ventilation: Mask ventilation without difficulty and Oral airway inserted - appropriate to patient size Laryngoscope Size: Sabra Heck and 2 Grade View: Grade I Tube type: Oral Tube size: 7.5 mm Number of attempts: 1 Airway Equipment and Method: Stylet Placement Confirmation: ETT inserted through vocal cords under direct vision,  positive ETCO2 and breath sounds checked- equal and bilateral Secured at: 20 cm Tube secured with: Tape Dental Injury: Teeth and Oropharynx as per pre-operative assessment

## 2018-06-29 NOTE — Anesthesia Preprocedure Evaluation (Signed)
Anesthesia Evaluation  Patient identified by MRN, date of birth, ID band Patient awake    Reviewed: Allergy & Precautions, NPO status , Patient's Chart, lab work & pertinent test results, reviewed documented beta blocker date and time   Airway Mallampati: II  TM Distance: >3 FB     Dental  (+) Chipped   Pulmonary Current Smoker,           Cardiovascular hypertension, Pt. on medications + CAD       Neuro/Psych PSYCHIATRIC DISORDERS Anxiety    GI/Hepatic GERD  Controlled,  Endo/Other  diabetes, Type 2  Renal/GU      Musculoskeletal   Abdominal   Peds  Hematology   Anesthesia Other Findings   Reproductive/Obstetrics                             Anesthesia Physical Anesthesia Plan  ASA: III  Anesthesia Plan: General   Post-op Pain Management:    Induction: Intravenous  PONV Risk Score and Plan:   Airway Management Planned: Oral ETT  Additional Equipment:   Intra-op Plan:   Post-operative Plan:   Informed Consent: I have reviewed the patients History and Physical, chart, labs and discussed the procedure including the risks, benefits and alternatives for the proposed anesthesia with the patient or authorized representative who has indicated his/her understanding and acceptance.     Plan Discussed with: CRNA  Anesthesia Plan Comments:         Anesthesia Quick Evaluation

## 2018-06-29 NOTE — Anesthesia Post-op Follow-up Note (Signed)
Anesthesia QCDR form completed.        

## 2018-06-29 NOTE — Interval H&P Note (Signed)
History and Physical Interval Note:  06/29/2018 12:42 PM  Courtney King  has presented today for surgery, with the diagnosis of N/A  The various methods of treatment have been discussed with the patient and family. After consideration of risks, benefits and other options for treatment, the patient has consented to  Procedure(s): LAPAROSCOPIC SIGMOID COLECTOMY (N/A) COLOSTOMY (N/A) as a surgical intervention .  The patient's history has been reviewed, patient examined, no change in status, stable for surgery.  I have reviewed the patient's chart and labs.  Questions were answered to the patient's satisfaction.     Garrett

## 2018-06-29 NOTE — Op Note (Addendum)
PROCEDURES: 1. Laparoscopic lysis of adhesions 2. Laparoscopic Low Anterior resection 3. Laparoscopic takedown of splenic flexure  Pre-operative Diagnosis: Chronic Diverticulitis w Colovesical fistula   Post-operative Diagnosis: Same  Surgeon: Kalilah Barua F Zacary Bauer   Assistants:Dr. Genevive Bi , required for anastomosis and exposure  Anesthesia: General endotracheal anesthesia  ASA Class: 2   Surgeon: Caroleen Hamman , MD FACS  Anesthesia: Gen. with endotracheal tube  Findings: Chronic Diverticulitis w Phlegmon attached to the bladder, No need for bladder repair Tension free anastomosis with no intraop leak  Estimated Blood Loss: 200cc         Drains: 15 Blake drain         Specimens: colon         Complications: none               Condition: Stable  Procedure Details  The patient was seen again in the Holding Room. The benefits, complications, treatment options, and expected outcomes were discussed with the patient. The risks of bleeding, infection, recurrence of symptoms, failure to resolve symptoms,  bowel injury, any of which could require further surgery were reviewed with the patient.   The patient was taken to Operating Room, identified as Courtney King and the procedure verified.  A Time Out was held and the above information confirmed.  Prior to the induction of general anesthesia, antibiotic prophylaxis was administered. VTE prophylaxis was in place. General endotracheal anesthesia was then administered and tolerated well. After the induction, the abdomen was prepped with Chloraprep and draped in the sterile fashion. The patient was positioned in the supine position. Prior to the induction of general anesthesia, antibiotic prophylaxis was administered. VTE prophylaxis was in place. General endotracheal anesthesia was then administered and tolerated well. After the induction, the abdomen was prepped with Chloraprep and draped in the sterile fashion. The patient was positioned in  lithotomy position. 7 cm incision was created as a midline mini laparotomy. The abdominal cavity was entered under direct visualization and the GelPort device was placed. A 5 mm port was placed in the suprapubic area under direct visualization and pneumoperitoneum was obtained. There were dense adhesions from the omentum to the abdominal wall that where lysed in the standard fashion with the Harmonic scalpel. We also were able to place a 12 mm port in the right lower quadrant and a 5 mm port in the left lower quadrant under direct visualization. There was significant adhesive disease in the pelvis from the sigmoid to the pelvic wall and also from the sigmoid to the bladder. These adhesions were lysed with a combination of finger fracturing and Harmonic scalpel. The white line of Toldt was identified and divided and we mobilized the descending colon IN a lateral to medial fashion. We preserved the ureter at all times. We were also able to mobilize the splenic flexure using Harmonic scalpel in the standard fashion. We identified the takeoff of the inferior mesenteric artery dissected the pedicle and divided using a 60 mm vascular echelon stapler in the standard fashion. Using the Harmonic's scalpel were able to divide the mesorectum and and also divided proximal to the mesentery of the descending colon. Once we have an adequate visualization and mobilization we divided the sigmoid colon distally after mobilizing the peritoneal reflexion on the rectum. Distally rectum was divided with multiple blue loads using the echelon stapler. Wel remove the GelPort and visualized the colon in a direct fashion. We divided  the mid descending colon with standard 60 mm blue load. We  opened the colon and measure the diameter of the bowel. A 25 mm dilator was perfect size. A pursestring was used after in certain the anvil device. Dr. Genevive Bi was able to pass a 25 mm standard EEA stapler device through the anus and we had a little bit  of difficulty but were able to finally pass the device through the end of the rectal stump. Under direct visualization we perform an end to end anastomosis with the EEA device. A leak test was performed inflating the colon with a Toomey syringe and a rubber catheter. No evidence of leak was observed. There was also adequate hemostasis. We irrigated the bladder and did not visualize any leakage of urine. No need for bladder repair.   A 15 Blake drain was placed in the pelvis. We were able to mobilize the omentum and I created an omental flap to attach it to the anastomosis. The drain was sutured in place with a 3-0 Nylon. All the laparoscopic ports were removed and a second look showed no evidence of any bleeding or any other injuries. We changed gloves and place a new tray to close the abdomen with a 0 PDS suture in a running fashion and the skin was closed withstaples. Liposomal Marcaine was injected on all incision sites under direct visualization. Needle and laparotomy count were correct and there were no immediate complications.  Caroleen Hamman, MD, FACS

## 2018-06-29 NOTE — Transfer of Care (Signed)
Immediate Anesthesia Transfer of Care Note  Patient: Courtney King  Procedure(s) Performed: LAPAROSCOPIC SIGMOID COLECTOMY (N/A Abdomen)  Patient Location: PACU  Anesthesia Type:General  Level of Consciousness: drowsy and patient cooperative  Airway & Oxygen Therapy: Patient Spontanous Breathing and Patient connected to face mask oxygen  Post-op Assessment: Report given to RN and Post -op Vital signs reviewed and stable  Post vital signs: Reviewed and stable  Last Vitals:  Vitals Value Taken Time  BP 153/56 06/29/2018  6:08 PM  Temp    Pulse 101 06/29/2018  6:11 PM  Resp 24 06/29/2018  6:11 PM  SpO2 100 % 06/29/2018  6:11 PM  Vitals shown include unvalidated device data.  Last Pain:  Vitals:   06/29/18 0847  TempSrc:   PainSc: 0-No pain         Complications: No apparent anesthesia complications

## 2018-06-30 ENCOUNTER — Encounter: Payer: Self-pay | Admitting: Surgery

## 2018-06-30 LAB — CBC
HCT: 38 % (ref 35.0–47.0)
HEMOGLOBIN: 12.7 g/dL (ref 12.0–16.0)
MCH: 31.2 pg (ref 26.0–34.0)
MCHC: 33.4 g/dL (ref 32.0–36.0)
MCV: 93.4 fL (ref 80.0–100.0)
PLATELETS: 359 10*3/uL (ref 150–440)
RBC: 4.07 MIL/uL (ref 3.80–5.20)
RDW: 13.6 % (ref 11.5–14.5)
WBC: 17 10*3/uL — AB (ref 3.6–11.0)

## 2018-06-30 LAB — BASIC METABOLIC PANEL
ANION GAP: 9 (ref 5–15)
BUN: 12 mg/dL (ref 6–20)
CALCIUM: 8 mg/dL — AB (ref 8.9–10.3)
CO2: 19 mmol/L — ABNORMAL LOW (ref 22–32)
Chloride: 113 mmol/L — ABNORMAL HIGH (ref 98–111)
Creatinine, Ser: 0.77 mg/dL (ref 0.44–1.00)
GLUCOSE: 181 mg/dL — AB (ref 70–99)
POTASSIUM: 4.5 mmol/L (ref 3.5–5.1)
Sodium: 141 mmol/L (ref 135–145)

## 2018-06-30 LAB — MAGNESIUM: Magnesium: 1.8 mg/dL (ref 1.7–2.4)

## 2018-06-30 NOTE — Progress Notes (Signed)
POD # 1 Doing well + Flatus AVSS Taking clears Labs ok  PE NAD Abd: soft, dressing intact, no infection. Serosanguinous drainage JP  A/p DOing well Full liquids Dc foley mobilize

## 2018-07-01 LAB — SURGICAL PATHOLOGY

## 2018-07-01 NOTE — Progress Notes (Signed)
POD # 2 AVSS Having multiple loose BM, + flatus Still having pain JP serosanguinous  PE NAD Abd: soft, incisions c/d/i. No infection or peritonitis  A/p Doing well Mobilize Still having pain Likely DC in am Wean off narcotics

## 2018-07-02 ENCOUNTER — Telehealth: Payer: Self-pay

## 2018-07-02 MED ORDER — OXYCODONE-ACETAMINOPHEN 5-325 MG PO TABS: 1.0000 | ORAL_TABLET | ORAL | 0 refills | Status: DC | PRN

## 2018-07-02 NOTE — Discharge Summary (Signed)
Patient ID: Courtney King MRN: [id number] DOB/AGE: 52-Aug-1967 52 y.o.  Admit date: 06/29/2018 Discharge date: 07/02/2018   Discharge Diagnoses:  Active Problems:   Diverticulitis of colon   Procedures: Laparoscopic LAR  Hospital Course: 52 yo female w chronic diverticulitis and colovesical fistula underwent an uneventful lap LAR. Her diet was advance from a clear liquids to a low residue diet which she tolerated it well. Her labs were wnl except her inc in WBC that was trending down and likely elevated due to her recent acute exacerbation. At the time of DC she was ambulating, taking diet, having BM . AVSS. Her PE showed a female in NAD , awake and alert. Abd: soft, incisions c/d/i no infection or peritonitis. Drain w serous output was removed. Ext: no edema and well perfused. Neuro: GCS 15, no motor deficits. Condition at the time of DC was stable.    Disposition: Discharge disposition: 01-Home or Self Care       Discharge Instructions    Call MD for:  difficulty breathing, headache or visual disturbances   Complete by:  As directed    Call MD for:  extreme fatigue   Complete by:  As directed    Call MD for:  hives   Complete by:  As directed    Call MD for:  persistant dizziness or light-headedness   Complete by:  As directed    Call MD for:  persistant nausea and vomiting   Complete by:  As directed    Call MD for:  redness, tenderness, or signs of infection (pain, swelling, redness, odor or green/yellow discharge around incision site)   Complete by:  As directed    Call MD for:  severe uncontrolled pain   Complete by:  As directed    Call MD for:  temperature >100.4   Complete by:  As directed    Diet - low sodium heart healthy   Complete by:  As directed    Discharge instructions   Complete by:  As directed    May shower daily and apply ice packs, May Use OTC Naproxen and tylenol for mild pain   Increase activity slowly   Complete by:  As directed    Lifting  restrictions   Complete by:  As directed    20 lbs x 6 wks   Remove dressing in 24 hours   Complete by:  As directed      Allergies as of 07/02/2018      Reactions   Codeine Itching, Nausea And Vomiting, Nausea Only, Other (See Comments)   Other Reaction: Not Assessed Other Reaction: Not Assessed Other Reaction: Not Assessed Other Reaction: Not Assessed      Medication List    TAKE these medications   aspirin EC 81 MG tablet Take 81 mg by mouth at bedtime.   bisacodyl 5 MG EC tablet Generic drug:  bisacodyl Take 4 tablets as soon as you get prescription.   ciprofloxacin 500 MG tablet Commonly known as:  CIPRO Take 1 tablet (500 mg total) by mouth 2 (two) times daily for 10 days.   clonazePAM 0.5 MG tablet Commonly known as:  KLONOPIN Take 0.5 mg by mouth at bedtime as needed.   erythromycin base 500 MG tablet Commonly known as:  E-MYCIN Take 1 tablet (500 mg total) by mouth 2 (two) times daily. Take 2 tablets as soon as you get prescription and then take 2 tablets 6 hours later.   [place]  U ONCE D UTD   GLUCOCOM BLOOD GLUCOSE MONITOR Devi by Does not apply route.   hydrOXYzine 25 MG tablet Commonly known as:  ATARAX/VISTARIL Take 25 mg by mouth 3 (three) times daily as needed.   linaclotide 145 MCG Caps capsule Commonly known as:  LINZESS Take 1 capsule (145 mcg total) by mouth daily before breakfast.   metFORMIN 500 MG tablet Commonly known as:  GLUCOPHAGE Take 1 tablet (500 mg total) by mouth 2 (two) times daily with a meal.   metroNIDAZOLE 500 MG tablet Commonly known as:  FLAGYL Take 1 tablet (500 mg total) by mouth 3 (three) times daily for 10 days.   neomycin 500 MG tablet Commonly known as:  MYCIFRADIN Take 2 tablets (1,000 mg total) by mouth 2 (two) times daily. Take 2 tablets as soon as you get prescription and then take 2 tablet 6 hours later.   ONETOUCH VERIO test strip Generic drug:  glucose blood Check blood  sugar once daily.   oxyCODONE-acetaminophen 5-325 MG tablet Commonly known as:  PERCOCET/ROXICET Take 1-2 tablets by mouth every 4 (four) hours as needed for severe pain.   UNISTIK 2 NORMAL Misc Check blood sugar daily.   venlafaxine XR 75 MG 24 hr capsule Commonly known as:  EFFEXOR-XR Take 1 capsule (75 mg total) by mouth daily with breakfast.      Follow-up Information    Jules Husbands, MD. Go on 07/12/2018.   Specialty:  General Surgery Why:  Monday August 26th at [facility] for a follow-up                      [address] [place], [place] [address] [phone] Contact information: [place] [address] [phone]            Caroleen Hamman, MD FACS

## 2018-07-02 NOTE — Progress Notes (Signed)
07/02/2018 12:00 PM  Courtney King to be D/C'd Home per MD order.  Discussed prescriptions and follow up appointments with the patient. Prescriptions given to patient, medication list explained in detail. Pt verbalized understanding.  Allergies as of 07/02/2018      Reactions   Codeine Itching, Nausea And Vomiting, Nausea Only, Other (See Comments)   Other Reaction: Not Assessed Other Reaction: Not Assessed Other Reaction: Not Assessed Other Reaction: Not Assessed      Medication List    TAKE these medications   aspirin EC 81 MG tablet Take 81 mg by mouth at bedtime.   bisacodyl 5 MG EC tablet Generic drug:  bisacodyl Take 4 tablets as soon as you get prescription.   ciprofloxacin 500 MG tablet Commonly known as:  CIPRO Take 1 tablet (500 mg total) by mouth 2 (two) times daily for 10 days.   clonazePAM 0.5 MG tablet Commonly known as:  KLONOPIN Take 0.5 mg by mouth at bedtime as needed.   erythromycin base 500 MG tablet Commonly known as:  E-MYCIN Take 1 tablet (500 mg total) by mouth 2 (two) times daily. Take 2 tablets as soon as you get prescription and then take 2 tablets 6 hours later.   FREESTYLE LIBRE 14 DAY READER Devi U ONCE D UTD   GLUCOCOM BLOOD GLUCOSE MONITOR Devi by Does not apply route.   hydrOXYzine 25 MG tablet Commonly known as:  ATARAX/VISTARIL Take 25 mg by mouth 3 (three) times daily as needed.   linaclotide 145 MCG Caps capsule Commonly known as:  LINZESS Take 1 capsule (145 mcg total) by mouth daily before breakfast.   metFORMIN 500 MG tablet Commonly known as:  GLUCOPHAGE Take 1 tablet (500 mg total) by mouth 2 (two) times daily with a meal.   metroNIDAZOLE 500 MG tablet Commonly known as:  FLAGYL Take 1 tablet (500 mg total) by mouth 3 (three) times daily for 10 days.   neomycin 500 MG tablet Commonly known as:  MYCIFRADIN Take 2 tablets (1,000 mg total) by mouth 2 (two) times daily. Take 2 tablets as soon as you get prescription and  then take 2 tablet 6 hours later.   ONETOUCH VERIO test strip Generic drug:  glucose blood Check blood sugar once daily.   oxyCODONE-acetaminophen 5-325 MG tablet Commonly known as:  PERCOCET/ROXICET Take 1-2 tablets by mouth every 4 (four) hours as needed for severe pain.   UNISTIK 2 NORMAL Misc Check blood sugar daily.   venlafaxine XR 75 MG 24 hr capsule Commonly known as:  EFFEXOR-XR Take 1 capsule (75 mg total) by mouth daily with breakfast.       Vitals:   07/01/18 2008 07/02/18 0428  BP: 137/63 139/66  Pulse: 83 77  Resp: 18 20  Temp: 99 F (37.2 C) 98.4 F (36.9 C)  SpO2: 97% 96%    Skin clean, dry and intact without evidence of skin break down, no evidence of skin tears noted. IV catheter discontinued intact. Site without signs and symptoms of complications. Dressing and pressure applied. Pt denies pain at this time. No complaints noted.  An After Visit Summary was printed and given to the patient. Patient escorted via [place], and D/C home via private auto.  Dola Argyle

## 2018-07-02 NOTE — Telephone Encounter (Signed)
Patient's disability form was filled out and faxed to [facility]. Faxed to: [phone]

## 2018-07-02 NOTE — Discharge Instructions (Signed)
Laparoscopic Colectomy Laparoscopic colectomy is surgery to remove part or all of the large intestine (colon). This procedure may be used to treat several conditions, including:  Inflammation and infection of the colon (diverticulitis).  Tumors or masses in the colon.  Inflammatory bowel disease, such as Crohn disease or ulcerative colitis. Colectomy is an option when symptoms cannot be controlled with medicines.  Bleeding from the colon that cannot be controlled by another method.  Blockage or obstruction of the colon.  Tell a health care provider about:  Any allergies you have.  All medicines you are taking, including vitamins, herbs, eye drops, creams, and over-the-counter medicines.  Any problems you or family members have had with anesthetic medicines.  Any blood disorders you have.  Any surgeries you have had.  Any medical conditions you have. What are the risks? Generally, this is a safe procedure. However, problems may occur, including:  Infection.  Bleeding.  Allergic reactions to medicines or dyes.  Damage to other structures or organs.  Leaking from where the colon was sewn together.  Future blockage of the small intestines from scar tissue. Another surgery may be needed to repair this.  Needing to convert to an open procedure. Complications such as damage to other organs or excessive bleeding may require the surgeon to convert from a laparoscopic procedure to an open procedure. This involves making a larger incision in the abdomen.  What happens before the procedure? Staying hydrated Follow instructions from your health care provider about hydration, which may include:  Up to 2 hours before the procedure - you may continue to drink clear liquids, such as water, clear fruit juice, black coffee, and plain tea.  Eating and drinking restrictions Follow instructions from your health care provider about eating and drinking, which may include:  8 hours before  the procedure - stop eating heavy meals, meals with high fiber, or foods such as meat, fried foods, or fatty foods.  6 hours before the procedure - stop eating light meals or foods, such as toast or cereal.  6 hours before the procedure - stop drinking milk or drinks that contain milk.  2 hours before the procedure - stop drinking clear liquids.  Medicines  Ask your health care provider about: ? Changing or stopping your regular medicines. This is especially important if you are taking diabetes medicines or blood thinners. ? Taking medicines such as aspirin and ibuprofen. These medicines can thin your blood. Do not take these medicines before your procedure if your health care provider instructs you not to.  You may be given antibiotic medicine to clean out bacteria from your colon. Follow the directions carefully and take the medicine at the correct time. General instructions  You may be prescribed an oral bowel prep to clean out your colon in preparation for the surgery: ? Follow instructions from your health care provider about how to do this. ? Do not eat or drink anything else after you have started the bowel prep, unless your health care provider tells you it is safe to do so.  Do not use any products that contain nicotine or tobacco, such as cigarettes and e-cigarettes. If you need help quitting, ask your health care provider. What happens during the procedure?  To reduce your risk of infection: ? Your health care team will wash or sanitize their hands. ? Your skin will be washed with soap.  An IV tube will be inserted into one of your veins to deliver fluid and medication.  You   will be given one of the following: ? A medicine to help you relax (sedative). ? A medicine to make you fall asleep (general anesthetic).  Small monitors will be connected to your body. They will be used to check your heart, blood pressure, and oxygen level.  A breathing tube may be placed into your  lungs during the procedure.  A thin, flexible tube (catheter) will be placed into your bladder to drain urine.  A tube may be placed through your nose and into your stomach to drain stomach fluids (nasogastric tube, or NG tube).  Your abdomen will be filled with air so it expands. This gives the surgeon more room to operate and makes your organs easier to see.  Several small cuts (incisions) will be made in your abdomen.  A thin, lighted tube with a tiny camera on the end (laparoscope) will be put through one of the small incisions. The camera on the laparoscope will send a picture to a computer screen in the operating room. This will give the surgeon a good view inside your abdomen.  Hollow tubes will be put through the other small incisions in your abdomen. The tools that are needed for the procedure will be put through these tubes.  Clamps or staples will be put on both ends of the diseased part of the colon.  The part of the intestine between the clamps or staples will be removed.  If possible, the ends of the healthy colon that remain will be stitched (sutured) or stapled together to allow your body to pass waste (stool).  Sometimes, the remaining colon cannot be stitched back together. If this is the case, a colostomy will be needed. If you need a colostomy: ? An opening to the outside of your body (stoma) will be made through your abdomen. ? The end of your colon will be brought to the opening. It will be stitched to the skin. ? A bag will be attached to the opening. Stool will drain into this removable bag. ? The colostomy may be temporary or permanent.  The incisions from the colectomy will be closed with sutures or staples. The procedure may vary among health care providers and hospitals. What happens after the procedure?  Your blood pressure, heart rate, breathing rate, and blood oxygen level will be monitored until the medicines you were given have worn off.  You will  receive fluids through an IV tube until your bowels start to work properly.  Once your bowels are working again, you will be given clear liquids first and then solid food as tolerated.  You will be given medicines to control your pain and nausea, if needed.  Do not drive for 24 hours if you were given a sedative. This information is not intended to replace advice given to you by your health care provider. Make sure you discuss any questions you have with your health care provider. Document Released: 01/24/2003 Document Revised: 08/04/2016 Document Reviewed: 08/04/2016 Elsevier Interactive Patient Education  2018 [employer]    

## 2018-07-05 ENCOUNTER — Telehealth: Payer: Self-pay

## 2018-07-05 NOTE — Telephone Encounter (Signed)
Flagged on EMMI report for not knowing who to reach out to for changes in condition.  Called and spoke with patient.  She reports she does know who to reach out to and that the call did not pick up her answer correctly.  I apologized that she ran into that issue and thanked her for her feedback.  No questions or concerns at this time.  I thanked her for her time and made her aware that she would receive one more automated call checking on her in the next few days.

## 2018-07-07 ENCOUNTER — Inpatient Hospital Stay: Payer: 59 | Admitting: Surgery

## 2018-07-09 ENCOUNTER — Telehealth: Payer: Self-pay | Admitting: Surgery

## 2018-07-09 MED ORDER — FLUCONAZOLE 100 MG PO TABS: 100.0000 mg | ORAL_TABLET | Freq: Every day | ORAL | 0 refills | Status: DC

## 2018-07-09 NOTE — Telephone Encounter (Signed)
Patient is calling asking for some medication, patient believes to have a yeast infection due to all the antibiotics she's been taken. Please call patient and advise.

## 2018-07-09 NOTE — Telephone Encounter (Signed)
Patient stopped cipro and flagyl . Sent Diflucan to pharmacy

## 2018-07-12 ENCOUNTER — Encounter: Payer: Self-pay | Admitting: Emergency Medicine

## 2018-07-12 ENCOUNTER — Emergency Department: Payer: Managed Care, Other (non HMO)

## 2018-07-12 ENCOUNTER — Encounter: Payer: Self-pay | Admitting: Surgery

## 2018-07-12 ENCOUNTER — Emergency Department
Admission: EM | Admit: 2018-07-12 | Discharge: 2018-07-12 | Disposition: A | Discharge status: Home / Self Care | Payer: Managed Care, Other (non HMO) | Attending: Emergency Medicine | Admitting: Emergency Medicine

## 2018-07-12 ENCOUNTER — Ambulatory Visit (INDEPENDENT_AMBULATORY_CARE_PROVIDER_SITE_OTHER): Payer: Managed Care, Other (non HMO) | Admitting: Surgery

## 2018-07-12 VITALS — BP 148/84 | HR 98 | Temp 97.9°F | Wt 162.0 lb

## 2018-07-12 DIAGNOSIS — Z7984 Long term (current) use of oral hypoglycemic drugs: Secondary | ICD-10-CM | POA: Diagnosis not present

## 2018-07-12 DIAGNOSIS — F17210 Nicotine dependence, cigarettes, uncomplicated: Secondary | ICD-10-CM | POA: Diagnosis not present

## 2018-07-12 DIAGNOSIS — I2510 Atherosclerotic heart disease of native coronary artery without angina pectoris: Secondary | ICD-10-CM | POA: Diagnosis not present

## 2018-07-12 DIAGNOSIS — E119 Type 2 diabetes mellitus without complications: Secondary | ICD-10-CM | POA: Diagnosis not present

## 2018-07-12 DIAGNOSIS — Z79899 Other long term (current) drug therapy: Secondary | ICD-10-CM | POA: Insufficient documentation

## 2018-07-12 DIAGNOSIS — Z7982 Long term (current) use of aspirin: Secondary | ICD-10-CM | POA: Diagnosis not present

## 2018-07-12 DIAGNOSIS — I639 Cerebral infarction, unspecified: Secondary | ICD-10-CM | POA: Diagnosis not present

## 2018-07-12 DIAGNOSIS — Z09 Encounter for follow-up examination after completed treatment for conditions other than malignant neoplasm: Secondary | ICD-10-CM

## 2018-07-12 DIAGNOSIS — I10 Essential (primary) hypertension: Secondary | ICD-10-CM | POA: Diagnosis not present

## 2018-07-12 DIAGNOSIS — R413 Other amnesia: Secondary | ICD-10-CM | POA: Diagnosis present

## 2018-07-12 LAB — DIFFERENTIAL
BASOS PCT: 3 %
Basophils Absolute: 0.2 10*3/uL — ABNORMAL HIGH (ref 0–0.1)
EOS PCT: 6 %
Eosinophils Absolute: 0.6 10*3/uL (ref 0–0.7)
Lymphocytes Relative: 33 %
Lymphs Abs: 3.2 10*3/uL (ref 1.0–3.6)
MONO ABS: 0.8 10*3/uL (ref 0.2–0.9)
Monocytes Relative: 8 %
NEUTROS ABS: 5 10*3/uL (ref 1.4–6.5)
NEUTROS PCT: 50 %

## 2018-07-12 LAB — COMPREHENSIVE METABOLIC PANEL
ALBUMIN: 3.8 g/dL (ref 3.5–5.0)
ALT: 14 U/L (ref 0–44)
ANION GAP: 8 (ref 5–15)
AST: 15 U/L (ref 15–41)
Alkaline Phosphatase: 96 U/L (ref 38–126)
BUN: 10 mg/dL (ref 6–20)
CHLORIDE: 105 mmol/L (ref 98–111)
CO2: 28 mmol/L (ref 22–32)
Calcium: 9.1 mg/dL (ref 8.9–10.3)
Creatinine, Ser: 0.71 mg/dL (ref 0.44–1.00)
GFR calc Af Amer: >60 mL/min (ref >60)
GFR calc non Af Amer: >60 mL/min (ref >60)
Glucose, Bld: 94 mg/dL (ref 70–99)
Potassium: 4 mmol/L (ref 3.5–5.1)
SODIUM: 141 mmol/L (ref 135–145)
Total Bilirubin: 0.5 mg/dL (ref 0.3–1.2)
Total Protein: 7.4 g/dL (ref 6.5–8.1)

## 2018-07-12 LAB — CBC
HCT: 38.3 % (ref 35.0–47.0)
Hemoglobin: 13.5 g/dL (ref 12.0–16.0)
MCH: 32 pg (ref 26.0–34.0)
MCHC: 35.1 g/dL (ref 32.0–36.0)
MCV: 91.2 fL (ref 80.0–100.0)
PLATELETS: 588 10*3/uL — AB (ref 150–440)
RBC: 4.2 MIL/uL (ref 3.80–5.20)
RDW: 13.4 % (ref 11.5–14.5)
WBC: 9.8 10*3/uL (ref 3.6–11.0)

## 2018-07-12 LAB — TROPONIN I: Troponin I: <0.03 ng/mL (ref <0.03)

## 2018-07-12 MED ORDER — ASPIRIN 81 MG PO CHEW
324.0000 mg | CHEWABLE_TABLET | Freq: Once | ORAL | Status: AC
  Administered 2018-07-12: 324 mg via ORAL
  Filled 2018-07-12: qty 4

## 2018-07-12 MED ORDER — ASPIRIN EC 325 MG PO TBEC: 325.0000 mg | DELAYED_RELEASE_TABLET | Freq: Every day | ORAL | 0 refills | Status: DC

## 2018-07-12 MED ORDER — ATORVASTATIN CALCIUM 20 MG PO TABS: 20.0000 mg | ORAL_TABLET | Freq: Every day | ORAL | 1 refills | Status: AC

## 2018-07-12 NOTE — ED Notes (Signed)
E-signature pad not working, pt verbalized understanding of discharge instructions.  

## 2018-07-12 NOTE — Discharge Instructions (Addendum)
Your CT scan today shows 2 small areas of stroke on the left side of your brain that are causing your symptoms.  Please follow-up with your doctor today for further evaluation of this stroke, including obtaining ultrasounds of your carotid arteries and heart as well as additional blood work.  In the meantime he should take aspirin 325 milligrams daily and atorvastatin 20 milligrams daily.  Results for orders placed or performed during the hospital encounter of 07/12/18  CBC  Result Value Ref Range   WBC 9.8 3.6 - 11.0 K/uL   RBC 4.20 3.80 - 5.20 MIL/uL   Hemoglobin 13.5 12.0 - 16.0 g/dL   HCT 38.3 35.0 - 47.0 %   MCV 91.2 80.0 - 100.0 fL   MCH 32.0 26.0 - 34.0 pg   MCHC 35.1 32.0 - 36.0 g/dL   RDW 13.4 11.5 - 14.5 %   Platelets 588 (H) 150 - 440 K/uL  Differential  Result Value Ref Range   Neutrophils Relative % 50 %   Neutro Abs 5.0 1.4 - 6.5 K/uL   Lymphocytes Relative 33 %   Lymphs Abs 3.2 1.0 - 3.6 K/uL   Monocytes Relative 8 %   Monocytes Absolute 0.8 0.2 - 0.9 K/uL   Eosinophils Relative 6 %   Eosinophils Absolute 0.6 0 - 0.7 K/uL   Basophils Relative 3 %   Basophils Absolute 0.2 (H) 0 - 0.1 K/uL  Comprehensive metabolic panel  Result Value Ref Range   Sodium 141 135 - 145 mmol/L   Potassium 4.0 3.5 - 5.1 mmol/L   Chloride 105 98 - 111 mmol/L   CO2 28 22 - 32 mmol/L   Glucose, Bld 94 70 - 99 mg/dL   BUN 10 6 - 20 mg/dL   Creatinine, Ser 0.71 0.44 - 1.00 mg/dL   Calcium 9.1 8.9 - 10.3 mg/dL   Total Protein 7.4 6.5 - 8.1 g/dL   Albumin 3.8 3.5 - 5.0 g/dL   AST 15 15 - 41 U/L   ALT 14 0 - 44 U/L   Alkaline Phosphatase 96 38 - 126 U/L   Total Bilirubin 0.5 0.3 - 1.2 mg/dL   GFR calc non Af Amer >60 >60 mL/min   GFR calc Af Amer >60 >60 mL/min   Anion gap 8 5 - 15  Troponin I  Result Value Ref Range   Troponin I <0.03 <0.03 ng/mL   Ct Head Wo Contrast  Result Date: 07/12/2018 CLINICAL DATA:  Memory loss and difficulty with speech for approximately 2 weeks since the  patient underwent sigmoid colectomy 06/29/2018. EXAM: CT HEAD WITHOUT CONTRAST TECHNIQUE: Contiguous axial images were obtained from the base of the skull through the vertex without intravenous contrast. COMPARISON:  Head CT scan 10/24/2017. FINDINGS: Brain: A wedge-shaped area of hypoattenuation is seen in the posterior aspect of the left parietal lobe. A second area of hypoattenuation is also seen in the left frontal lobe. Both these are new since the prior examination and consistent with infarcts, likely acute or early subacute. No hemorrhage, midline shift or hydrocephalus is identified. No pneumocephalus. Vascular: No hyperdense vessel or unexpected calcification. Skull: Intact. Sinuses/Orbits: Negative. Other: None. IMPRESSION: Acute or early subacute appearing infarcts in the left parietal and frontal lobes. Brain MRI with contrast could be used for further evaluation. Electronically Signed   By: Inge Rise M.D.   On: 07/12/2018 11:56   Ct Abdomen Pelvis W Contrast  Result Date: 06/21/2018 CLINICAL DATA:  Progressive left lower quadrant abdominal pain  EXAM: CT ABDOMEN AND PELVIS WITH CONTRAST TECHNIQUE: Multidetector CT imaging of the abdomen and pelvis was performed using the standard protocol following bolus administration of intravenous contrast. CONTRAST:  11mL OMNIPAQUE IOHEXOL 300 MG/ML  SOLN COMPARISON:  CT abdomen pelvis 02/23/2017. FINDINGS: Lower chest: Lung bases are clear without focal nodule, mass, or airspace disease. The heart size is normal. No significant pleural or pericardial effusion is present. Hepatobiliary: There is diffuse fatty infiltration of the liver. There is some sparing laterally in the right lobe. No mass lesion is present. No significant intrahepatic biliary dilation is present. Cholecystectomy is noted. Common bile duct is within normal limits following cholecystectomy. Pancreas: Unremarkable. No pancreatic ductal dilatation or surrounding inflammatory changes.  Spleen: Normal in size without focal abnormality. Adrenals/Urinary Tract: The adrenal glands are normal bilaterally. A punctate non-obstructing stone near the lower pole of the left kidney is stable. No obstructing disease is present. The urinary bladder is within normal limits. Stomach/Bowel: Fundal location is noted at the GE junction. There is mild dilation of the distal esophagus. Stomach is otherwise within normal limits. Duodenal ulcer is present without focal inflammation. Small bowel is within normal limits. Terminal ileum is normal. The appendix is visualized and normal. Ascending transverse colon are normal. Diverticular changes are present in the descending colon. Diffuse inflammatory changes are present within the sigmoid colon compatible with acute diverticulitis. The fluid collection adjacent to the inflamed area measures 1.8 x 1.9 x 1.9 cm. This extends to the vaginal cuff. Of note, there is gas within vagina. There is no gas within the fluid collection. Vascular/Lymphatic: Scattered atherosclerotic changes are present throughout the aorta without aneurysm. No significant adenopathy is present. Reproductive: Status post hysterectomy. No adnexal masses. Gas is noted within the vagina. Other: 10 mm supraumbilical ventral hernia contains fat without bowel. No other significant hernia is present. No significant free fluid is present. Musculoskeletal: Vertebral body heights alignment are maintained. Pelvis is within normal limits. Hips are located and normal. IMPRESSION: 1. Sigmoid diverticulitis complicated by adjacent 1.9 cm abscess. 2. The abscess is adjacent to the vaginal cough. Gas is present in the vagina. This may represent a colorectal fistula. 3. Additional diverticular disease in the descending colon without inflammation. 4. Punctate nonobstructive stone at the lower pole of the left kidney. 5. Hepatic steatosis. 6.  Aortic Atherosclerosis (ICD10-I70.0). These results were called by telephone at  the time of interpretation on 06/21/2018 at 5:21 pm to Dr. Lenise Arena , who verbally acknowledged these results. Electronically Signed   By: San Morelle M.D.   On: 06/21/2018 17:21

## 2018-07-12 NOTE — ED Provider Notes (Signed)
[facility] Emergency Department Provider Note  ____________________________________________  Time seen: Approximately 2:31 PM  I have reviewed the triage vital signs and the nursing notes.   HISTORY  Chief Complaint Memory Loss    HPI DEBANHI BLAKER is a 52 y.o. female with a history of diabetes GERD hypertension hyperlipidemia who sent to the ED from surgery clinic today for evaluation of possible stroke.  Patient reports that she was at her normal baseline state of health when she went in for a surgical procedure 2 weeks ago.  After awakening from the anesthesia, she reports that she has not returned to her normal function, and the symptoms that she is noticing are word finding difficulties, difficulty using a smart phone, and memory loss.  Denies any change in balance or coordination or muscle strength.  No trauma, no vision changes, no fevers chills sweats or neck stiffness.      Past Medical History:  Diagnosis Date  . Allergy   . Anxiety   . Diabetes mellitus without complication ([place])   . GERD (gastroesophageal reflux disease)   . H/O blood clots 2014   in abdominal aorta - endarterectomy at [facility]  . Hyperlipidemia   . Hypertension   . Left leg claudication [facility])      Patient Active Problem List   Diagnosis Date Noted  . Diverticulitis of colon 06/21/2018  . Sigmoid diverticulitis   . Coronary artery disease 03/09/2018  . Diabetes ([place]) 03/09/2018  . Anxiety, generalized 10/20/2016  . H/O carotid endarterectomy 10/20/2016  . Hyperlipidemia, mixed 10/20/2016  . Pruritic erythematous rash 10/20/2016  . Dependence on nicotine from other tobacco product 07/11/2016  . Tobacco abuse 07/11/2016  . Family history of colon cancer 12/07/2015  . Family history of ovarian cancer 12/07/2015  . Prediabetes 12/04/2015  . Hx of endarterectomy 11/02/2015  . Essential hypertension 11/02/2015  . Hypertriglyceridemia 09/03/2015  . Adiposity 09/03/2015   . BMI 29.0-29.9,adult 07/17/2015  . Anxiety 07/17/2015  . GERD (gastroesophageal reflux disease) 07/17/2015  . Hot flashes, menopausal 07/17/2015     Past Surgical History:  Procedure Laterality Date  . ABDOMINAL HYSTERECTOMY  2004  . AORTIC ENDARTERECETOMY  2013  . CHOLECYSTECTOMY  1999  . COLONOSCOPY WITH PROPOFOL N/A 04/03/2017   Procedure: COLONOSCOPY WITH PROPOFOL;  Surgeon: Jonathon Bellows, MD;  Location: [facility] ENDOSCOPY;  Service: Endoscopy;  Laterality: N/A;  . ESOPHAGOGASTRODUODENOSCOPY (EGD) WITH PROPOFOL N/A 04/03/2017   Procedure: ESOPHAGOGASTRODUODENOSCOPY (EGD) WITH PROPOFOL;  Surgeon: Jonathon Bellows, MD;  Location[facility] ENDOSCOPY;  Service: Endoscopy;  Laterality: N/A;  . EUS N/A 04/30/2017   Procedure: FULL UPPER ENDOSCOPIC ULTRASOUND (EUS) RADIAL;  Surgeon: Jola Schmidt, MD;  Location: [facility] ENDOSCOPY;  Service: Endoscopy;  Laterality: N/A;  . FOOT SURGERY Left    plantar fasciitis  . LAPAROSCOPIC NISSEN FUNDOPLICATION  1610   done in [place] to eliminate acid reflux  . LAPAROSCOPIC SIGMOID COLECTOMY N/A 06/29/2018   Procedure: LAPAROSCOPIC SIGMOID COLECTOMY;  Surgeon: Jules Husbands, MD;  Location: [facility] ORS;  Service: General;  Laterality: N/A;  . OOPHORECTOMY Bilateral 2004     Prior to Admission medications   Medication Sig Start Date End Date Taking? Authorizing Provider  aspirin EC 325 MG tablet Take 1 tablet (325 mg total) by mouth daily. 07/12/18   Carrie Mew, MD  atorvastatin (LIPITOR) 20 MG tablet Take 1 tablet (20 mg total) by mouth daily. 07/12/18 09/10/18  Carrie Mew, MD  BISACODYL 5 MG EC tablet Take 4 tablets as soon  as you get prescription. 06/28/18   Pabon, Diego F, MD  Blood Glucose Monitoring Suppl (GLUCOCOM BLOOD GLUCOSE MONITOR) DEVI by Does not apply route. 03/12/16   [provider]  clonazePAM (KLONOPIN) 0.5 MG tablet Take 0.5 mg by mouth at bedtime as needed.     [provider]  Continuous Blood Gluc Receiver (FREESTYLE  LIBRE 14 DAY READER) DEVI U ONCE D UTD 01/19/18   [provider]  erythromycin base (E-MYCIN) 500 MG tablet Take 1 tablet (500 mg total) by mouth 2 (two) times daily. Take 2 tablets as soon as you get prescription and then take 2 tablets 6 hours later. 06/28/18   Pabon, [place], MD  fluconazole (DIFLUCAN) 100 MG tablet Take 1 tablet (100 mg total) by mouth daily. 07/09/18   Pabon, Diego F, MD  glucose blood (ONETOUCH VERIO) test strip Check blood sugar once daily. 03/12/16   [provider]  hydrOXYzine (ATARAX/VISTARIL) 25 MG tablet Take 25 mg by mouth 3 (three) times daily as needed.    [provider]  Lancets Misc. (UNISTIK 2 NORMAL) MISC Check blood sugar daily. 03/12/16   [provider]  linaclotide Rolan Lipa) 145 MCG CAPS capsule Take 1 capsule (145 mcg total) by mouth daily before breakfast. 12/15/17 06/21/18  Jonathon Bellows, MD  metFORMIN (GLUCOPHAGE) 500 MG tablet Take 1 tablet (500 mg total) by mouth 2 (two) times daily with a meal. 03/07/16   Krebs, Genevie Cheshire, NP  neomycin (MYCIFRADIN) 500 MG tablet Take 2 tablets (1,000 mg total) by mouth 2 (two) times daily. Take 2 tablets as soon as you get prescription and then take 2 tablet 6 hours later. 06/28/18   Pabon, Marjory Lies, MD  venlafaxine XR (EFFEXOR-XR) 75 MG 24 hr capsule Take 1 capsule (75 mg total) by mouth daily with breakfast. 03/07/16   Luciana Axe, NP     Allergies Codeine   Family History  Problem Relation Age of Onset  . Cancer Mother        ovarian  . Cancer Maternal Aunt        ovarian    Social History Social History   Tobacco Use  . Smoking status: Current Every Day Smoker    Packs/day: 1.00    Years: 20.00    Pack years: 20.00  . Smokeless tobacco: Current User  Substance Use Topics  . Alcohol use: No    Alcohol/week: 0.0 standard drinks  . Drug use: No    Review of Systems  Constitutional:   No fever or chills.  ENT:   No sore throat. No rhinorrhea. Cardiovascular:   No  chest pain or syncope. Respiratory:   No dyspnea or cough. Gastrointestinal:   Negative for abdominal pain, vomiting and diarrhea.  Musculoskeletal:   Negative for focal pain or swelling All other systems reviewed and are negative except as documented above in ROS and HPI.  ____________________________________________   PHYSICAL EXAM:  VITAL SIGNS: ED Triage Vitals  Enc Vitals Group     BP 07/12/18 1117 (!) 147/66     Pulse Rate 07/12/18 1117 98     Resp 07/12/18 1117 20     Temp 07/12/18 1117 98.6 F (37 C)     Temp Source 07/12/18 1117 Oral     SpO2 07/12/18 1117 100 %     Weight 07/12/18 1118 162 lb (73.5 kg)     Height 07/12/18 1118 5\' 2"  (1.575 m)     Head Circumference --  Peak Flow --      Pain Score 07/12/18 1118 0     Pain Loc --      Pain Edu? --      Excl. in [place]? --     Vital signs reviewed, nursing assessments reviewed.   Constitutional:   Alert and oriented. Non-toxic appearance. Eyes:   Conjunctivae are normal. EOMI. PERRL. ENT      Head:   Normocephalic and atraumatic.      Nose:   No congestion/rhinnorhea.       Mouth/Throat:   MMM, no pharyngeal erythema. No peritonsillar mass.       Neck:   No meningismus. Full ROM.  No carotid bruit Hematological/Lymphatic/Immunilogical:   No cervical lymphadenopathy. Cardiovascular:   RRR. Symmetric bilateral radial and DP pulses.  No murmurs. Cap refill less than 2 seconds. Respiratory:   Normal respiratory effort without tachypnea/retractions. Breath sounds are clear and equal bilaterally. No wheezes/rales/rhonchi. Gastrointestinal:   Soft and nontender. Non distended. There is no CVA tenderness.  No rebound, rigidity, or guarding. Musculoskeletal:   Normal range of motion in all extremities. No joint effusions.  No lower extremity tenderness.  No edema. Neurologic:   Slight dysarthria.  Some word finding difficulty and hesitations when arranging her sentences. Motor grossly intact.  No pronator drift.  Normal  gait.  Normal finger-to-nose NIH stroke scale 2 Skin:    Skin is warm, dry and intact. No rash noted.  No petechiae, purpura, or bullae.  ____________________________________________    LABS (pertinent positives/negatives) (all labs ordered are listed, but only abnormal results are displayed) Labs Reviewed  CBC - Abnormal; Notable for the following components:      Result Value   Platelets 588 (*)    All other components within normal limits  DIFFERENTIAL - Abnormal; Notable for the following components:   Basophils Absolute 0.2 (*)    All other components within normal limits  COMPREHENSIVE METABOLIC PANEL  TROPONIN I  PROTIME-INR  APTT  CBG MONITORING, ED  POC URINE PREG, ED   ____________________________________________   EKG  Interpreted by me  Date: 07/12/2018  Rate: 86  Rhythm: normal sinus rhythm  QRS Axis: normal  Intervals: normal  ST/T Wave abnormalities: normal  Conduction Disutrbances: none  Narrative Interpretation: unremarkable      ____________________________________________    RADIOLOGY  Ct Head Wo Contrast  Result Date: 07/12/2018 CLINICAL DATA:  Memory loss and difficulty with speech for approximately 2 weeks since the patient underwent sigmoid colectomy 06/29/2018. EXAM: CT HEAD WITHOUT CONTRAST TECHNIQUE: Contiguous axial images were obtained from the base of the skull through the vertex without intravenous contrast. COMPARISON:  Head CT scan 10/24/2017. FINDINGS: Brain: A wedge-shaped area of hypoattenuation is seen in the posterior aspect of the left parietal lobe. A second area of hypoattenuation is also seen in the left frontal lobe. Both these are new since the prior examination and consistent with infarcts, likely acute or early subacute. No hemorrhage, midline shift or hydrocephalus is identified. No pneumocephalus. Vascular: No hyperdense vessel or unexpected calcification. Skull: Intact. Sinuses/Orbits: Negative. Other: None. IMPRESSION:  Acute or early subacute appearing infarcts in the left parietal and frontal lobes. Brain MRI with contrast could be used for further evaluation. Electronically Signed   By: Inge Rise M.D.   On: 07/12/2018 11:56    ____________________________________________   PROCEDURES Procedures  ____________________________________________  DIFFERENTIAL DIAGNOSIS   Ischemic stroke  CLINICAL IMPRESSION / ASSESSMENT AND PLAN / ED COURSE  Pertinent labs &  imaging results that were available during my care of the patient were reviewed by me and considered in my medical decision making (see chart for details).    Patient presents with word finding difficulty and memory loss, started suddenly about 2 weeks ago after a surgical procedure with general anesthesia according to the patient.  Physical exam is significant for a degree of aphasia and slurred speech, NIH stroke scale is 2.  Vital signs and labs are unremarkable, no motor involvement.  No airway risk.  CT scan shows 2 areas of ischemic infarct in the left hemisphere which explains her symptoms.  They are subacute in appearance according to radiology.  Due to these findings, I recommended hospitalization for expedited stroke work-up.  However, the patient does not want to stay in the hospital and much prefers outpatient management.  She has contacted her primary care clinic who can get her seen today.  I discussed with neurology who recommends full dose aspirin and low-dose statin daily while outpatient work-up is in progress.  Given that patient has had the symptoms for 2 weeks without any deterioration or progressive neurologic deficits, it is not unreasonable to proceed outpatient.  She does have medical decision-making capacity.  No evidence of meningitis encephalitis or intracranial hemorrhage.  No evidence of atrial fibrillation at this time.      ____________________________________________   FINAL CLINICAL IMPRESSION(S) / ED  DIAGNOSES    Final diagnoses:  Ischemic stroke [facility])     ED Discharge Orders         Ordered    aspirin EC 325 MG tablet  Daily     07/12/18 1428    atorvastatin (LIPITOR) 20 MG tablet  Daily     07/12/18 1428          Portions of this note were generated with dragon dictation software. Dictation errors may occur despite best attempts at proofreading.    Carrie Mew, MD 07/12/18 1436

## 2018-07-12 NOTE — Progress Notes (Signed)
S/p lap sigmoid colectomy 8/13 Taking PO, having BM Some abdominal pain Most concerning is her MS changes Family reports behavioral changes over the last week, memory loss Issues with coordination ( while eating) Having BM, no fevers  PE NAD She is definitely changed from her Mental status perspective,. She answers questions but has difficulty findings words and there is brady psychic. No focal deficits Abd: soft, Appropriate minimal incisional tenderness, staples removed. No infection or peritonitis  A/P Concerning for stroke given new high cognitive changes D/W Husband and advice him to take her to the ER for stroke w/u She does not seem to have any surgical complications at this time

## 2018-07-12 NOTE — Patient Instructions (Signed)
Please go to the ED to be evaluated ASAP.

## 2018-07-12 NOTE — ED Triage Notes (Signed)
Pt reports went to MD today for a wound recheck from colon surgery and was advised to come to the ED for a head CT to rule out a stroke. Face is symmetrical, no drooping noted. Pt reports her MD sent her over here due to memory loss. Pt A&O x's 4

## 2018-07-12 NOTE — ED Triage Notes (Signed)
Pt reports her memory loss has been since she had surgery around 8/13.

## 2018-07-13 ENCOUNTER — Telehealth: Payer: Self-pay | Admitting: *Deleted

## 2018-07-13 NOTE — Telephone Encounter (Signed)
Patient called and stated that she was here yesterday and was told that she can not return back to work until September 13th. Patient wanted to know if you have extended it through her FMLA

## 2018-07-14 ENCOUNTER — Telehealth: Payer: Self-pay

## 2018-07-14 NOTE — Telephone Encounter (Signed)
Patient is going to get FMLA form sent to office for [place] to fill out. May return back to work on 07/30/2018.

## 2018-07-14 NOTE — Telephone Encounter (Signed)
Called patient to let her know that her disability forms were filled out and faxed to [place]. Patient was notified.

## 2018-07-16 ENCOUNTER — Other Ambulatory Visit: Payer: Self-pay | Admitting: Family Medicine

## 2018-07-16 DIAGNOSIS — I639 Cerebral infarction, unspecified: Secondary | ICD-10-CM

## 2018-07-22 ENCOUNTER — Ambulatory Visit
Admission: RE | Admit: 2018-07-22 | Discharge: 2018-07-22 | Disposition: A | Discharge status: Home / Self Care | Payer: Managed Care, Other (non HMO) | Source: Ambulatory Visit | Attending: Family Medicine | Admitting: Family Medicine

## 2018-07-22 DIAGNOSIS — I6522 Occlusion and stenosis of left carotid artery: Secondary | ICD-10-CM | POA: Insufficient documentation

## 2018-07-22 DIAGNOSIS — I639 Cerebral infarction, unspecified: Secondary | ICD-10-CM

## 2018-07-29 ENCOUNTER — Ambulatory Visit: Payer: Managed Care, Other (non HMO) | Attending: Family Medicine | Admitting: Speech Pathology

## 2018-07-29 DIAGNOSIS — R41841 Cognitive communication deficit: Secondary | ICD-10-CM | POA: Diagnosis present

## 2018-07-29 DIAGNOSIS — R278 Other lack of coordination: Secondary | ICD-10-CM | POA: Insufficient documentation

## 2018-07-29 DIAGNOSIS — M6281 Muscle weakness (generalized): Secondary | ICD-10-CM | POA: Diagnosis present

## 2018-07-29 DIAGNOSIS — R4701 Aphasia: Secondary | ICD-10-CM

## 2018-07-29 DIAGNOSIS — H547 Unspecified visual loss: Secondary | ICD-10-CM | POA: Insufficient documentation

## 2018-07-29 DIAGNOSIS — I69190 Apraxia following nontraumatic intracerebral hemorrhage: Secondary | ICD-10-CM | POA: Insufficient documentation

## 2018-07-30 ENCOUNTER — Other Ambulatory Visit: Payer: Self-pay

## 2018-07-30 ENCOUNTER — Encounter: Payer: Self-pay | Admitting: Speech Pathology

## 2018-07-30 NOTE — Therapy (Signed)
[place] MAIN [facility] SERVICES [address] [place], [place], [address] Phone: [phone]   Fax:  [phone]  Speech Language Pathology Evaluation  Patient Details  Name: Courtney King MRN: [id number] Date of Birth: September 03, 1966 Referring Provider: Luciana Axe    Encounter Date: 07/29/2018  End of Session - 07/30/18 1556    Visit Number  1    Number of Visits  17    Date for SLP Re-Evaluation  09/28/18    SLP Start Time  1500    SLP Stop Time   1558    SLP Time Calculation (min)  58 min    Activity Tolerance  Patient tolerated treatment well       Past Medical History:  Diagnosis Date  . Allergy   . Anxiety   . Diabetes mellitus without complication ([place])   . GERD (gastroesophageal reflux disease)   . H/O blood clots 2014   in abdominal aorta - endarterectomy at [facility]  . Hyperlipidemia   . Hypertension   . Left leg claudication [facility])     Past Surgical History:  Procedure Laterality Date  . ABDOMINAL HYSTERECTOMY  2004  . AORTIC ENDARTERECETOMY  2013  . CHOLECYSTECTOMY  1999  . COLONOSCOPY WITH PROPOFOL N/A 04/03/2017   Procedure: COLONOSCOPY WITH PROPOFOL;  Surgeon: Jonathon Bellows, MD;  Location: [facility] ENDOSCOPY;  Service: Endoscopy;  Laterality: N/A;  . ESOPHAGOGASTRODUODENOSCOPY (EGD) WITH PROPOFOL N/A 04/03/2017   Procedure: ESOPHAGOGASTRODUODENOSCOPY (EGD) WITH PROPOFOL;  Surgeon: Jonathon Bellows, MD;  Location: [facility] ENDOSCOPY;  Service: Endoscopy;  Laterality: N/A;  . EUS N/A 04/30/2017   Procedure: FULL UPPER ENDOSCOPIC ULTRASOUND (EUS) RADIAL;  Surgeon: Jola Schmidt, MD;  Location: [facility] ENDOSCOPY;  Service: Endoscopy;  Laterality: N/A;  . FOOT SURGERY Left    plantar fasciitis  . LAPAROSCOPIC NISSEN FUNDOPLICATION  3220   done in [place] to eliminate acid reflux  . LAPAROSCOPIC SIGMOID COLECTOMY N/A 06/29/2018   Procedure: LAPAROSCOPIC SIGMOID COLECTOMY;  Surgeon: Jules Husbands, MD;  Location: [facility] ORS;  Service:  General;  Laterality: N/A;  . OOPHORECTOMY Bilateral 2004    There were no vitals filed for this visit.      SLP Evaluation [facility] - 07/30/18 0001      SLP Visit Information   SLP Received On  07/29/18    Referring Provider  KREBS, AMY LAUREN     Onset Date  07/12/2018    Medical Diagnosis  . 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 female: presents for f/u stroke. 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 Deficits began after surgery on 06/29/2018 but were thought to be 2/2 anesthesia. Pt has followed with neurology. Has appt with ophthamology for visual field deficits this week. Awaiting OT/ Speech eval for aphasia- slow and difficult speech since 8/13.      Prior Functional Status   Cognitive/Linguistic Baseline  Within functional limits      Auditory Comprehension   Overall Auditory Comprehension  Impaired    Overall Auditory Comprehension Comments  Reduced for abstract/complex      Verbal Expression   Overall Verbal Expression  Impaired      Written Expression   Written Expression  Not tested      Oral Motor/Sensory Function   Overall Oral Motor/Sensory Function  Appears within functional limits for tasks assessed      Motor Speech   Overall Motor Speech  Impaired    Motor Planning  --      Standardized Assessments   Standardized Assessments   Western Aphasia Battery revised       Western Aphasia Battery- Revised  Spontaneous Speech      Information content  10/10       Fluency   9/10     Comprehension     Yes/No questions  60/60        Auditory Word Recognition 58/60        Sequential Commands 67/80    Repetition   92/100     Naming    Object Naming  60/60        Word Fluency   8/20         Sentence Completion 10/10        Responsive Speech   10/10     Aphasia Quotient  92.5/100   SLP Education - 07/30/18 1556    Education Details  Plan of care    Person(s) Educated  Patient    Methods  Explanation    Comprehension  Verbalized understanding         SLP Long Term Goals - 07/30/18 1600      SLP LONG TERM GOAL #1   Title  Patient will generate grammatical, fluent, and cogent sentences to complete abstract/complex linguistic task with 80% accuracy.    Time  8    Period  Weeks    Status  New    Target Date  09/28/18      SLP LONG TERM GOAL #2   Title  Patient will complete high level word finding tasks with 80% accuracy.    Time  8    Period  Weeks    Status  New    Target Date  09/28/18      SLP LONG TERM GOAL #3   Title  Patient will write grammatical and cogent sentence to complete abstract/complex linguistic task with 80% accuracy.    Time  8    Period  Weeks    Status  New    Target Date  09/28/18      SLP LONG TERM GOAL #4   Title  Patient will demonstrate reading comprehension for paragraphs with 80% accuracy.     Time  8    Period  Weeks    Status  New    Target Date  09/28/18       Plan - 07/30/18 1558    Clinical Impression Statement  At 2  weeks post onset of ischemic parietal and frontal stroke (07/12/2018), the patient is presenting with mild aphasia characterized by reduced fluency of speech, word finding deficits, and slowed processing of information.  At this point, it is difficult to determine the extent of motor speech difficulty vs. aphasia as primary cause of reduced fluency / rate of speech and cognitive impairment vs. aphasia as primary cause of slowed processing.  The patient would benefit from skilled speech therapy for restorative and compensatory treatment of speech, language, and cognitive deficits.     Speech Therapy Frequency  2x / week    Duration  Other (comment)   8 weeks   Treatment/Interventions  Language facilitation;SLP  instruction and feedback;Compensatory techniques;Cognitive reorganization;Patient/family education    Potential to Achieve Goals  Good    Potential Considerations  Ability to learn/carryover information;Pain level;Family/community support;Co-morbidities;Previous level of function;Cooperation/participation level;Severity of impairments;Medical prognosis    Consulted and Agree with Plan of Care  Patient       Patient will benefit from skilled therapeutic intervention in order to improve the following deficits and impairments:   Aphasia - Plan: SLP plan of care cert/re-cert  Cognitive communication deficit - Plan: SLP plan of care cert/re-cert    Problem List Patient Active Problem List   Diagnosis Date Noted  . Diverticulitis of colon 06/21/2018  . Sigmoid diverticulitis   . Coronary artery disease 03/09/2018  . Diabetes ([place]) 03/09/2018  . Anxiety, generalized 10/20/2016  . H/O carotid endarterectomy 10/20/2016  . Hyperlipidemia, mixed 10/20/2016  . Pruritic erythematous rash 10/20/2016  . Dependence on nicotine from other tobacco product 07/11/2016  . Tobacco abuse 07/11/2016  . Family history of colon cancer 12/07/2015  . Family history of ovarian cancer 12/07/2015  . Prediabetes 12/04/2015  . Hx of endarterectomy 11/02/2015  . Essential hypertension 11/02/2015  . Hypertriglyceridemia 09/03/2015  . Adiposity 09/03/2015  . BMI 29.0-29.9,adult 07/17/2015  . Anxiety 07/17/2015  . GERD (gastroesophageal reflux disease) 07/17/2015  . Hot flashes, menopausal 07/17/2015   Leroy Sea, MS/CCC- SLP  Lou Miner 07/30/2018, 4:05 PM  [place] MAIN [facility] SERVICES [address] [place], [place], [address] Phone: [phone]   Fax:  [phone]  Name: AMOS GABER MRN: [id number] Date of Birth: 1966/08/24

## 2018-08-03 ENCOUNTER — Ambulatory Visit: Payer: Managed Care, Other (non HMO) | Admitting: Speech Pathology

## 2018-08-03 DIAGNOSIS — R4701 Aphasia: Secondary | ICD-10-CM | POA: Diagnosis not present

## 2018-08-03 DIAGNOSIS — I69190 Apraxia following nontraumatic intracerebral hemorrhage: Secondary | ICD-10-CM

## 2018-08-04 ENCOUNTER — Encounter: Payer: Self-pay | Admitting: Speech Pathology

## 2018-08-04 NOTE — Therapy (Signed)
[place] MAIN [facility] SERVICES [address] [place], [place], [address] Phone: [phone]   Fax:  [phone]  Speech Language Pathology Treatment  Patient Details  Name: Courtney King MRN: [id number] Date of Birth: 06/04/1966 Referring Provider: Luciana Axe    Encounter Date: 08/03/2018  End of Session - 08/04/18 1144    Visit Number  2    Number of Visits  17    Date for SLP Re-Evaluation  09/28/18    SLP Start Time  1600    SLP Stop Time   1655    SLP Time Calculation (min)  55 min    Activity Tolerance  Patient tolerated treatment well       Past Medical History:  Diagnosis Date  . Allergy   . Anxiety   . Diabetes mellitus without complication ([place])   . GERD (gastroesophageal reflux disease)   . H/O blood clots 2014   in abdominal aorta - endarterectomy at [facility]  . Hyperlipidemia   . Hypertension   . Left leg claudication [facility])     Past Surgical History:  Procedure Laterality Date  . ABDOMINAL HYSTERECTOMY  2004  . AORTIC ENDARTERECETOMY  2013  . CHOLECYSTECTOMY  1999  . COLONOSCOPY WITH PROPOFOL N/A 04/03/2017   Procedure: COLONOSCOPY WITH PROPOFOL;  Surgeon: Jonathon Bellows, MD;  Location: [facility] ENDOSCOPY;  Service: Endoscopy;  Laterality: N/A;  . ESOPHAGOGASTRODUODENOSCOPY (EGD) WITH PROPOFOL N/A 04/03/2017   Procedure: ESOPHAGOGASTRODUODENOSCOPY (EGD) WITH PROPOFOL;  Surgeon: Jonathon Bellows, MD;  Location: [facility] ENDOSCOPY;  Service: Endoscopy;  Laterality: N/A;  . EUS N/A 04/30/2017   Procedure: FULL UPPER ENDOSCOPIC ULTRASOUND (EUS) RADIAL;  Surgeon: Jola Schmidt, MD;  Location: [facility] ENDOSCOPY;  Service: Endoscopy;  Laterality: N/A;  . FOOT SURGERY Left    plantar fasciitis  . LAPAROSCOPIC NISSEN FUNDOPLICATION  3818   done in [place] to eliminate acid reflux  . LAPAROSCOPIC SIGMOID COLECTOMY N/A 06/29/2018   Procedure: LAPAROSCOPIC SIGMOID COLECTOMY;  Surgeon: Jules Husbands, MD;  Location: [facility] ORS;  Service:  General;  Laterality: N/A;  . OOPHORECTOMY Bilateral 2004    There were no vitals filed for this visit.  Subjective Assessment - 08/04/18 1144    Subjective  "I'm about the same"            ADULT SLP TREATMENT - 08/04/18 0001      General Information   Behavior/Cognition  Alert;Cooperative;Pleasant mood    HPI  Courtney King is a 52 y.o. female: presents for f/u stroke. 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 Deficits began after surgery on 06/29/2018 but were thought to be 2/2 anesthesia. Pt has followed with neurology. Has appt with ophthamology for visual field deficits this week. Awaiting OT/ Speech eval for aphasia- slow and difficult speech since 8/13.       Treatment Provided   Treatment provided  Cognitive-Linquistic      Pain Assessment   Pain Assessment  No/denies pain      Cognitive-Linquistic Treatment   Treatment focused on  Apraxia;Aphasia    Skilled Treatment  Patient able to read and complete simple verbal analogies with 95% accuracy.  Patient with moderate dysfluency characterized by prolongations and pauses.  The patient was able to maintain improved fluency across words and sentences with cues to allow slight prolongations/pauses, to project with constant airflow, and use intonation to elicit fluency.      Assessment / Recommendations / Plan   Plan  Continue with current  plan of care      Progression Toward Goals   Progression toward goals  Progressing toward goals       SLP Education - 08/04/18 1144    Education Details  Fluency strategies    Person(s) Educated  Patient    Methods  Explanation    Comprehension  Verbalized understanding         SLP Long Term Goals - 07/30/18 1600      SLP LONG TERM GOAL #1   Title  Patient will generate grammatical, fluent, and cogent sentences to complete abstract/complex linguistic task with 80% accuracy.    Time  8    Period  Weeks    Status  New    Target Date  09/28/18      SLP  LONG TERM GOAL #2   Title  Patient will complete high level word finding tasks with 80% accuracy.    Time  8    Period  Weeks    Status  New    Target Date  09/28/18      SLP LONG TERM GOAL #3   Title  Patient will write grammatical and cogent sentence to complete abstract/complex linguistic task with 80% accuracy.    Time  8    Period  Weeks    Status  New    Target Date  09/28/18      SLP LONG TERM GOAL #4   Title  Patient will demonstrate reading comprehension for paragraphs with 80% accuracy.     Time  8    Period  Weeks    Status  New    Target Date  09/28/18       Plan - 08/04/18 1145    Clinical Impression Statement  The patient is most concerned, at this point, about her fluency.  She responded well to fluency cues and was much more fluent for reading sentences and generating sentences after discussion of cues.    Speech Therapy Frequency  2x / week    Duration  Other (comment)    Treatment/Interventions  Language facilitation;SLP instruction and feedback;Compensatory techniques;Cognitive reorganization;Patient/family education    Potential to Achieve Goals  Good    Potential Considerations  Ability to learn/carryover information;Pain level;Family/community support;Co-morbidities;Previous level of function;Cooperation/participation level;Severity of impairments;Medical prognosis    SLP Home Exercise Plan  fluency HEP    Consulted and Agree with Plan of Care  Patient       Patient will benefit from skilled therapeutic intervention in order to improve the following deficits and impairments:   Apraxia following nontraumatic intracerebral hemorrhage  Aphasia    Problem List Patient Active Problem List   Diagnosis Date Noted  . Diverticulitis of colon 06/21/2018  . Sigmoid diverticulitis   . Coronary artery disease 03/09/2018  . Diabetes ([place]) 03/09/2018  . Anxiety, generalized 10/20/2016  . H/O carotid endarterectomy 10/20/2016  . Hyperlipidemia, mixed  10/20/2016  . Pruritic erythematous rash 10/20/2016  . Dependence on nicotine from other tobacco product 07/11/2016  . Tobacco abuse 07/11/2016  . Family history of colon cancer 12/07/2015  . Family history of ovarian cancer 12/07/2015  . Prediabetes 12/04/2015  . Hx of endarterectomy 11/02/2015  . Essential hypertension 11/02/2015  . Hypertriglyceridemia 09/03/2015  . Adiposity 09/03/2015  . BMI 29.0-29.9,adult 07/17/2015  . Anxiety 07/17/2015  . GERD (gastroesophageal reflux disease) 07/17/2015  . Hot flashes, menopausal 07/17/2015   Leroy Sea, MS/CCC- SLP  Lou Miner 08/04/2018, 11:46 AM  [place]  MAIN [facility] SERVICES [place], [place], [address] Phone: [phone]   Fax:  [phone]   Name: Courtney King MRN: [id number] Date of Birth: [birth year]

## 2018-08-05 ENCOUNTER — Ambulatory Visit: Payer: Managed Care, Other (non HMO) | Admitting: Speech Pathology

## 2018-08-05 DIAGNOSIS — I69190 Apraxia following nontraumatic intracerebral hemorrhage: Secondary | ICD-10-CM

## 2018-08-05 DIAGNOSIS — R4701 Aphasia: Secondary | ICD-10-CM | POA: Diagnosis not present

## 2018-08-05 DIAGNOSIS — R41841 Cognitive communication deficit: Secondary | ICD-10-CM

## 2018-08-06 ENCOUNTER — Encounter: Payer: Self-pay | Admitting: Speech Pathology

## 2018-08-06 NOTE — Therapy (Signed)
[place] MAIN [facility] SERVICES [address] [place], [place], [address] Phone: [phone]   Fax:  [phone]  Speech Language Pathology Treatment  Patient Details  Name: Courtney King MRN: [id number] Date of Birth: [birth year] Referring Provider: Luciana Axe    Encounter Date: 08/05/2018  End of Session - 08/06/18 1500    Visit Number  3    Number of Visits  17    Date for SLP Re-Evaluation  09/28/18    SLP Start Time  1600    SLP Stop Time   1650    SLP Time Calculation (min)  50 min    Activity Tolerance  Patient tolerated treatment well       Past Medical History:  Diagnosis Date  . Allergy   . Anxiety   . Diabetes mellitus without complication ([place])   . GERD (gastroesophageal reflux disease)   . H/O blood clots 2014   in abdominal aorta - endarterectomy at [facility]  . Hyperlipidemia   . Hypertension   . Left leg claudication [facility])     Past Surgical History:  Procedure Laterality Date  . ABDOMINAL HYSTERECTOMY  2004  . AORTIC ENDARTERECETOMY  2013  . CHOLECYSTECTOMY  1999  . COLONOSCOPY WITH PROPOFOL N/A 04/03/2017   Procedure: COLONOSCOPY WITH PROPOFOL;  Surgeon: Jonathon Bellows, MD;  Location: [facility] ENDOSCOPY;  Service: Endoscopy;  Laterality: N/A;  . ESOPHAGOGASTRODUODENOSCOPY (EGD) WITH PROPOFOL N/A 04/03/2017   Procedure: ESOPHAGOGASTRODUODENOSCOPY (EGD) WITH PROPOFOL;  Surgeon: Jonathon Bellows, MD;  Location: [facility] ENDOSCOPY;  Service: Endoscopy;  Laterality: N/A;  . EUS N/A 04/30/2017   Procedure: FULL UPPER ENDOSCOPIC ULTRASOUND (EUS) RADIAL;  Surgeon: Jola Schmidt, MD;  Location: [facility] ENDOSCOPY;  Service: Endoscopy;  Laterality: N/A;  . FOOT SURGERY Left    plantar fasciitis  . LAPAROSCOPIC NISSEN FUNDOPLICATION  7591   done in [place] to eliminate acid reflux  . LAPAROSCOPIC SIGMOID COLECTOMY N/A 06/29/2018   Procedure: LAPAROSCOPIC SIGMOID COLECTOMY;  Surgeon: Jules Husbands, MD;  Location: [facility] ORS;  Service:  General;  Laterality: N/A;  . OOPHORECTOMY Bilateral 2004    There were no vitals filed for this visit.  Subjective Assessment - 08/06/18 1459    Subjective  The patient demonstrates a brighter affect today            ADULT SLP TREATMENT - 08/06/18 0001      General Information   Behavior/Cognition  Alert;Cooperative;Pleasant mood    HPI  Courtney King is a 52 y.o. female: presents for f/u stroke. 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 Deficits began after surgery on 06/29/2018 but were thought to be 2/2 anesthesia. Pt has followed with neurology. Has appt with ophthamology for visual field deficits this week. Awaiting OT/ Speech eval for aphasia- slow and difficult speech since 8/13.       Treatment Provided   Treatment provided  Cognitive-Linquistic      Pain Assessment   Pain Assessment  No/denies pain      Cognitive-Linquistic Treatment   Treatment focused on  Apraxia;Aphasia    Skilled Treatment  The patient was much more fluent today and in better spirits.        Assessment / Recommendations / Plan   Plan  Continue with current plan of care      Progression Toward Goals   Progression toward goals  Progressing toward goals       SLP Education - 08/06/18 1500    Education Details  fluency strategies, word finding strategies    Person(s) Educated  Patient    Methods  Explanation    Comprehension  Verbalized understanding         SLP Long Term Goals - 07/30/18 1600      SLP LONG TERM GOAL #1   Title  Patient will generate grammatical, fluent, and cogent sentences to complete abstract/complex linguistic task with 80% accuracy.    Time  8    Period  Weeks    Status  New    Target Date  09/28/18      SLP LONG TERM GOAL #2   Title  Patient will complete high level word finding tasks with 80% accuracy.    Time  8    Period  Weeks    Status  New    Target Date  09/28/18      SLP LONG TERM GOAL #3   Title  Patient will write  grammatical and cogent sentence to complete abstract/complex linguistic task with 80% accuracy.    Time  8    Period  Weeks    Status  New    Target Date  09/28/18      SLP LONG TERM GOAL #4   Title  Patient will demonstrate reading comprehension for paragraphs with 80% accuracy.     Time  8    Period  Weeks    Status  New    Target Date  09/28/18       Plan - 08/06/18 1500    Clinical Impression Statement  The patient was much more fluent today and in better spirits.      Speech Therapy Frequency  2x / week    Duration  Other (comment)    Treatment/Interventions  Language facilitation;SLP instruction and feedback;Compensatory techniques;Cognitive reorganization;Patient/family education    Potential to Achieve Goals  Good    Potential Considerations  Ability to learn/carryover information;Pain level;Family/community support;Co-morbidities;Previous level of function;Cooperation/participation level;Severity of impairments;Medical prognosis    SLP Home Exercise Plan  fluency HEP    Consulted and Agree with Plan of Care  Patient       Patient will benefit from skilled therapeutic intervention in order to improve the following deficits and impairments:   Apraxia following nontraumatic intracerebral hemorrhage  Aphasia  Cognitive communication deficit    Problem List Patient Active Problem List   Diagnosis Date Noted  . Diverticulitis of colon 06/21/2018  . Sigmoid diverticulitis   . Coronary artery disease 03/09/2018  . Diabetes ([place]) 03/09/2018  . Anxiety, generalized 10/20/2016  . H/O carotid endarterectomy 10/20/2016  . Hyperlipidemia, mixed 10/20/2016  . Pruritic erythematous rash 10/20/2016  . Dependence on nicotine from other tobacco product 07/11/2016  . Tobacco abuse 07/11/2016  . Family history of colon cancer 12/07/2015  . Family history of ovarian cancer 12/07/2015  . Prediabetes 12/04/2015  . Hx of endarterectomy 11/02/2015  . Essential hypertension  11/02/2015  . Hypertriglyceridemia 09/03/2015  . Adiposity 09/03/2015  . BMI 29.0-29.9,adult 07/17/2015  . Anxiety 07/17/2015  . GERD (gastroesophageal reflux disease) 07/17/2015  . Hot flashes, menopausal 07/17/2015   Leroy Sea, MS/CCC- SLP  Lou Miner 08/06/2018, 3:01 PM  [place] MAIN [facility] SERVICES [address] [place], [place], [address] Phone: [phone]   Fax:  [phone]   Name: Courtney King MRN: [id number] Date of Birth: Oct 04, 1966

## 2018-08-10 ENCOUNTER — Encounter: Payer: Self-pay | Admitting: Speech Pathology

## 2018-08-10 ENCOUNTER — Other Ambulatory Visit: Payer: Self-pay

## 2018-08-10 ENCOUNTER — Ambulatory Visit: Payer: Managed Care, Other (non HMO) | Admitting: Speech Pathology

## 2018-08-10 ENCOUNTER — Encounter: Payer: Self-pay | Admitting: Occupational Therapy

## 2018-08-10 ENCOUNTER — Ambulatory Visit: Payer: Managed Care, Other (non HMO) | Admitting: Occupational Therapy

## 2018-08-10 DIAGNOSIS — R4701 Aphasia: Secondary | ICD-10-CM | POA: Diagnosis not present

## 2018-08-10 DIAGNOSIS — M6281 Muscle weakness (generalized): Secondary | ICD-10-CM

## 2018-08-10 DIAGNOSIS — R41841 Cognitive communication deficit: Secondary | ICD-10-CM

## 2018-08-10 DIAGNOSIS — R278 Other lack of coordination: Secondary | ICD-10-CM

## 2018-08-10 DIAGNOSIS — I69190 Apraxia following nontraumatic intracerebral hemorrhage: Secondary | ICD-10-CM

## 2018-08-10 NOTE — Therapy (Signed)
[place] MAIN [facility] SERVICES [address] [place], [place], [address] Phone: [phone]   Fax:  [phone]  Speech Language Pathology Treatment  Patient Details  Name: Courtney King MRN: [id number] Date of Birth: Feb 07, 1966 Referring Provider: Luciana Axe    Encounter Date: 08/10/2018  End of Session - 08/10/18 1604    Visit Number  4    Number of Visits  17    Date for SLP Re-Evaluation  09/28/18    SLP Start Time  1400    SLP Stop Time   1450    SLP Time Calculation (min)  50 min    Activity Tolerance  Patient tolerated treatment well       Past Medical History:  Diagnosis Date  . Allergy   . Anxiety   . Diabetes mellitus without complication ([place])   . GERD (gastroesophageal reflux disease)   . H/O blood clots 2014   in abdominal aorta - endarterectomy at [facility]  . Hyperlipidemia   . Hypertension   . Left leg claudication [facility])     Past Surgical History:  Procedure Laterality Date  . ABDOMINAL HYSTERECTOMY  2004  . AORTIC ENDARTERECETOMY  2013  . CHOLECYSTECTOMY  1999  . COLONOSCOPY WITH PROPOFOL N/A 04/03/2017   Procedure: COLONOSCOPY WITH PROPOFOL;  Surgeon: Jonathon Bellows, MD;  Location: [facility] ENDOSCOPY;  Service: Endoscopy;  Laterality: N/A;  . ESOPHAGOGASTRODUODENOSCOPY (EGD) WITH PROPOFOL N/A 04/03/2017   Procedure: ESOPHAGOGASTRODUODENOSCOPY (EGD) WITH PROPOFOL;  Surgeon: Jonathon Bellows, MD;  Location: [facility] ENDOSCOPY;  Service: Endoscopy;  Laterality: N/A;  . EUS N/A 04/30/2017   Procedure: FULL UPPER ENDOSCOPIC ULTRASOUND (EUS) RADIAL;  Surgeon: Jola Schmidt, MD;  Location: [facility] ENDOSCOPY;  Service: Endoscopy;  Laterality: N/A;  . FOOT SURGERY Left    plantar fasciitis  . LAPAROSCOPIC NISSEN FUNDOPLICATION  0488   done in [place] to eliminate acid reflux  . LAPAROSCOPIC SIGMOID COLECTOMY N/A 06/29/2018   Procedure: LAPAROSCOPIC SIGMOID COLECTOMY;  Surgeon: Jules Husbands, MD;  Location: [facility] ORS;  Service:  General;  Laterality: N/A;  . OOPHORECTOMY Bilateral 2004    There were no vitals filed for this visit.  Subjective Assessment - 08/10/18 1603    Subjective  The patient demonstrates a brighter affect today            ADULT SLP TREATMENT - 08/10/18 0001      General Information   Behavior/Cognition  Alert;Cooperative;Pleasant mood    HPI  Courtney King is a 52 y.o. female: presents for f/u stroke. 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 Deficits began after surgery on 06/29/2018 but were thought to be 2/2 anesthesia. Pt has followed with neurology. Has appt with ophthamology for visual field deficits this week. Awaiting OT/ Speech eval for aphasia- slow and difficult speech since 8/13.       Treatment Provided   Treatment provided  Cognitive-Linquistic      Pain Assessment   Pain Assessment  No/denies pain      Cognitive-Linquistic Treatment   Treatment focused on  Apraxia;Aphasia    Skilled Treatment  The patient completed a variety of moderately complex cognitive-linguistic tasks (synonyms, name item defined, state multiple meanings, antonyms) with overall 70% accuracy and 80% fluency.      Assessment / Recommendations / Plan   Plan  Continue with current plan of care      Progression Toward Goals   Progression toward goals  Progressing toward goals  SLP Education - 08/10/18 1604    Education Details  fluency strategies, word finding strategies    Person(s) Educated  Patient    Methods  Explanation    Comprehension  Verbalized understanding         SLP Long Term Goals - 07/30/18 1600      SLP LONG TERM GOAL #1   Title  Patient will generate grammatical, fluent, and cogent sentences to complete abstract/complex linguistic task with 80% accuracy.    Time  8    Period  Weeks    Status  New    Target Date  09/28/18      SLP LONG TERM GOAL #2   Title  Patient will complete high level word finding tasks with 80% accuracy.    Time  8     Period  Weeks    Status  New    Target Date  09/28/18      SLP LONG TERM GOAL #3   Title  Patient will write grammatical and cogent sentence to complete abstract/complex linguistic task with 80% accuracy.    Time  8    Period  Weeks    Status  New    Target Date  09/28/18      SLP LONG TERM GOAL #4   Title  Patient will demonstrate reading comprehension for paragraphs with 80% accuracy.     Time  8    Period  Weeks    Status  New    Target Date  09/28/18       Plan - 08/10/18 1604    Clinical Impression Statement  The patient remains more fluent and  less bothered by delays in word finding.    Speech Therapy Frequency  2x / week    Duration  Other (comment)    Treatment/Interventions  Language facilitation;SLP instruction and feedback;Compensatory techniques;Cognitive reorganization;Patient/family education    Potential to Achieve Goals  Good    Potential Considerations  Ability to learn/carryover information;Pain level;Family/community support;Co-morbidities;Previous level of function;Cooperation/participation level;Severity of impairments;Medical prognosis    SLP Home Exercise Plan  fluency HEP    Consulted and Agree with Plan of Care  Patient       Patient will benefit from skilled therapeutic intervention in order to improve the following deficits and impairments:   Apraxia following nontraumatic intracerebral hemorrhage  Aphasia    Problem List Patient Active Problem List   Diagnosis Date Noted  . Diverticulitis of colon 06/21/2018  . Sigmoid diverticulitis   . Coronary artery disease 03/09/2018  . Diabetes ([place]) 03/09/2018  . Anxiety, generalized 10/20/2016  . H/O carotid endarterectomy 10/20/2016  . Hyperlipidemia, mixed 10/20/2016  . Pruritic erythematous rash 10/20/2016  . Dependence on nicotine from other tobacco product 07/11/2016  . Tobacco abuse 07/11/2016  . Family history of colon cancer 12/07/2015  . Family history of ovarian cancer 12/07/2015   . Prediabetes 12/04/2015  . Hx of endarterectomy 11/02/2015  . Essential hypertension 11/02/2015  . Hypertriglyceridemia 09/03/2015  . Adiposity 09/03/2015  . BMI 29.0-29.9,adult 07/17/2015  . Anxiety 07/17/2015  . GERD (gastroesophageal reflux disease) 07/17/2015  . Hot flashes, menopausal 07/17/2015   Leroy Sea, MS/CCC- SLP  Lou Miner 08/10/2018, 4:06 PM  [place] MAIN [facility] SERVICES [address] [place], [place], [address] Phone: [phone]   Fax:  [phone]   Name: Courtney King MRN: [id number] Date of Birth: [birth year]

## 2018-08-12 ENCOUNTER — Ambulatory Visit: Payer: Managed Care, Other (non HMO) | Admitting: Occupational Therapy

## 2018-08-14 NOTE — Therapy (Addendum)
[place] MAIN [facility] SERVICES [address] [place], [place], [address] Phone: [phone]   Fax:  [phone]  Occupational Therapy Evaluation  Patient Details  Name: Courtney King MRN: [id number] Date of Birth: 1966/01/04 No data recorded  Encounter Date: 08/10/2018  OT End of Session - 08/16/18 1043    Visit Number  1    Number of Visits  24    Date for OT Re-Evaluation  11/04/18    Authorization Type  [employer]    OT Start Time  1300    OT Stop Time  1355    OT Time Calculation (min)  55 min    Activity Tolerance  Patient tolerated treatment well    Behavior During Therapy  [facility] for tasks assessed/performed       Past Medical History:  Diagnosis Date  . Allergy   . Anxiety   . Diabetes mellitus without complication ([place])   . GERD (gastroesophageal reflux disease)   . H/O blood clots 2014   in abdominal aorta - endarterectomy at [facility]  . Hyperlipidemia   . Hypertension   . Left leg claudication [facility])     Past Surgical History:  Procedure Laterality Date  . ABDOMINAL HYSTERECTOMY  2004  . AORTIC ENDARTERECETOMY  2013  . CHOLECYSTECTOMY  1999  . COLONOSCOPY WITH PROPOFOL N/A 04/03/2017   Procedure: COLONOSCOPY WITH PROPOFOL;  Surgeon: Jonathon Bellows, MD;  Location: [facility] ENDOSCOPY;  Service: Endoscopy;  Laterality: N/A;  . ESOPHAGOGASTRODUODENOSCOPY (EGD) WITH PROPOFOL N/A 04/03/2017   Procedure: ESOPHAGOGASTRODUODENOSCOPY (EGD) WITH PROPOFOL;  Surgeon: Jonathon Bellows, MD;  Location: [facility] ENDOSCOPY;  Service: Endoscopy;  Laterality: N/A;  . EUS N/A 04/30/2017   Procedure: FULL UPPER ENDOSCOPIC ULTRASOUND (EUS) RADIAL;  Surgeon: Jola Schmidt, MD;  Location: [facility] ENDOSCOPY;  Service: Endoscopy;  Laterality: N/A;  . FOOT SURGERY Left    plantar fasciitis  . LAPAROSCOPIC NISSEN FUNDOPLICATION  8563   done in [place] to eliminate acid reflux  . LAPAROSCOPIC SIGMOID COLECTOMY N/A 06/29/2018   Procedure: LAPAROSCOPIC SIGMOID COLECTOMY;   Surgeon: Jules Husbands, MD;  Location: [facility] ORS;  Service: General;  Laterality: N/A;  . OOPHORECTOMY Bilateral 2004    There were no vitals filed for this visit.  Subjective Assessment - 08/16/18 1038    Subjective   Patient reports she went in for surgery on August 13th to have part of her colon removed which was infected.  when she woke up, she realized something was wrong, at first they thought it was the anesthesia.  She was having trouble with her right hand, missing her mouth when trying to take meds.  When she returned to have her staples out on august 27th she had a brain scan to confirm she had a stroke.    Pertinent History  Patient reports she went in for surgery on August 13th to have part of her colon removed which was infected.  when she woke up, she realized something was wrong, at first they thought it was the anesthesia.  She was having trouble with her right hand, missing her mouth when trying to take meds.  When she returned to have her staples out on august 27th she had a brain scan to confirm she had a stroke.    Patient Stated Goals  "I want to be able to go back to work" , be independent     Currently in Pain?  No/denies    Pain Score  0-No pain  Multiple Pain Sites  No        [facility] OT Assessment - 08/16/18 1039      Assessment   Medical Diagnosis  CVA    Onset Date/Surgical Date  06/29/18    Hand Dominance  Right      Precautions   Precautions  None      Balance Screen   Has the patient fallen in the past 6 months  No      Home  Environment   Family/patient expects to be discharged to:  Private residence    Living Arrangements  Spouse/significant other    Available Help at Discharge  Family    Type of [place]  One level    Bathroom Shower/Tub  Tub/Shower unit;Curtain    Shower/tub characteristics  Curtain    Field seismologist    Lives With  Spouse;Son      Prior Function   Level of Independence   Independent    Vocation  Full time employment      ADL   Eating/Feeding  Other (comment )   taking smaller bites, some ataxia   Grooming  --   difficulty with performing hair care as she did previously   Upper Body Bathing  Modified independent    Lower Body Bathing  Modified independent    Upper Body Dressing  Increased time    Lower Body Dressing  Increased time    Toilet Transfer  Modified independent    Where Assessed - Toileting Clothing Manipulationn  Modified independent    Tub/Shower Transfer  Supervision/safety      IADL   Prior Level of Function Shopping  independent    Shopping  Assistance for transportation;Needs to be accompanied on any shopping trip    Prior Level of Function Light Housekeeping  independent    Light Housekeeping  Performs light daily tasks but cannot maintain acceptable level of cleanliness;Needs help with all home maintenance tasks    Prior Level of Function Meal Prep  independent    Meal Prep  Needs to have meals prepared and served    Prior Level of Function IT consultant on family or friends for transportation    Prior Level of Function Medication Managment  independent    Medication Management  Is responsible for taking medication in correct dosages at correct time    Prior Level of Function Financial Management  independent    Financial Management  Requires supervision/minimal cuing      Mobility   Mobility Status  Independent      Written Expression   Handwriting  Mild micrographia      Vision - History   Baseline Vision  Wears glasses only for reading    Additional Comments  Patient reports she was seen at the eye MD who diagnosed her with right field deficit, she reports she did not have any vision in her right eye initially after the stroke and could not see her phone.  Bumped into things at times, had to put all the icons to the left on the computer so she could see them.  Headaches noted  when on the computer.       Cognition   Overall Cognitive Status  Impaired/Different from baseline    Memory  Impaired    Memory Impairment  Decreased short term memory    Problem Solving  Impaired      Sensation   Light Touch  Appears Intact    Stereognosis  Appears Intact    Hot/Cold  Appears Intact    Proprioception  Appears Intact      Coordination   Gross Motor Movements are Fluid and Coordinated  No    Fine Motor Movements are Fluid and Coordinated  No    9 Hole Peg Test  Right;Left    Right 9 Hole Peg Test  26 secs    Left 9 Hole Peg Test  23 secs      ROM / Strength   AROM / PROM / Strength  AROM;Strength      AROM   Overall AROM   Within functional limits for tasks performed      Strength   Overall Strength  Deficits    Overall Strength Comments  RUE 4/5 overall, LUE 5/5 overall      Hand Function   Right Hand Grip (lbs)  20    Right Hand Lateral Pinch  9 lbs    Right Hand 3 Point Pinch  11 lbs    Left Hand Grip (lbs)  40    Left Hand Lateral Pinch  12 lbs    Left 3 point pinch  14 lbs        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  She is on the computer most of the day at work and now cannot  Tolerate any exposure to electronics without triggering pain (headache).  She reports difficulty with problem solving such as attempting to wash windows and could not figure out how to close the window after washing despite doing this in the past many times.  She requires assist to pay bills and balance financial information.  She also has difficulty with following what is happening on a TV show.   She demonstrates mild visual field deficits on the right and needs further assessment and focus.    She enjoys camping and has 3 small dogs.  She is not able to drive currently. Her handwriting is legible but demonstrates some mild micrographia.  Reports word finding difficulty Increased time to  complete all tasks since CVA              OT Education - 08/16/18 1043    Education Details  POC, goals, OT role    Methods  Explanation    Comprehension  Verbalized understanding          OT Long Term Goals - 08/16/18 1054      OT LONG TERM GOAL #1   Title  Patient will complete hair care with modified independence.     Baseline  difficulty with reaching and completing at eval.     Time  8    Period  Weeks    Status  New    Target Date  10/05/18      OT LONG TERM GOAL #2   Title  Patient will complete shower transfers with modified independence     Baseline  supervision for safety at eval     Time  8    Period  Weeks    Status  New    Target Date  10/05/18      OT LONG TERM GOAL #3   Title  Patient will complete light meal preparation with modified independence     Baseline  unable at eval  Time  12    Period  Weeks    Status  New    Target Date  11/04/18      OT LONG TERM GOAL #4   Title  Patient will complete light housekeeping tasks with modified independence     Baseline  unable at eval     Time  12    Period  Weeks    Status  New    Target Date  11/04/18      OT LONG TERM GOAL #5   Title  Patient will complete money management with bill paying and balancing accounts with 100% accuracy     Baseline  requires min assist at eval     Time  12    Period  Weeks    Status  New    Target Date  11/04/18      Long Term Additional Goals   Additional Long Term Goals  Yes      OT LONG TERM GOAL #6   Title  Patient will tolerate 30 mins of tasks on the computer working towards work tasks without complaints of pain (headache)    Baseline  unable at eval    Time  12    Period  Weeks    Status  New    Target Date  11/04/18            Plan - 08/16/18 1044    Clinical Impression Statement  Courtney King is a 52 y.o. female: presents for f/u stroke. 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 Deficits began after surgery on  06/29/2018 but were thought to be 2/2 anesthesia.  She was referred to OT for evaluation.  She presents with right dominant mild muscle weakness, mild lack of coordination, unable to drive presently, she demonstrates some right field vision deficits, reqiures increased time to process information, mild memory deficits as well as impairments with problem solving and executive functions.  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  She reports difficulty in word finding and reading and is currently being seen by SLP.  She would benefit from skilled OT to maximize safety and independence in daily tasks.     Occupational Profile and client history currently impacting functional performance  current smoker    Occupational performance deficits (Please refer to evaluation for details):  ADL's;IADL's;Work;Social Participation    Rehab Potential  Good    Current Impairments/barriers affecting progress:  headaches with electronic devices, works full time on the computer with productivity demands, right visual deficits, impaired cognitive processing, problem solving    OT Frequency  2x / week    OT Duration  12 weeks    OT Treatment/Interventions  Self-care/ADL training;Therapeutic exercise;Visual/perceptual remediation/compensation;Moist Heat;Neuromuscular education;Patient/family education;Therapeutic activities;Manual Therapy;Cognitive remediation/compensation;DME and/or AE instruction    Clinical Decision Making  Limited treatment options, no task modification necessary    Consulted and Agree with Plan of Care  Patient       Patient will benefit from skilled therapeutic intervention in order to improve the following deficits and impairments:  Decreased cognition, Impaired vision/preception, Decreased coordination, Decreased activity  tolerance, Decreased strength, Impaired UE functional use  Visit Diagnosis: Muscle weakness (generalized)  Other lack of coordination  Cognitive communication deficit    Problem List Patient Active Problem List   Diagnosis Date Noted  .  Diverticulitis of colon 06/21/2018  . Sigmoid diverticulitis   . Coronary artery disease 03/09/2018  . Diabetes ([place]) 03/09/2018  . Anxiety, generalized 10/20/2016  . H/O carotid endarterectomy 10/20/2016  . Hyperlipidemia, mixed 10/20/2016  . Pruritic erythematous rash 10/20/2016  . Dependence on nicotine from other tobacco product 07/11/2016  . Tobacco abuse 07/11/2016  . Family history of colon cancer 12/07/2015  . Family history of ovarian cancer 12/07/2015  . Prediabetes 12/04/2015  . Hx of endarterectomy 11/02/2015  . Essential hypertension 11/02/2015  . Hypertriglyceridemia 09/03/2015  . Adiposity 09/03/2015  . BMI 29.0-29.9,adult 07/17/2015  . Anxiety 07/17/2015  . GERD (gastroesophageal reflux disease) 07/17/2015  . Hot flashes, menopausal 07/17/2015   Courtney King T Tomasita Morrow, OTR/L, CLT  Brasen Bundren 08/16/2018, 10:59 AM  [place] MAIN [facility] SERVICES [address] [place], [place], [address] Phone: [phone]   Fax:  [phone]  Name: RICARDA ATAYDE MRN: [id number] Date of Birth: [birth year]

## 2018-08-16 ENCOUNTER — Ambulatory Visit: Payer: Managed Care, Other (non HMO) | Admitting: Occupational Therapy

## 2018-08-16 ENCOUNTER — Ambulatory Visit: Payer: Managed Care, Other (non HMO) | Admitting: Physical Therapy

## 2018-08-16 ENCOUNTER — Encounter: Payer: Self-pay | Admitting: Occupational Therapy

## 2018-08-16 ENCOUNTER — Encounter: Payer: Self-pay | Admitting: Physical Therapy

## 2018-08-16 ENCOUNTER — Other Ambulatory Visit: Payer: Self-pay

## 2018-08-16 DIAGNOSIS — H547 Unspecified visual loss: Secondary | ICD-10-CM

## 2018-08-16 DIAGNOSIS — R41841 Cognitive communication deficit: Secondary | ICD-10-CM

## 2018-08-16 DIAGNOSIS — R4701 Aphasia: Secondary | ICD-10-CM | POA: Diagnosis not present

## 2018-08-16 DIAGNOSIS — M6281 Muscle weakness (generalized): Secondary | ICD-10-CM

## 2018-08-16 NOTE — Addendum Note (Signed)
Addended by: Garlon Hatchet T on: 08/16/2018 11:07 AM   Modules accepted: Orders

## 2018-08-16 NOTE — Therapy (Addendum)
[place] MAIN [facility] SERVICES [address] [place], [place], [address] Phone: [phone]   Fax:  [phone]  Occupational Therapy Treatment  Patient Details  Name: Courtney King MRN: [id number] Date of Birth: November 02, 1966 No data recorded  Encounter Date: 08/16/2018  OT End of Session - 08/16/18 1707    Visit Number  2    Number of Visits  24    Date for OT Re-Evaluation  11/04/18    Authorization Type  [employer]    OT Start Time  1635    OT Stop Time  1720   OT Time Calculation (min)  45 min    Activity Tolerance  Patient tolerated treatment well    Behavior During Therapy  [facility] for tasks assessed/performed       Past Medical History:  Diagnosis Date  . Allergy   . Anxiety   . Diabetes mellitus without complication ([place])   . GERD (gastroesophageal reflux disease)   . H/O blood clots 2014   in abdominal aorta - endarterectomy at [facility]  . Hyperlipidemia   . Hypertension   . Left leg claudication [facility])     Past Surgical History:  Procedure Laterality Date  . ABDOMINAL HYSTERECTOMY  2004  . AORTIC ENDARTERECETOMY  2013  . CHOLECYSTECTOMY  1999  . COLONOSCOPY WITH PROPOFOL N/A 04/03/2017   Procedure: COLONOSCOPY WITH PROPOFOL;  Surgeon: Jonathon Bellows, MD;  Location: [facility] ENDOSCOPY;  Service: Endoscopy;  Laterality: N/A;  . ESOPHAGOGASTRODUODENOSCOPY (EGD) WITH PROPOFOL N/A 04/03/2017   Procedure: ESOPHAGOGASTRODUODENOSCOPY (EGD) WITH PROPOFOL;  Surgeon: Jonathon Bellows, MD;  Location: [facility] ENDOSCOPY;  Service: Endoscopy;  Laterality: N/A;  . EUS N/A 04/30/2017   Procedure: FULL UPPER ENDOSCOPIC ULTRASOUND (EUS) RADIAL;  Surgeon: Jola Schmidt, MD;  Location: [facility] ENDOSCOPY;  Service: Endoscopy;  Laterality: N/A;  . FOOT SURGERY Left    plantar fasciitis  . LAPAROSCOPIC NISSEN FUNDOPLICATION  2831   done in [place] to eliminate acid reflux  . LAPAROSCOPIC SIGMOID COLECTOMY N/A 06/29/2018   Procedure: LAPAROSCOPIC SIGMOID COLECTOMY;   Surgeon: Jules Husbands, MD;  Location: [facility] ORS;  Service: General;  Laterality: N/A;  . OOPHORECTOMY Bilateral 2004    There were no vitals filed for this visit.  Subjective Assessment - 08/16/18 1702    Subjective   Pt reports that she has been able to recall more about what her previous job required during treatment today than she has since experiencing the stroke.    Pertinent History  Patient reports she went in for surgery on August 13th to have part of her colon removed which was infected.  when she woke up, she realized something was wrong, at first they thought it was the anesthesia.  She was having trouble with her right hand, missing her mouth when trying to take meds.  When she returned to have her staples out on august 27th she had a brain scan to confirm she had a stroke.    Patient Stated Goals  "I want to be able to go back to work" , be independent     Currently in Pain?  No/denies    Pain Score  0-No pain    Multiple Pain Sites  No       OT TREATMENT  Self-care:  Pt completed various cognitive development tasks including identifying mistakes on a monthly calendar, answering questions about an individual's monthly schedule, and determining the details of sample bills from simple to complex. Pt completed all tasks with 100%  accuracy; however, she required increased time and cuing for how to complete each task. Pt was able to read the directions but she was not able to follow the directions without verbal instruction. Pt reported requiring increased time for similar tasks at home. Pt. Worked on visual scanning, and visual search strategies in order to improve visual compensatory strategies during ADL, and IADL tasks. Pt. Worked on visual scanning, and visual search strategies in her near space at the tabletop using [employer] word search with one omission to the right using horizontal visual search strategies. Pt. completed the task in 3 min. & 10 sec. Pt. worked on scanning tasks  matching cards to her right, and extending the scan field further to the right at the tabletop. Pt. worked on Contractor strategies in her extrapersonal  space scanning in the kitchen. Pt. missed 1 item during the scanning task to the left. Pt. completed a visual scanning course in a hallway/corridor. Pt. had multiple omissions to the right, and left during the task. Pt. had to slow her pace several times to locate, and identify the cards located at various heights to the left, and right. At times pt. turned her body fully to the right, or left to identify the cards.                OT Education - 08/16/18 1706    Education Details  Functional cognition, visual compensatory strategies    Person(s) Educated  Patient    Methods  Explanation    Comprehension  Verbalized understanding          OT Long Term Goals - 08/16/18 1054      OT LONG TERM GOAL #1   Title  Patient will complete hair care with modified independence.     Baseline  difficulty with reaching and completing at eval.     Time  8    Period  Weeks    Status  New    Target Date  10/05/18      OT LONG TERM GOAL #2   Title  Patient will complete shower transfers with modified independence     Baseline  supervision for safety at eval     Time  8    Period  Weeks    Status  New    Target Date  10/05/18      OT LONG TERM GOAL #3   Title  Patient will complete light meal preparation with modified independence     Baseline  unable at eval     Time  12    Period  Weeks    Status  New    Target Date  11/04/18      OT LONG TERM GOAL #4   Title  Patient will complete light housekeeping tasks with modified independence     Baseline  unable at eval     Time  12    Period  Weeks    Status  New    Target Date  11/04/18      OT LONG TERM GOAL #5   Title  Patient will complete money management with bill paying and balancing accounts with 100% accuracy     Baseline  requires min assist at eval     Time  12     Period  Weeks    Status  New    Target Date  11/04/18      Long Term Additional Goals   Additional Long Term Goals  Yes      OT LONG TERM GOAL #6   Title  Patient will tolerate 30 mins of tasks on the computer working towards work tasks without complaints of pain (headache)    Baseline  unable at eval    Time  12    Period  Weeks    Status  New    Target Date  11/04/18            Plan - 08/16/18 1708    Clinical Impression Statement Pt continues to have difficulties with memory and congnition. Pt reports having a hard time recalling what her previous job requirements were. 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 Pt was able to complete simple problem solving activities with 100% accuracy however she required increased time to do so. Pt. continues to present with right sided visual field impairments, and continues to work on improving  visual compensatory strategies in order to be able navigate independently within her environment, navigate through new places, and complete ADL, and IADL tasks independently in near space at the tabletop.   Occupational Profile and client history currently impacting functional performance  current smoker    Occupational performance deficits (Please refer to evaluation for details):  ADL's;IADL's;Work;Social Participation    Rehab Potential  Good    Current Impairments/barriers affecting progress:  headaches with electronic devices, works full time on the computer with productivity demands, right visual deficits, impaired cognitive processing, problem solving    OT Frequency  2x / week    OT Duration  12 weeks    OT Treatment/Interventions  Self-care/ADL training;Therapeutic exercise;Visual/perceptual remediation/compensation;Moist Heat;Neuromuscular education;Patient/family education;Therapeutic activities;Manual Therapy;Cognitive remediation/compensation;DME and/or AE instruction    Clinical  Decision Making  Limited treatment options, no task modification necessary    Consulted and Agree with Plan of Care  Patient       Patient will benefit from skilled therapeutic intervention in order to improve the following deficits and impairments:  Decreased cognition, Impaired vision/preception, Decreased coordination, Decreased activity tolerance, Decreased strength, Impaired UE functional use  Visit Diagnosis: Cognitive social or emotional deficit following cerebral infarction    Problem List Patient Active Problem List   Diagnosis Date Noted  . Diverticulitis of colon 06/21/2018  . Sigmoid diverticulitis   . Coronary artery disease 03/09/2018  . Diabetes ([place]) 03/09/2018  . Anxiety, generalized 10/20/2016  . H/O carotid endarterectomy 10/20/2016  . Hyperlipidemia, mixed 10/20/2016  . Pruritic erythematous rash 10/20/2016  . Dependence on nicotine from other tobacco product 07/11/2016  . Tobacco abuse 07/11/2016  . Family history of colon cancer 12/07/2015  . Family history of ovarian cancer 12/07/2015  . Prediabetes 12/04/2015  . Hx of endarterectomy 11/02/2015  . Essential hypertension 11/02/2015  . Hypertriglyceridemia 09/03/2015  . Adiposity 09/03/2015  . BMI 29.0-29.9,adult 07/17/2015  . Anxiety 07/17/2015  . GERD (gastroesophageal reflux disease) 07/17/2015  . Hot flashes, menopausal 07/17/2015    Oliver Hum, OTS 08/16/2018, 5:18 PM   This entire session was performed under direct supervision and direction of a licensed therapist/therapist assistant . I have personally read, edited and approve of the note as written.  Harrel Carina, MS, OTR/L   [place] MAIN [facility] SERVICES [address] [place], [place], [address] Phone: [phone]   Fax:  [phone]  Name: Courtney King MRN: [id number] Date of Birth: 10-23-1966

## 2018-08-16 NOTE — Therapy (Signed)
[place] MAIN [facility] SERVICES [address] [place], [place], [address] Phone: [phone]   Fax:  [phone]  Physical Therapy Evaluation  Patient Details  Name: Courtney King MRN: [id number] Date of Birth: 01/26/1966 Referring Provider (PT): Dr. Melrose Nakayama    Encounter Date: 08/16/2018    Past Medical History:  Diagnosis Date  . Allergy   . Anxiety   . Diabetes mellitus without complication ([place])   . GERD (gastroesophageal reflux disease)   . H/O blood clots 2014   in abdominal aorta - endarterectomy at [facility]  . Hyperlipidemia   . Hypertension   . Left leg claudication [facility])     Past Surgical History:  Procedure Laterality Date  . ABDOMINAL HYSTERECTOMY  2004  . AORTIC ENDARTERECETOMY  2013  . CHOLECYSTECTOMY  1999  . COLONOSCOPY WITH PROPOFOL N/A 04/03/2017   Procedure: COLONOSCOPY WITH PROPOFOL;  Surgeon: Jonathon Bellows, MD;  Location: [facility] ENDOSCOPY;  Service: Endoscopy;  Laterality: N/A;  . ESOPHAGOGASTRODUODENOSCOPY (EGD) WITH PROPOFOL N/A 04/03/2017   Procedure: ESOPHAGOGASTRODUODENOSCOPY (EGD) WITH PROPOFOL;  Surgeon: Jonathon Bellows, MD;  Location: [facility] ENDOSCOPY;  Service: Endoscopy;  Laterality: N/A;  . EUS N/A 04/30/2017   Procedure: FULL UPPER ENDOSCOPIC ULTRASOUND (EUS) RADIAL;  Surgeon: Jola Schmidt, MD;  Location: [facility] ENDOSCOPY;  Service: Endoscopy;  Laterality: N/A;  . FOOT SURGERY Left    plantar fasciitis  . LAPAROSCOPIC NISSEN FUNDOPLICATION  9675   done in [place] to eliminate acid reflux  . LAPAROSCOPIC SIGMOID COLECTOMY N/A 06/29/2018   Procedure: LAPAROSCOPIC SIGMOID COLECTOMY;  Surgeon: Jules Husbands, MD;  Location: [facility] ORS;  Service: General;  Laterality: N/A;  . OOPHORECTOMY Bilateral 2004    There were no vitals filed for this visit.   Subjective Assessment - 08/16/18 1539    Subjective  Patient had surgery on 06/29/2018 and "during surgery, I had a stroke." Pt reports she wasn't diagnosed until 2 weeks  later. Pt reports she was unable to feet herself, kept choking on water, loss of words, and "I couldn't think." Pt does not notice anything different in her walking or balance; feels weakness in R arm and loss of peripheral vision on the R.     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 Pt presents with diagnosis of apraxia. Pt previously worked Teaching laboratory technician job at [employer]; wants to get back to work but having difficulties with vision and fine motor tasks. PMH significant for GERD, hiatal hernia, hypertension, and hyperlipidemia.     How long can you sit comfortably?  N/A    How long can you stand comfortably?  "As long as I want- I'm just lazy"    How long can you walk comfortably?  Probably 15 min or so     Diagnostic tests  CT scan and MRI- ischemic parietal and frontal stroke    Patient Stated Goals  "Go back to work"     Currently in Pain?  No/denies    Multiple Pain Sites  No         [facility] PT Assessment - 08/16/18 1552      Assessment   Medical Diagnosis  CVA    Referring Provider (PT)  Dr. Melrose Nakayama     Onset Date/Surgical Date  06/29/18    Hand Dominance  Right    Next MD Visit  01/15/2018    Prior Therapy  None  Precautions   Precautions  None      Restrictions   Weight Bearing Restrictions  No      Balance Screen   Has the patient fallen in the past 6 months  No    Has the patient had a decrease in activity level because of a fear of falling?   No    Is the patient reluctant to leave their home because of a fear of falling?   No      Home Social worker  Private residence    Living Arrangements  Spouse/significant other;Children    Available Help at Discharge  Family;Friend(s)    Type of [place] to enter    Entrance Stairs-Number of Steps  2    Entrance Stairs-Rails  Can reach both    [place]  One level    Home Equipment  None       Prior Function   Level of Independence  Independent    Vocation  On disability    Publishing rights manager work, number work    Leisure  Science writer, 3 dogs- walking when not too hot      Cognition   Overall Cognitive Status  Impaired/Different from baseline      Sensation   Light Touch  Appears Intact      Coordination   Gross Motor Movements are Fluid and Coordinated  Yes    Fine Motor Movements are Fluid and Coordinated  Yes    Finger Nose Finger Test  Equal and accurate on both sides    Heel Shin Test  Equal and accurate      Strength   Overall Strength  Within functional limits for tasks performed    Overall Strength Comments  B LE 4+/5      Transfers   Transfers  Independent with all Transfers      Ambulation/Gait   Gait Comments  Equal step length, no decreased speed noted, no AD       6 Minute Walk- Baseline   BP (mmHg)  107/50    HR (bpm)  93    02 Sat (%RA)  99 %      6 Minute walk- Post Test   BP (mmHg)  110/61    HR (bpm)  121    02 Sat (%RA)  100 %      6 minute walk test results    Aerobic Endurance Distance Walked  1380      Standardized Balance Assessment   Five times sit to stand comments   9.1 sec without UE support    10 Meter Walk  1.38 m/s without AD (7.2 sec)      High Level Balance   High Level Balance Comments  Romberg, Romberg eyes closed, tandem stance on ground, tandem stance on compliant surfaces, SLS on ground > 10 sec each side without UE support, no increased sway noted with any activities                Objective measurements completed on examination: See above findings.                             Patient will benefit from skilled therapeutic intervention in order to improve the following deficits and impairments:     Visit Diagnosis: Muscle weakness (generalized)     Problem List Patient Active Problem List  Diagnosis Date Noted  . Diverticulitis of colon 06/21/2018  . Sigmoid  diverticulitis   . Coronary artery disease 03/09/2018  . Diabetes ([place]) 03/09/2018  . Anxiety, generalized 10/20/2016  . H/O carotid endarterectomy 10/20/2016  . Hyperlipidemia, mixed 10/20/2016  . Pruritic erythematous rash 10/20/2016  . Dependence on nicotine from other tobacco product 07/11/2016  . Tobacco abuse 07/11/2016  . Family history of colon cancer 12/07/2015  . Family history of ovarian cancer 12/07/2015  . Prediabetes 12/04/2015  . Hx of endarterectomy 11/02/2015  . Essential hypertension 11/02/2015  . Hypertriglyceridemia 09/03/2015  . Adiposity 09/03/2015  . BMI 29.0-29.9,adult 07/17/2015  . Anxiety 07/17/2015  . GERD (gastroesophageal reflux disease) 07/17/2015  . Hot flashes, menopausal 07/17/2015   Harriet Masson, SPT This entire session was performed under direct supervision and direction of a licensed therapist/therapist assistant . I have personally read, edited and approve of the note as written.  Larron Armor PT, DPT 08/16/2018, 4:57 PM  [place] MAIN [facility] SERVICES [address] [place], [place], [address] Phone: [phone]   Fax:  [phone]  Name: Courtney King MRN: [id number] Date of Birth: December 23, 1965

## 2018-08-18 ENCOUNTER — Ambulatory Visit: Payer: Managed Care, Other (non HMO) | Admitting: Speech Pathology

## 2018-08-18 ENCOUNTER — Ambulatory Visit: Payer: Managed Care, Other (non HMO)

## 2018-08-18 ENCOUNTER — Encounter: Payer: Self-pay | Admitting: Speech Pathology

## 2018-08-18 ENCOUNTER — Ambulatory Visit: Payer: Managed Care, Other (non HMO) | Attending: Family Medicine | Admitting: Occupational Therapy

## 2018-08-18 ENCOUNTER — Encounter: Payer: Self-pay | Admitting: Occupational Therapy

## 2018-08-18 DIAGNOSIS — I69190 Apraxia following nontraumatic intracerebral hemorrhage: Secondary | ICD-10-CM | POA: Insufficient documentation

## 2018-08-18 DIAGNOSIS — R4701 Aphasia: Secondary | ICD-10-CM | POA: Insufficient documentation

## 2018-08-18 DIAGNOSIS — R278 Other lack of coordination: Secondary | ICD-10-CM | POA: Insufficient documentation

## 2018-08-18 DIAGNOSIS — H547 Unspecified visual loss: Secondary | ICD-10-CM | POA: Diagnosis not present

## 2018-08-18 DIAGNOSIS — R41841 Cognitive communication deficit: Secondary | ICD-10-CM

## 2018-08-18 NOTE — Therapy (Signed)
[place] MAIN [facility] SERVICES [address] [place], [place], [address] Phone: [phone]   Fax:  [phone]  Speech Language Pathology Treatment  Patient Details  Name: Courtney King MRN: [id number] Date of Birth: 1966-04-10 Referring Provider (SLP): [place], [place]    Encounter Date: 08/18/2018  End of Session - 08/18/18 1110    Visit Number  5    Number of Visits  17    Date for SLP Re-Evaluation  09/28/18    SLP Start Time  7253    SLP Stop Time   1105    SLP Time Calculation (min)  50 min    Activity Tolerance  Patient tolerated treatment well       Past Medical History:  Diagnosis Date  . Allergy   . Anxiety   . Diabetes mellitus without complication ([place])   . GERD (gastroesophageal reflux disease)   . H/O blood clots 2014   in abdominal aorta - endarterectomy at [facility]  . Hyperlipidemia   . Hypertension   . Left leg claudication [facility])     Past Surgical History:  Procedure Laterality Date  . ABDOMINAL HYSTERECTOMY  2004  . AORTIC ENDARTERECETOMY  2013  . CHOLECYSTECTOMY  1999  . COLONOSCOPY WITH PROPOFOL N/A 04/03/2017   Procedure: COLONOSCOPY WITH PROPOFOL;  Surgeon: Jonathon Bellows, MD;  Location: [facility] ENDOSCOPY;  Service: Endoscopy;  Laterality: N/A;  . ESOPHAGOGASTRODUODENOSCOPY (EGD) WITH PROPOFOL N/A 04/03/2017   Procedure: ESOPHAGOGASTRODUODENOSCOPY (EGD) WITH PROPOFOL;  Surgeon: Jonathon Bellows, MD;  Location: [facility] ENDOSCOPY;  Service: Endoscopy;  Laterality: N/A;  . EUS N/A 04/30/2017   Procedure: FULL UPPER ENDOSCOPIC ULTRASOUND (EUS) RADIAL;  Surgeon: Jola Schmidt, MD;  Location: [facility] ENDOSCOPY;  Service: Endoscopy;  Laterality: N/A;  . FOOT SURGERY Left    plantar fasciitis  . LAPAROSCOPIC NISSEN FUNDOPLICATION  6644   done in [place] to eliminate acid reflux  . LAPAROSCOPIC SIGMOID COLECTOMY N/A 06/29/2018   Procedure: LAPAROSCOPIC SIGMOID COLECTOMY;  Surgeon: Jules Husbands, MD;  Location: [facility] ORS;  Service:  General;  Laterality: N/A;  . OOPHORECTOMY Bilateral 2004    There were no vitals filed for this visit.  Subjective Assessment - 08/18/18 1110    Subjective  The patient demonstrates a brighter affect today            ADULT SLP TREATMENT - 08/18/18 0001      General Information   Behavior/Cognition  Alert;Cooperative;Pleasant mood    HPI  Courtney King is a 52 y.o. female: presents for f/u stroke. 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 Deficits began after surgery on 06/29/2018 but were thought to be 2/2 anesthesia. Pt has followed with neurology. Has appt with ophthamology for visual field deficits this week. Awaiting OT/ Speech eval for aphasia- slow and difficult speech since 8/13.       Treatment Provided   Treatment provided  Cognitive-Linquistic      Pain Assessment   Pain Assessment  No/denies pain      Cognitive-Linquistic Treatment   Treatment focused on  Apraxia;Aphasia    Skilled Treatment  The patient completed a variety of moderately complex cognitive-linguistic tasks (name category given 3 members, identify wrong item in list and state why, verbal analogies, word deduction) with overall 80% accuracy and 90% fluency.      Assessment / Recommendations / Plan   Plan  Continue with current plan of care      Progression Toward Goals   Progression  toward goals  Progressing toward goals       SLP Education - 08/18/18 1110    Education Details  word finding strategies    Person(s) Educated  Patient    Methods  Explanation    Comprehension  Verbalized understanding         SLP Long Term Goals - 07/30/18 1600      SLP LONG TERM GOAL #1   Title  Patient will generate grammatical, fluent, and cogent sentences to complete abstract/complex linguistic task with 80% accuracy.    Time  8    Period  Weeks    Status  New    Target Date  09/28/18      SLP LONG TERM GOAL #2   Title  Patient will complete high level word finding tasks with 80%  accuracy.    Time  8    Period  Weeks    Status  New    Target Date  09/28/18      SLP LONG TERM GOAL #3   Title  Patient will write grammatical and cogent sentence to complete abstract/complex linguistic task with 80% accuracy.    Time  8    Period  Weeks    Status  New    Target Date  09/28/18      SLP LONG TERM GOAL #4   Title  Patient will demonstrate reading comprehension for paragraphs with 80% accuracy.     Time  8    Period  Weeks    Status  New    Target Date  09/28/18       Plan - 08/18/18 1111    Clinical Impression Statement  The patient remains more fluent and  less bothered by delays in word finding.    Speech Therapy Frequency  2x / week    Duration  Other (comment)    Treatment/Interventions  Language facilitation;SLP instruction and feedback;Compensatory techniques;Cognitive reorganization;Patient/family education    Potential to Achieve Goals  Good    Potential Considerations  Ability to learn/carryover information;Pain level;Family/community support;Co-morbidities;Previous level of function;Cooperation/participation level;Severity of impairments;Medical prognosis    Consulted and Agree with Plan of Care  Patient       Patient will benefit from skilled therapeutic intervention in order to improve the following deficits and impairments:   Apraxia following nontraumatic intracerebral hemorrhage  Aphasia    Problem List Patient Active Problem List   Diagnosis Date Noted  . Diverticulitis of colon 06/21/2018  . Sigmoid diverticulitis   . Coronary artery disease 03/09/2018  . Diabetes ([place]) 03/09/2018  . Anxiety, generalized 10/20/2016  . H/O carotid endarterectomy 10/20/2016  . Hyperlipidemia, mixed 10/20/2016  . Pruritic erythematous rash 10/20/2016  . Dependence on nicotine from other tobacco product 07/11/2016  . Tobacco abuse 07/11/2016  . Family history of colon cancer 12/07/2015  . Family history of ovarian cancer 12/07/2015  . Prediabetes  12/04/2015  . Hx of endarterectomy 11/02/2015  . Essential hypertension 11/02/2015  . Hypertriglyceridemia 09/03/2015  . Adiposity 09/03/2015  . BMI 29.0-29.9,adult 07/17/2015  . Anxiety 07/17/2015  . GERD (gastroesophageal reflux disease) 07/17/2015  . Hot flashes, menopausal 07/17/2015   Leroy Sea, MS/CCC- SLP  Lou Miner 08/18/2018, 11:11 AM  [place] MAIN [facility] SERVICES [address] [place], [place], [address] Phone: [phone]   Fax:  [phone]   Name: Courtney King MRN: [id number] Date of Birth: 1966-10-29

## 2018-08-18 NOTE — Therapy (Addendum)
[place] MAIN [facility] SERVICES [address] [place], [place], [address] Phone: [phone]   Fax:  [phone]  Occupational Therapy Treatment  Patient Details  Name: Courtney King MRN: [id number] Date of Birth: 01-Jul-1966 No data recorded  Encounter Date: 08/18/2018  OT End of Session - 08/18/18 1021    Visit Number  3    Number of Visits  24    Date for OT Re-Evaluation  11/04/18    Authorization Type  [employer]    OT Start Time  0930    OT Stop Time  1015    OT Time Calculation (min)  45 min    Activity Tolerance  Patient tolerated treatment well    Behavior During Therapy  [facility] for tasks assessed/performed       Past Medical History:  Diagnosis Date  . Allergy   . Anxiety   . Diabetes mellitus without complication ([place])   . GERD (gastroesophageal reflux disease)   . H/O blood clots 2014   in abdominal aorta - endarterectomy at [facility]  . Hyperlipidemia   . Hypertension   . Left leg claudication [facility])     Past Surgical History:  Procedure Laterality Date  . ABDOMINAL HYSTERECTOMY  2004  . AORTIC ENDARTERECETOMY  2013  . CHOLECYSTECTOMY  1999  . COLONOSCOPY WITH PROPOFOL N/A 04/03/2017   Procedure: COLONOSCOPY WITH PROPOFOL;  Surgeon: Jonathon Bellows, MD;  Location: [facility] ENDOSCOPY;  Service: Endoscopy;  Laterality: N/A;  . ESOPHAGOGASTRODUODENOSCOPY (EGD) WITH PROPOFOL N/A 04/03/2017   Procedure: ESOPHAGOGASTRODUODENOSCOPY (EGD) WITH PROPOFOL;  Surgeon: Jonathon Bellows, MD;  Location: [facility] ENDOSCOPY;  Service: Endoscopy;  Laterality: N/A;  . EUS N/A 04/30/2017   Procedure: FULL UPPER ENDOSCOPIC ULTRASOUND (EUS) RADIAL;  Surgeon: Jola Schmidt, MD;  Location: [facility] ENDOSCOPY;  Service: Endoscopy;  Laterality: N/A;  . FOOT SURGERY Left    plantar fasciitis  . LAPAROSCOPIC NISSEN FUNDOPLICATION  6151   done in [place] to eliminate acid reflux  . LAPAROSCOPIC SIGMOID COLECTOMY N/A 06/29/2018   Procedure: LAPAROSCOPIC SIGMOID COLECTOMY;   Surgeon: Jules Husbands, MD;  Location: [facility] ORS;  Service: General;  Laterality: N/A;  . OOPHORECTOMY Bilateral 2004    There were no vitals filed for this visit.  Subjective Assessment - 08/18/18 1018    Subjective  Pt reports that she experiences more headaches when she is focusing on a task especially when using a computer.    Pertinent History  Patient reports she went in for surgery on August 13th to have part of her colon removed which was infected.  when she woke up, she realized something was wrong, at first they thought it was the anesthesia.  She was having trouble with her right hand, missing her mouth when trying to take meds.  When she returned to have her staples out on august 27th she had a brain scan to confirm she had a stroke.    Patient Stated Goals  "I want to be able to go back to work" , be independent     Currently in Pain?  Yes    Pain Score  6     Pain Location  Head    Pain Descriptors / Indicators  Aching    Pain Type  Acute pain    Pain Onset  1 to 4 weeks ago    Pain Frequency  Intermittent    Aggravating Factors   Concentrating on a task, especially on a computer    Pain Relieving Factors  Rest      OT TREATMENT  Self-care:  Pt completed home and medical situational judgement scenarios. Pt was able to formulate correct responses with increased time and probing questions. Pt's responses demonstrated appropriate safety awareness for home and medical scenarios. Pt was unable to write responses on the worksheet due to experiencing a headache when concentrating. Pt. Visual perceptual function was screened Pt. Was able to complete 34/36 responses accurately. Pt. missed 1/8 items in the visual memory, and 1/11 items in the visual closure section. Pt. required increasd time to complete each answer with the longest time frames being in the visual closure section. Pt. reported having a headache during the test during the visual closure  section               OT Education - 08/18/18 1020    Education Details  Visual perception, situational judgement    Person(s) Educated  Patient    Methods  Explanation    Comprehension  Verbalized understanding          OT Long Term Goals - 08/16/18 1054      OT LONG TERM GOAL #1   Title  Patient will complete hair care with modified independence.     Baseline  difficulty with reaching and completing at eval.     Time  8    Period  Weeks    Status  New    Target Date  10/05/18      OT LONG TERM GOAL #2   Title  Patient will complete shower transfers with modified independence     Baseline  supervision for safety at eval     Time  8    Period  Weeks    Status  New    Target Date  10/05/18      OT LONG TERM GOAL #3   Title  Patient will complete light meal preparation with modified independence     Baseline  unable at eval     Time  12    Period  Weeks    Status  New    Target Date  11/04/18      OT LONG TERM GOAL #4   Title  Patient will complete light housekeeping tasks with modified independence     Baseline  unable at eval     Time  12    Period  Weeks    Status  New    Target Date  11/04/18      OT LONG TERM GOAL #5   Title  Patient will complete money management with bill paying and balancing accounts with 100% accuracy     Baseline  requires min assist at eval     Time  12    Period  Weeks    Status  New    Target Date  11/04/18      Long Term Additional Goals   Additional Long Term Goals  Yes      OT LONG TERM GOAL #6   Title  Patient will tolerate 30 mins of tasks on the computer working towards work tasks without complaints of pain (headache)    Baseline  unable at eval    Time  12    Period  Weeks    Status  New    Target Date  11/04/18            Plan - 08/18/18 1022    Clinical Impression Statement Pt reports having difficulties with concentration, especially when  using the computer. Pt continues to have difficulties  with her vision and is working to incorporate visual compensatory strategies into her daily routine. Pt is working to improve her situational judgment with daily home and medical scenarios. Pt is able to come up with multiple responses with increased time and cuing.    Occupational Profile and client history currently impacting functional performance  current smoker    Occupational performance deficits (Please refer to evaluation for details):  ADL's;IADL's;Work;Social Participation    Rehab Potential  Good    Current Impairments/barriers affecting progress:  headaches with electronic devices, works full time on the computer with productivity demands, right visual deficits, impaired cognitive processing, problem solving    OT Frequency  2x / week    OT Duration  12 weeks    OT Treatment/Interventions  Self-care/ADL training;Therapeutic exercise;Visual/perceptual remediation/compensation;Moist Heat;Neuromuscular education;Patient/family education;Therapeutic activities;Manual Therapy;Cognitive remediation/compensation;DME and/or AE instruction    Clinical Decision Making  Limited treatment options, no task modification necessary    Consulted and Agree with Plan of Care  Patient       Patient will benefit from skilled therapeutic intervention in order to improve the following deficits and impairments:  Decreased cognition, Impaired vision/preception, Decreased coordination, Decreased activity tolerance, Decreased strength, Impaired UE functional use  Visit Diagnosis: Visual impairment  Cognitive communication deficit    Problem List Patient Active Problem List   Diagnosis Date Noted  . Diverticulitis of colon 06/21/2018  . Sigmoid diverticulitis   . Coronary artery disease 03/09/2018  . Diabetes ([place]) 03/09/2018  . Anxiety, generalized 10/20/2016  . H/O carotid endarterectomy 10/20/2016  . Hyperlipidemia, mixed 10/20/2016  . Pruritic erythematous rash 10/20/2016  . Dependence on  nicotine from other tobacco product 07/11/2016  . Tobacco abuse 07/11/2016  . Family history of colon cancer 12/07/2015  . Family history of ovarian cancer 12/07/2015  . Prediabetes 12/04/2015  . Hx of endarterectomy 11/02/2015  . Essential hypertension 11/02/2015  . Hypertriglyceridemia 09/03/2015  . Adiposity 09/03/2015  . BMI 29.0-29.9,adult 07/17/2015  . Anxiety 07/17/2015  . GERD (gastroesophageal reflux disease) 07/17/2015  . Hot flashes, menopausal 07/17/2015    Oliver Hum, OTS 08/18/2018, 10:30 AM   This entire session was performed under direct supervision and direction of a licensed therapist/therapist assistant . I have personally read, edited and approve of the note as written.  Harrel Carina, MS, OTR/L   [place] MAIN [facility] SERVICES [address] [place], [place], [address] Phone: [phone]   Fax:  [phone]  Name: Courtney King MRN: [id number] Date of Birth: 11-Jun-1966

## 2018-08-24 ENCOUNTER — Encounter: Payer: Self-pay | Admitting: Occupational Therapy

## 2018-08-24 ENCOUNTER — Ambulatory Visit: Payer: Managed Care, Other (non HMO) | Admitting: Occupational Therapy

## 2018-08-24 ENCOUNTER — Ambulatory Visit: Payer: 59

## 2018-08-24 DIAGNOSIS — R41841 Cognitive communication deficit: Secondary | ICD-10-CM

## 2018-08-24 DIAGNOSIS — H547 Unspecified visual loss: Secondary | ICD-10-CM | POA: Diagnosis not present

## 2018-08-24 NOTE — Therapy (Addendum)
[place] MAIN [facility] SERVICES [address] [place], [place], [address] Phone: [phone]   Fax:  [phone]  Occupational Therapy Treatment  Patient Details  Name: Courtney King MRN: [id number] Date of Birth: Apr 03, 1966 No data recorded  Encounter Date: 08/24/2018  OT End of Session - 08/24/18 1426    Visit Number  4    Number of Visits  24    Date for OT Re-Evaluation  11/04/18    Authorization Type  [employer]    OT Start Time  0930    OT Stop Time  1015    OT Time Calculation (min)  45 min    Activity Tolerance  Patient tolerated treatment well    Behavior During Therapy  [facility] for tasks assessed/performed       Past Medical History:  Diagnosis Date  . Allergy   . Anxiety   . Diabetes mellitus without complication ([place])   . GERD (gastroesophageal reflux disease)   . H/O blood clots 2014   in abdominal aorta - endarterectomy at [facility]  . Hyperlipidemia   . Hypertension   . Left leg claudication [facility])     Past Surgical History:  Procedure Laterality Date  . ABDOMINAL HYSTERECTOMY  2004  . AORTIC ENDARTERECETOMY  2013  . CHOLECYSTECTOMY  1999  . COLONOSCOPY WITH PROPOFOL N/A 04/03/2017   Procedure: COLONOSCOPY WITH PROPOFOL;  Surgeon: Jonathon Bellows, MD;  Location[facility] ENDOSCOPY;  Service: Endoscopy;  Laterality: N/A;  . ESOPHAGOGASTRODUODENOSCOPY (EGD) WITH PROPOFOL N/A 04/03/2017   Procedure: ESOPHAGOGASTRODUODENOSCOPY (EGD) WITH PROPOFOL;  Surgeon: Jonathon Bellows, MD;  Location: [facility] ENDOSCOPY;  Service: Endoscopy;  Laterality: N/A;  . EUS N/A 04/30/2017   Procedure: FULL UPPER ENDOSCOPIC ULTRASOUND (EUS) RADIAL;  Surgeon: Jola Schmidt, MD;  Location: [facility] ENDOSCOPY;  Service: Endoscopy;  Laterality: N/A;  . FOOT SURGERY Left    plantar fasciitis  . LAPAROSCOPIC NISSEN FUNDOPLICATION  6962   done in [place] to eliminate acid reflux  . LAPAROSCOPIC SIGMOID COLECTOMY N/A 06/29/2018   Procedure: LAPAROSCOPIC SIGMOID COLECTOMY;   Surgeon: Jules Husbands, MD;  Location: [facility] ORS;  Service: General;  Laterality: N/A;  . OOPHORECTOMY Bilateral 2004    There were no vitals filed for this visit.  Subjective Assessment - 08/24/18 1423    Subjective   Pt reports that she had a good weekend and that she has not noticed any changes.    Pertinent History  Patient reports she went in for surgery on August 13th to have part of her colon removed which was infected.  when she woke up, she realized something was wrong, at first they thought it was the anesthesia.  She was having trouble with her right hand, missing her mouth when trying to take meds.  When she returned to have her staples out on august 27th she had a brain scan to confirm she had a stroke.    Patient Stated Goals  "I want to be able to go back to work" , be independent     Currently in Pain?  Yes    Pain Score  5     Pain Location  Head    Pain Descriptors / Indicators  Aching    Pain Type  Acute pain    Pain Onset  1 to 4 weeks ago    Pain Frequency  Intermittent    Aggravating Factors   Concentrating on a task, especially on a computer    Pain Relieving Factors  Rest  Multiple Pain Sites  No      OT TREATMENT:  Self-care: Pt completed visual scanning activity that required her to ambulate at various paces while scanning to identify laterally placed objects. Pt required cuing to increase visual attention to the R and in higher planes. Pt missed more objects as she increased the pace of which she was walking. Pt completed visual scanning activity while seated at the table. Pt demonstrated organized scanning patterns as she scanned from left to right to find objects placed before her. Pt was able to scan to the R without cuing during tabletop task. Pt required increased time to locate items in her far R visual field. Pt completed money management task that required her to calculate change using coins. Pt completed task with 100% accuracy, however required  increased time to do so. Pt requested pen and paper to calculate answer when presented with a multi-step problem.                     OT Education - 08/24/18 1425    Education Details  Visual scanning, money management    Person(s) Educated  Patient    Methods  Explanation    Comprehension  Verbalized understanding          OT Long Term Goals - 08/16/18 1054      OT LONG TERM GOAL #1   Title  Patient will complete hair care with modified independence.     Baseline  difficulty with reaching and completing at eval.     Time  8    Period  Weeks    Status  New    Target Date  10/05/18      OT LONG TERM GOAL #2   Title  Patient will complete shower transfers with modified independence     Baseline  supervision for safety at eval     Time  8    Period  Weeks    Status  New    Target Date  10/05/18      OT LONG TERM GOAL #3   Title  Patient will complete light meal preparation with modified independence     Baseline  unable at eval     Time  12    Period  Weeks    Status  New    Target Date  11/04/18      OT LONG TERM GOAL #4   Title  Patient will complete light housekeeping tasks with modified independence     Baseline  unable at eval     Time  12    Period  Weeks    Status  New    Target Date  11/04/18      OT LONG TERM GOAL #5   Title  Patient will complete money management with bill paying and balancing accounts with 100% accuracy     Baseline  requires min assist at eval     Time  12    Period  Weeks    Status  New    Target Date  11/04/18      Long Term Additional Goals   Additional Long Term Goals  Yes      OT LONG TERM GOAL #6   Title  Patient will tolerate 30 mins of tasks on the computer working towards work tasks without complaints of pain (headache)    Baseline  unable at eval    Time  12    Period  Weeks  Status  New    Target Date  11/04/18            Plan - 08/24/18 1426    Clinical Impression Statement  Pt  continues to have difficulties visually attending to the right side. Pt continues to work to scan the full visual field, focusing to scan and attend to the right side. Pt reports requiring increased time to add and subtract money in her head. Pt worked on Comptroller with coins. Pt completed money management with 100% accuracy however required increased time to calculate sums and differences.    Occupational Profile and client history currently impacting functional performance  current smoker    Occupational performance deficits (Please refer to evaluation for details):  ADL's;IADL's;Work;Social Participation    Rehab Potential  Good    Current Impairments/barriers affecting progress:  headaches with electronic devices, works full time on the computer with productivity demands, right visual deficits, impaired cognitive processing, problem solving    OT Frequency  2x / week    OT Duration  12 weeks    OT Treatment/Interventions  Self-care/ADL training;Therapeutic exercise;Visual/perceptual remediation/compensation;Moist Heat;Neuromuscular education;Patient/family education;Therapeutic activities;Manual Therapy;Cognitive remediation/compensation;DME and/or AE instruction    Clinical Decision Making  Limited treatment options, no task modification necessary    Consulted and Agree with Plan of Care  Patient       Patient will benefit from skilled therapeutic intervention in order to improve the following deficits and impairments:  Decreased cognition, Impaired vision/preception, Decreased coordination, Decreased activity tolerance, Decreased strength, Impaired UE functional use  Visit Diagnosis: Visual impairment  Cognitive communication deficit    Problem List Patient Active Problem List   Diagnosis Date Noted  . Diverticulitis of colon 06/21/2018  . Sigmoid diverticulitis   . Coronary artery disease 03/09/2018  . Diabetes ([place]) 03/09/2018  . Anxiety, generalized 10/20/2016  . H/O  carotid endarterectomy 10/20/2016  . Hyperlipidemia, mixed 10/20/2016  . Pruritic erythematous rash 10/20/2016  . Dependence on nicotine from other tobacco product 07/11/2016  . Tobacco abuse 07/11/2016  . Family history of colon cancer 12/07/2015  . Family history of ovarian cancer 12/07/2015  . Prediabetes 12/04/2015  . Hx of endarterectomy 11/02/2015  . Essential hypertension 11/02/2015  . Hypertriglyceridemia 09/03/2015  . Adiposity 09/03/2015  . BMI 29.0-29.9,adult 07/17/2015  . Anxiety 07/17/2015  . GERD (gastroesophageal reflux disease) 07/17/2015  . Hot flashes, menopausal 07/17/2015   Amy T Lovett, OTR/L, CLT This entire session was performed under direct supervision and direction of a licensed therapist/therapist assistant . I have personally read, edited and approve of the note as written.  Oliver Hum, OTS 08/24/2018, 2:33 PM  [place] MAIN [facility] SERVICES [address] [place], [place], [address] Phone: [phone]   Fax:  [phone]  Name: Courtney King MRN: [id number] Date of Birth: Nov 19, 1965

## 2018-08-26 ENCOUNTER — Ambulatory Visit: Payer: Managed Care, Other (non HMO) | Admitting: Occupational Therapy

## 2018-08-26 ENCOUNTER — Ambulatory Visit: Payer: Managed Care, Other (non HMO) | Admitting: Speech Pathology

## 2018-08-26 ENCOUNTER — Ambulatory Visit: Payer: 59 | Admitting: Physical Therapy

## 2018-08-26 ENCOUNTER — Encounter: Payer: Self-pay | Admitting: Occupational Therapy

## 2018-08-26 DIAGNOSIS — R41841 Cognitive communication deficit: Secondary | ICD-10-CM

## 2018-08-26 DIAGNOSIS — I69190 Apraxia following nontraumatic intracerebral hemorrhage: Secondary | ICD-10-CM

## 2018-08-26 DIAGNOSIS — H547 Unspecified visual loss: Secondary | ICD-10-CM | POA: Diagnosis not present

## 2018-08-26 NOTE — Therapy (Addendum)
[place] MAIN [facility] SERVICES [address] [place], [place], [address] Phone: [phone]   Fax:  [phone]  Occupational Therapy Treatment  Patient Details  Name: Courtney King MRN: [id number] Date of Birth: 1966/10/25 No data recorded  Encounter Date: 08/26/2018  OT End of Session - 08/26/18 1558    Visit Number  5    Number of Visits  24    Date for OT Re-Evaluation  11/04/18    Authorization Type  [employer]    OT Start Time  1500    OT Stop Time  1545    OT Time Calculation (min)  45 min    Activity Tolerance  Patient tolerated treatment well    Behavior During Therapy  [facility] for tasks assessed/performed       Past Medical History:  Diagnosis Date  . Allergy   . Anxiety   . Diabetes mellitus without complication ([place])   . GERD (gastroesophageal reflux disease)   . H/O blood clots 2014   in abdominal aorta - endarterectomy at [facility]  . Hyperlipidemia   . Hypertension   . Left leg claudication [facility])     Past Surgical History:  Procedure Laterality Date  . ABDOMINAL HYSTERECTOMY  2004  . AORTIC ENDARTERECETOMY  2013  . CHOLECYSTECTOMY  1999  . COLONOSCOPY WITH PROPOFOL N/A 04/03/2017   Procedure: COLONOSCOPY WITH PROPOFOL;  Surgeon: Jonathon Bellows, MD;  Location: [facility] ENDOSCOPY;  Service: Endoscopy;  Laterality: N/A;  . ESOPHAGOGASTRODUODENOSCOPY (EGD) WITH PROPOFOL N/A 04/03/2017   Procedure: ESOPHAGOGASTRODUODENOSCOPY (EGD) WITH PROPOFOL;  Surgeon: Jonathon Bellows, MD;  Location: [facility] ENDOSCOPY;  Service: Endoscopy;  Laterality: N/A;  . EUS N/A 04/30/2017   Procedure: FULL UPPER ENDOSCOPIC ULTRASOUND (EUS) RADIAL;  Surgeon: Jola Schmidt, MD;  Location: [facility] ENDOSCOPY;  Service: Endoscopy;  Laterality: N/A;  . FOOT SURGERY Left    plantar fasciitis  . LAPAROSCOPIC NISSEN FUNDOPLICATION  7893   done in [place] to eliminate acid reflux  . LAPAROSCOPIC SIGMOID COLECTOMY N/A 06/29/2018   Procedure: LAPAROSCOPIC SIGMOID COLECTOMY;   Surgeon: Jules Husbands, MD;  Location: [facility] ORS;  Service: General;  Laterality: N/A;  . OOPHORECTOMY Bilateral 2004    There were no vitals filed for this visit.  Subjective Assessment - 08/26/18 1555    Subjective   Pt reports that she is doing well and that she is ready to try using the computer again.    Pertinent History  Patient reports she went in for surgery on August 13th to have part of her colon removed which was infected.  when she woke up, she realized something was wrong, at first they thought it was the anesthesia.  She was having trouble with her right hand, missing her mouth when trying to take meds.  When she returned to have her staples out on august 27th she had a brain scan to confirm she had a stroke.    Patient Stated Goals  "I want to be able to go back to work" , be independent     Currently in Pain?  Yes    Pain Score  7     Pain Location  Head   Headache   Pain Orientation  Mid    Pain Descriptors / Indicators  Aching    Pain Type  Acute pain    Pain Onset  More than a month ago    Pain Frequency  Intermittent    Aggravating Factors   Concentrating on a task, especially  on a computer    Pain Relieving Factors  Rest    Multiple Pain Sites  No      OT TREATMENT  Self-care: Pt completed visual scanning course with a focus on divided attention. Pt was instructed to ambulate down the hall while visually scanning to the L and R to identify cards placed at various heights on the wall. Pt demonstrated increased omissions of cards in her R visual field. Pt demonstrated increased omissions when identifying specific cards (ex: even numbers only).  Pt completed money management task that required her to compute sums and differences using coins and dollar bill manipulatives. Pt required increased time to compute answers. Pt demonstrated increased errors today, as pt was given multi-step computations. Pt reported experiencing a headache as the task  continued.                     OT Education - 08/26/18 1557    Education Details  visual scanning, money managment    Person(s) Educated  Patient    Methods  Explanation;Demonstration    Comprehension  Verbalized understanding;Returned demonstration;Need further instruction          OT Long Term Goals - 08/16/18 1054      OT LONG TERM GOAL #1   Title  Patient will complete hair care with modified independence.     Baseline  difficulty with reaching and completing at eval.     Time  8    Period  Weeks    Status  New    Target Date  10/05/18      OT LONG TERM GOAL #2   Title  Patient will complete shower transfers with modified independence     Baseline  supervision for safety at eval     Time  8    Period  Weeks    Status  New    Target Date  10/05/18      OT LONG TERM GOAL #3   Title  Patient will complete light meal preparation with modified independence     Baseline  unable at eval     Time  12    Period  Weeks    Status  New    Target Date  11/04/18      OT LONG TERM GOAL #4   Title  Patient will complete light housekeeping tasks with modified independence     Baseline  unable at eval     Time  12    Period  Weeks    Status  New    Target Date  11/04/18      OT LONG TERM GOAL #5   Title  Patient will complete money management with bill paying and balancing accounts with 100% accuracy     Baseline  requires min assist at eval     Time  12    Period  Weeks    Status  New    Target Date  11/04/18      Long Term Additional Goals   Additional Long Term Goals  Yes      OT LONG TERM GOAL #6   Title  Patient will tolerate 30 mins of tasks on the computer working towards work tasks without complaints of pain (headache)    Baseline  unable at eval    Time  12    Period  Weeks    Status  New    Target Date  11/04/18  Plan - 08/26/18 1558    Clinical Impression Statement  Pt contiues to have headaches when visually  attending and focusing on a task. Pt contiues to work on visually scanning especially to the R side. Pt is able to scan while only turning her head with increased accuracy. Pt continues to have difficulties computing money in her head. Pt completed money management task today with increased errors and increased time. Pt continues to work to improve money management and visual scanning to promote independence and safety during ADLs, IADLs, and upon returning to work.    Occupational Profile and client history currently impacting functional performance  current smoker    Occupational performance deficits (Please refer to evaluation for details):  ADL's;IADL's;Work;Social Participation    Rehab Potential  Good    Current Impairments/barriers affecting progress:  headaches with electronic devices, works full time on the computer with productivity demands, right visual deficits, impaired cognitive processing, problem solving    OT Frequency  2x / week    OT Duration  12 weeks    OT Treatment/Interventions  Self-care/ADL training;Therapeutic exercise;Visual/perceptual remediation/compensation;Moist Heat;Neuromuscular education;Patient/family education;Therapeutic activities;Manual Therapy;Cognitive remediation/compensation;DME and/or AE instruction    Clinical Decision Making  Limited treatment options, no task modification necessary    Consulted and Agree with Plan of Care  Patient       Patient will benefit from skilled therapeutic intervention in order to improve the following deficits and impairments:  Decreased cognition, Impaired vision/preception, Decreased coordination, Decreased activity tolerance, Decreased strength, Impaired UE functional use  Visit Diagnosis: Visual impairment  Cognitive communication deficit    Problem List Patient Active Problem List   Diagnosis Date Noted  . Diverticulitis of colon 06/21/2018  . Sigmoid diverticulitis   . Coronary artery disease 03/09/2018  .  Diabetes ([place]) 03/09/2018  . Anxiety, generalized 10/20/2016  . H/O carotid endarterectomy 10/20/2016  . Hyperlipidemia, mixed 10/20/2016  . Pruritic erythematous rash 10/20/2016  . Dependence on nicotine from other tobacco product 07/11/2016  . Tobacco abuse 07/11/2016  . Family history of colon cancer 12/07/2015  . Family history of ovarian cancer 12/07/2015  . Prediabetes 12/04/2015  . Hx of endarterectomy 11/02/2015  . Essential hypertension 11/02/2015  . Hypertriglyceridemia 09/03/2015  . Adiposity 09/03/2015  . BMI 29.0-29.9,adult 07/17/2015  . Anxiety 07/17/2015  . GERD (gastroesophageal reflux disease) 07/17/2015  . Hot flashes, menopausal 07/17/2015    Oliver Hum, OTS 08/26/2018, 4:04 PM   This entire session was performed under direct supervision and direction of a licensed therapist/therapist assistant.  I have personally read, edited and approve of the note as written.  Chrys Racer, OTR/L ascom [phone] 08/26/18, 4:21 PM    [place] MAIN [facility] SERVICES [address] [place], [place], [address] Phone: [phone]   Fax:  [phone]  Name: Courtney King MRN: [id number] Date of Birth: 1966-01-25

## 2018-08-27 ENCOUNTER — Encounter: Payer: Self-pay | Admitting: Speech Pathology

## 2018-08-27 NOTE — Therapy (Signed)
[place] MAIN [facility] SERVICES [address] [place], [place], [address] Phone: [phone]   Fax:  [phone]  Speech Language Pathology Treatment  Patient Details  Name: Courtney King MRN: [id number] Date of Birth: [birth year] Referring Provider (SLP): [place], [place]    Encounter Date: 08/26/2018  End of Session - 08/27/18 1234    Visit Number  6    Number of Visits  17    Date for SLP Re-Evaluation  09/28/18    SLP Start Time  1600    SLP Stop Time   1645    SLP Time Calculation (min)  45 min    Activity Tolerance  Patient tolerated treatment well;Patient limited by pain       Past Medical History:  Diagnosis Date  . Allergy   . Anxiety   . Diabetes mellitus without complication ([place])   . GERD (gastroesophageal reflux disease)   . H/O blood clots 2014   in abdominal aorta - endarterectomy at [facility]  . Hyperlipidemia   . Hypertension   . Left leg claudication [facility])     Past Surgical History:  Procedure Laterality Date  . ABDOMINAL HYSTERECTOMY  2004  . AORTIC ENDARTERECETOMY  2013  . CHOLECYSTECTOMY  1999  . COLONOSCOPY WITH PROPOFOL N/A 04/03/2017   Procedure: COLONOSCOPY WITH PROPOFOL;  Surgeon: Jonathon Bellows, MD;  Location: [facility] ENDOSCOPY;  Service: Endoscopy;  Laterality: N/A;  . ESOPHAGOGASTRODUODENOSCOPY (EGD) WITH PROPOFOL N/A 04/03/2017   Procedure: ESOPHAGOGASTRODUODENOSCOPY (EGD) WITH PROPOFOL;  Surgeon: Jonathon Bellows, MD;  Location: [facility] ENDOSCOPY;  Service: Endoscopy;  Laterality: N/A;  . EUS N/A 04/30/2017   Procedure: FULL UPPER ENDOSCOPIC ULTRASOUND (EUS) RADIAL;  Surgeon: Jola Schmidt, MD;  Location: [facility] ENDOSCOPY;  Service: Endoscopy;  Laterality: N/A;  . FOOT SURGERY Left    plantar fasciitis  . LAPAROSCOPIC NISSEN FUNDOPLICATION  2671   done in [place] to eliminate acid reflux  . LAPAROSCOPIC SIGMOID COLECTOMY N/A 06/29/2018   Procedure: LAPAROSCOPIC SIGMOID COLECTOMY;  Surgeon: Jules Husbands, MD;   Location: [facility] ORS;  Service: General;  Laterality: N/A;  . OOPHORECTOMY Bilateral 2004    There were no vitals filed for this visit.  Subjective Assessment - 08/27/18 1233    Subjective  The patient reports increased headache after working with OT            ADULT SLP TREATMENT - 08/27/18 0001      General Information   Behavior/Cognition  Alert;Cooperative;Pleasant mood    HPI  Courtney King is a 52 y.o. female: presents for f/u stroke. 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 Deficits began after surgery on 06/29/2018 but were thought to be 2/2 anesthesia. Pt has followed with neurology. Has appt with ophthamology for visual field deficits this week. Awaiting OT/ Speech eval for aphasia- slow and difficult speech since 8/13.       Treatment Provided   Treatment provided  Cognitive-Linquistic      Pain Assessment   Pain Assessment  No/denies pain      Cognitive-Linquistic Treatment   Treatment focused on  Apraxia;Aphasia    Skilled Treatment  The patient completed a variety of moderately complex cognitive-linguistic tasks (name category given 3 members, identify wrong item in list and state why, verbal analogies, word deduction, semantic feature analysis) with overall 80% accuracy and 90% fluency.      Assessment / Recommendations / Plan   Plan  Continue with current plan of care  Progression Toward Goals   Progression toward goals  Progressing toward goals       SLP Education - 08/27/18 1233    Education Details  word finding strategies    Person(s) Educated  Patient    Methods  Explanation    Comprehension  Verbalized understanding         SLP Long Term Goals - 07/30/18 1600      SLP LONG TERM GOAL #1   Title  Patient will generate grammatical, fluent, and cogent sentences to complete abstract/complex linguistic task with 80% accuracy.    Time  8    Period  Weeks    Status  New    Target Date  09/28/18      SLP LONG TERM GOAL #2    Title  Patient will complete high level word finding tasks with 80% accuracy.    Time  8    Period  Weeks    Status  New    Target Date  09/28/18      SLP LONG TERM GOAL #3   Title  Patient will write grammatical and cogent sentence to complete abstract/complex linguistic task with 80% accuracy.    Time  8    Period  Weeks    Status  New    Target Date  09/28/18      SLP LONG TERM GOAL #4   Title  Patient will demonstrate reading comprehension for paragraphs with 80% accuracy.     Time  8    Period  Weeks    Status  New    Target Date  09/28/18       Plan - 08/27/18 1234    Clinical Impression Statement  The patient remains more fluent and  less bothered by delays in word finding.  Patient reports having a headache after working with OT earlier.       Patient will benefit from skilled therapeutic intervention in order to improve the following deficits and impairments:   Cognitive communication deficit  Apraxia following nontraumatic intracerebral hemorrhage    Problem List Patient Active Problem List   Diagnosis Date Noted  . Diverticulitis of colon 06/21/2018  . Sigmoid diverticulitis   . Coronary artery disease 03/09/2018  . Diabetes ([place]) 03/09/2018  . Anxiety, generalized 10/20/2016  . H/O carotid endarterectomy 10/20/2016  . Hyperlipidemia, mixed 10/20/2016  . Pruritic erythematous rash 10/20/2016  . Dependence on nicotine from other tobacco product 07/11/2016  . Tobacco abuse 07/11/2016  . Family history of colon cancer 12/07/2015  . Family history of ovarian cancer 12/07/2015  . Prediabetes 12/04/2015  . Hx of endarterectomy 11/02/2015  . Essential hypertension 11/02/2015  . Hypertriglyceridemia 09/03/2015  . Adiposity 09/03/2015  . BMI 29.0-29.9,adult 07/17/2015  . Anxiety 07/17/2015  . GERD (gastroesophageal reflux disease) 07/17/2015  . Hot flashes, menopausal 07/17/2015   Leroy Sea, MS/CCC- SLP  Lou Miner 08/27/2018, 12:35  PM  [place] MAIN [facility] SERVICES [address] [place], [place], [address] Phone: [phone]   Fax:  [phone]   Name: LEMPI EDWIN MRN: [id number] Date of Birth: 1966-04-24

## 2018-08-30 ENCOUNTER — Ambulatory Visit: Payer: Managed Care, Other (non HMO) | Admitting: Speech Pathology

## 2018-08-30 ENCOUNTER — Ambulatory Visit: Payer: 59 | Admitting: Physical Therapy

## 2018-08-30 ENCOUNTER — Encounter: Payer: Self-pay | Admitting: Occupational Therapy

## 2018-08-30 ENCOUNTER — Ambulatory Visit: Payer: Managed Care, Other (non HMO) | Admitting: Occupational Therapy

## 2018-08-30 DIAGNOSIS — H547 Unspecified visual loss: Secondary | ICD-10-CM | POA: Diagnosis not present

## 2018-08-30 DIAGNOSIS — R41841 Cognitive communication deficit: Secondary | ICD-10-CM

## 2018-08-30 DIAGNOSIS — I69190 Apraxia following nontraumatic intracerebral hemorrhage: Secondary | ICD-10-CM

## 2018-08-30 NOTE — Therapy (Addendum)
[place] MAIN [facility] SERVICES [address] [place], [place], [address] Phone: [phone]   Fax:  [phone]  Occupational Therapy Treatment  Patient Details  Name: Courtney King MRN: [id number] Date of Birth: 10/08/1966 No data recorded  Encounter Date: 08/30/2018  OT End of Session - 08/30/18 1559    Visit Number  6    Number of Visits  24    Date for OT Re-Evaluation  11/04/18    Authorization Type  [employer]    OT Start Time  1500    OT Stop Time  1545    OT Time Calculation (min)  45 min    Activity Tolerance  Patient tolerated treatment well    Behavior During Therapy  [facility] for tasks assessed/performed       Past Medical History:  Diagnosis Date  . Allergy   . Anxiety   . Diabetes mellitus without complication ([place])   . GERD (gastroesophageal reflux disease)   . H/O blood clots 2014   in abdominal aorta - endarterectomy at [facility]  . Hyperlipidemia   . Hypertension   . Left leg claudication [facility])     Past Surgical History:  Procedure Laterality Date  . ABDOMINAL HYSTERECTOMY  2004  . AORTIC ENDARTERECETOMY  2013  . CHOLECYSTECTOMY  1999  . COLONOSCOPY WITH PROPOFOL N/A 04/03/2017   Procedure: COLONOSCOPY WITH PROPOFOL;  Surgeon: Jonathon Bellows, MD;  Location: [facility] ENDOSCOPY;  Service: Endoscopy;  Laterality: N/A;  . ESOPHAGOGASTRODUODENOSCOPY (EGD) WITH PROPOFOL N/A 04/03/2017   Procedure: ESOPHAGOGASTRODUODENOSCOPY (EGD) WITH PROPOFOL;  Surgeon: Jonathon Bellows, MD;  Location: [facility] ENDOSCOPY;  Service: Endoscopy;  Laterality: N/A;  . EUS N/A 04/30/2017   Procedure: FULL UPPER ENDOSCOPIC ULTRASOUND (EUS) RADIAL;  Surgeon: Jola Schmidt, MD;  Location: [facility] ENDOSCOPY;  Service: Endoscopy;  Laterality: N/A;  . FOOT SURGERY Left    plantar fasciitis  . LAPAROSCOPIC NISSEN FUNDOPLICATION  5284   done in [place] to eliminate acid reflux  . LAPAROSCOPIC SIGMOID COLECTOMY N/A 06/29/2018   Procedure: LAPAROSCOPIC SIGMOID COLECTOMY;   Surgeon: Jules Husbands, MD;  Location: [facility] ORS;  Service: General;  Laterality: N/A;  . OOPHORECTOMY Bilateral 2004    There were no vitals filed for this visit.  Subjective Assessment - 08/30/18 1557    Subjective   Pt reports that she had a relaxing weekend and got an opportunity to use the computer at home.    Pertinent History  Patient reports she went in for surgery on August 13th to have part of her colon removed which was infected.  when she woke up, she realized something was wrong, at first they thought it was the anesthesia.  She was having trouble with her right hand, missing her mouth when trying to take meds.  When she returned to have her staples out on august 27th she had a brain scan to confirm she had a stroke.    Patient Stated Goals  "I want to be able to go back to work" , be independent     Currently in Pain?  Yes    Pain Score  7     Pain Location  Head    Pain Orientation  Mid    Pain Descriptors / Indicators  Aching    Pain Type  Acute pain    Pain Onset  More than a month ago    Pain Frequency  Intermittent    Aggravating Factors   Concentrating on computer screen  Pain Relieving Factors  Rest    Multiple Pain Sites  No      OT TREATMENT  Self-care:  Pt completed computer tasks using a blue screen overlay. Pt completed visual memory tasks that required her to look at a static picture for 30 seconds and report what she remembered. Pt was able to increase the number of items that she remembered each time. Pt reported a headache of 2/10 for the duration of the activity. Pt was instructed to rest her eyes between each task. Pt spends ~9 hours using the computer when working.  Pt completed 3 trials of typing exercises that required her to use the standard keyboard as well as the 10-key key pad. Pt uses 10-key key pad for majority of work tasks. Pt demonstrated increased typing speed (11wpm, 13 wpm, 17 wpm) and accuracy (95%, 96%, 100%) with each trial. Pt reported  a headache of 7/10 after completing typing tasks.                  OT Education - 08/30/18 1559    Education Details  Visual compensatory strategies while using computer    Person(s) Educated  Patient    Methods  Explanation;Demonstration    Comprehension  Verbalized understanding;Returned demonstration;Need further instruction          OT Long Term Goals - 08/16/18 1054      OT LONG TERM GOAL #1   Title  Patient will complete hair care with modified independence.     Baseline  difficulty with reaching and completing at eval.     Time  8    Period  Weeks    Status  New    Target Date  10/05/18      OT LONG TERM GOAL #2   Title  Patient will complete shower transfers with modified independence     Baseline  supervision for safety at eval     Time  8    Period  Weeks    Status  New    Target Date  10/05/18      OT LONG TERM GOAL #3   Title  Patient will complete light meal preparation with modified independence     Baseline  unable at eval     Time  12    Period  Weeks    Status  New    Target Date  11/04/18      OT LONG TERM GOAL #4   Title  Patient will complete light housekeeping tasks with modified independence     Baseline  unable at eval     Time  12    Period  Weeks    Status  New    Target Date  11/04/18      OT LONG TERM GOAL #5   Title  Patient will complete money management with bill paying and balancing accounts with 100% accuracy     Baseline  requires min assist at eval     Time  12    Period  Weeks    Status  New    Target Date  11/04/18      Long Term Additional Goals   Additional Long Term Goals  Yes      OT LONG TERM GOAL #6   Title  Patient will tolerate 30 mins of tasks on the computer working towards work tasks without complaints of pain (headache)    Baseline  unable at eval    Time  12  Period  Weeks    Status  New    Target Date  11/04/18            Plan - 08/30/18 1559    Clinical Impression Statement   Pt contiues to develop headaches when using computer. Pt continues to develop visual compensatory strategies while using the computer. Pt was able to use computer for 20 minutes with a minor headache, 2/10. Pt reports increased headaches when focusing on moving stimuli compared to static stimuli. Pt completed typing using standard keypad and 10-key keypad for 3 trials. Pt's accuracy and typing speed increased as the task continued.     Occupational Profile and client history currently impacting functional performance  current smoker    Occupational performance deficits (Please refer to evaluation for details):  ADL's;IADL's;Work;Social Participation    Rehab Potential  Good    Current Impairments/barriers affecting progress:  headaches with electronic devices, works full time on the computer with productivity demands, right visual deficits, impaired cognitive processing, problem solving    OT Frequency  2x / week    OT Duration  12 weeks    OT Treatment/Interventions  Self-care/ADL training;Therapeutic exercise;Visual/perceptual remediation/compensation;Moist Heat;Neuromuscular education;Patient/family education;Therapeutic activities;Manual Therapy;Cognitive remediation/compensation;DME and/or AE instruction    Clinical Decision Making  Limited treatment options, no task modification necessary    Consulted and Agree with Plan of Care  Patient       Patient will benefit from skilled therapeutic intervention in order to improve the following deficits and impairments:  Decreased cognition, Impaired vision/preception, Decreased coordination, Decreased activity tolerance, Decreased strength, Impaired UE functional use  Visit Diagnosis: Visual impairment    Problem List Patient Active Problem List   Diagnosis Date Noted  . Diverticulitis of colon 06/21/2018  . Sigmoid diverticulitis   . Coronary artery disease 03/09/2018  . Diabetes ([place]) 03/09/2018  . Anxiety, generalized 10/20/2016  . H/O  carotid endarterectomy 10/20/2016  . Hyperlipidemia, mixed 10/20/2016  . Pruritic erythematous rash 10/20/2016  . Dependence on nicotine from other tobacco product 07/11/2016  . Tobacco abuse 07/11/2016  . Family history of colon cancer 12/07/2015  . Family history of ovarian cancer 12/07/2015  . Prediabetes 12/04/2015  . Hx of endarterectomy 11/02/2015  . Essential hypertension 11/02/2015  . Hypertriglyceridemia 09/03/2015  . Adiposity 09/03/2015  . BMI 29.0-29.9,adult 07/17/2015  . Anxiety 07/17/2015  . GERD (gastroesophageal reflux disease) 07/17/2015  . Hot flashes, menopausal 07/17/2015    Oliver Hum, OTS 08/30/2018, 4:03 PM   This entire session was performed under direct supervision and direction of a licensed therapist/therapist assistant.  I have personally read, edited and approve of the note as written.  Chrys Racer, OTR/L ascom [phone] 08/30/18, 4:23 PM   [place] MAIN [facility] SERVICES [address] [place], [place], [address] Phone: [phone]   Fax:  [phone]  Name: JACKELYNN HOSIE MRN: [id number] Date of Birth: [birth year]

## 2018-08-31 ENCOUNTER — Encounter: Payer: Self-pay | Admitting: Speech Pathology

## 2018-08-31 NOTE — Therapy (Signed)
[place] MAIN [facility] SERVICES [address] [place], [place], [address] Phone: [phone]   Fax:  [phone]  Speech Language Pathology Treatment  Patient Details  Name: Courtney King MRN: [id number] Date of Birth: 1966-01-17 Referring Provider (SLP): [place], [place]    Encounter Date: 08/30/2018  End of Session - 08/31/18 0849    Visit Number  7    Number of Visits  17    Date for SLP Re-Evaluation  09/28/18    SLP Start Time  1600    SLP Stop Time   1645    SLP Time Calculation (min)  45 min    Activity Tolerance  Patient tolerated treatment well;Patient limited by pain       Past Medical History:  Diagnosis Date  . Allergy   . Anxiety   . Diabetes mellitus without complication ([place])   . GERD (gastroesophageal reflux disease)   . H/O blood clots 2014   in abdominal aorta - endarterectomy at [facility]  . Hyperlipidemia   . Hypertension   . Left leg claudication [facility])     Past Surgical History:  Procedure Laterality Date  . ABDOMINAL HYSTERECTOMY  2004  . AORTIC ENDARTERECETOMY  2013  . CHOLECYSTECTOMY  1999  . COLONOSCOPY WITH PROPOFOL N/A 04/03/2017   Procedure: COLONOSCOPY WITH PROPOFOL;  Surgeon: Jonathon Bellows, MD;  Location: [facility] ENDOSCOPY;  Service: Endoscopy;  Laterality: N/A;  . ESOPHAGOGASTRODUODENOSCOPY (EGD) WITH PROPOFOL N/A 04/03/2017   Procedure: ESOPHAGOGASTRODUODENOSCOPY (EGD) WITH PROPOFOL;  Surgeon: Jonathon Bellows, MD;  Location: [facility] ENDOSCOPY;  Service: Endoscopy;  Laterality: N/A;  . EUS N/A 04/30/2017   Procedure: FULL UPPER ENDOSCOPIC ULTRASOUND (EUS) RADIAL;  Surgeon: Jola Schmidt, MD;  Location: [facility] ENDOSCOPY;  Service: Endoscopy;  Laterality: N/A;  . FOOT SURGERY Left    plantar fasciitis  . LAPAROSCOPIC NISSEN FUNDOPLICATION  1324   done in [place] to eliminate acid reflux  . LAPAROSCOPIC SIGMOID COLECTOMY N/A 06/29/2018   Procedure: LAPAROSCOPIC SIGMOID COLECTOMY;  Surgeon: Jules Husbands, MD;   Location: [facility] ORS;  Service: General;  Laterality: N/A;  . OOPHORECTOMY Bilateral 2004    There were no vitals filed for this visit.  Subjective Assessment - 08/31/18 0849    Subjective  The patient reports increased headache after working with OT and concentration            ADULT SLP TREATMENT - 08/31/18 0001      General Information   Behavior/Cognition  Alert;Cooperative;Pleasant mood    HPI  Courtney King is a 52 y.o. female: presents for f/u stroke. 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 Deficits began after surgery on 06/29/2018 but were thought to be 2/2 anesthesia. Pt has followed with neurology. Has appt with ophthamology for visual field deficits this week. Awaiting OT/ Speech eval for aphasia- slow and difficult speech since 8/13.       Treatment Provided   Treatment provided  Cognitive-Linquistic      Pain Assessment   Pain Assessment  No/denies pain      Cognitive-Linquistic Treatment   Treatment focused on  Apraxia;Aphasia    Skilled Treatment  The patient completed a variety of moderately complex cognitive-linguistic tasks (name category given 3 members, identify wrong item in list and state why, verbal analogies, word deduction, semantic feature analysis) with overall 80% accuracy and 90% fluency.      Assessment / Recommendations / Plan   Plan  Continue with current plan of care  Progression Toward Goals   Progression toward goals  Progressing toward goals       SLP Education - 08/31/18 0849    Education Details  word finding strategies    Person(s) Educated  Patient    Methods  Explanation    Comprehension  Verbalized understanding         SLP Long Term Goals - 07/30/18 1600      SLP LONG TERM GOAL #1   Title  Patient will generate grammatical, fluent, and cogent sentences to complete abstract/complex linguistic task with 80% accuracy.    Time  8    Period  Weeks    Status  New    Target Date  09/28/18      SLP  LONG TERM GOAL #2   Title  Patient will complete high level word finding tasks with 80% accuracy.    Time  8    Period  Weeks    Status  New    Target Date  09/28/18      SLP LONG TERM GOAL #3   Title  Patient will write grammatical and cogent sentence to complete abstract/complex linguistic task with 80% accuracy.    Time  8    Period  Weeks    Status  New    Target Date  09/28/18      SLP LONG TERM GOAL #4   Title  Patient will demonstrate reading comprehension for paragraphs with 80% accuracy.     Time  8    Period  Weeks    Status  New    Target Date  09/28/18       Plan - 08/31/18 0850    Clinical Impression Statement  The patient remains more fluent and  less bothered by delays in word finding.  Patient reports having a headache after working with OT earlier and any time she has to concentrate.    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 Diverticulitis of colon 06/21/2018  . Sigmoid diverticulitis   . Coronary artery disease 03/09/2018  . Diabetes ([place]) 03/09/2018  . Anxiety, generalized 10/20/2016  . H/O carotid endarterectomy 10/20/2016  . Hyperlipidemia, mixed 10/20/2016  . Pruritic  erythematous rash 10/20/2016  . Dependence on nicotine from other tobacco product 07/11/2016  . Tobacco abuse 07/11/2016  . Family history of colon cancer 12/07/2015  . Family history of ovarian cancer 12/07/2015  . Prediabetes 12/04/2015  . Hx of endarterectomy 11/02/2015  . Essential hypertension 11/02/2015  . Hypertriglyceridemia 09/03/2015  . Adiposity 09/03/2015  . BMI 29.0-29.9,adult 07/17/2015  . Anxiety 07/17/2015  . GERD (gastroesophageal reflux disease) 07/17/2015  . Hot flashes, menopausal 07/17/2015   Leroy Sea, MS/CCC- SLP  Lou Miner 08/31/2018, 8:51 AM  [place] MAIN [facility] SERVICES [address] [place], [place], [address] Phone: [phone]  Fax:  [phone]   Name: Courtney King MRN: [id number] Date of Birth: 28-Oct-1966

## 2018-09-02 ENCOUNTER — Ambulatory Visit: Payer: Managed Care, Other (non HMO) | Admitting: Occupational Therapy

## 2018-09-02 ENCOUNTER — Ambulatory Visit: Payer: Managed Care, Other (non HMO) | Admitting: Speech Pathology

## 2018-09-02 ENCOUNTER — Encounter: Payer: Self-pay | Admitting: Occupational Therapy

## 2018-09-02 ENCOUNTER — Ambulatory Visit: Payer: 59

## 2018-09-02 DIAGNOSIS — H547 Unspecified visual loss: Secondary | ICD-10-CM

## 2018-09-02 DIAGNOSIS — I69190 Apraxia following nontraumatic intracerebral hemorrhage: Secondary | ICD-10-CM

## 2018-09-02 DIAGNOSIS — R41841 Cognitive communication deficit: Secondary | ICD-10-CM

## 2018-09-02 NOTE — Therapy (Addendum)
[place] MAIN [facility] SERVICES [address] [place], [place], [address] Phone: [phone]   Fax:  [phone]  Occupational Therapy Treatment  Patient Details  Name: Courtney King MRN: [id number] Date of Birth: July 09, 1966 No data recorded  Encounter Date: 09/02/2018  OT End of Session - 09/02/18 1602    Visit Number  7    Number of Visits  24    Date for OT Re-Evaluation  11/04/18    Authorization Type  [employer]    OT Start Time  1500    OT Stop Time  1545    OT Time Calculation (min)  45 min    Activity Tolerance  Patient tolerated treatment well    Behavior During Therapy  [facility] for tasks assessed/performed       Past Medical History:  Diagnosis Date  . Allergy   . Anxiety   . Diabetes mellitus without complication ([place])   . GERD (gastroesophageal reflux disease)   . H/O blood clots 2014   in abdominal aorta - endarterectomy at [facility]  . Hyperlipidemia   . Hypertension   . Left leg claudication [facility])     Past Surgical History:  Procedure Laterality Date  . ABDOMINAL HYSTERECTOMY  2004  . AORTIC ENDARTERECETOMY  2013  . CHOLECYSTECTOMY  1999  . COLONOSCOPY WITH PROPOFOL N/A 04/03/2017   Procedure: COLONOSCOPY WITH PROPOFOL;  Surgeon: Jonathon Bellows, MD;  Location: [facility] ENDOSCOPY;  Service: Endoscopy;  Laterality: N/A;  . ESOPHAGOGASTRODUODENOSCOPY (EGD) WITH PROPOFOL N/A 04/03/2017   Procedure: ESOPHAGOGASTRODUODENOSCOPY (EGD) WITH PROPOFOL;  Surgeon: Jonathon Bellows, MD;  Location: [facility] ENDOSCOPY;  Service: Endoscopy;  Laterality: N/A;  . EUS N/A 04/30/2017   Procedure: FULL UPPER ENDOSCOPIC ULTRASOUND (EUS) RADIAL;  Surgeon: Jola Schmidt, MD;  Location: [facility] ENDOSCOPY;  Service: Endoscopy;  Laterality: N/A;  . FOOT SURGERY Left    plantar fasciitis  . LAPAROSCOPIC NISSEN FUNDOPLICATION  1607   done in [place] to eliminate acid reflux  . LAPAROSCOPIC SIGMOID COLECTOMY N/A 06/29/2018   Procedure: LAPAROSCOPIC SIGMOID COLECTOMY;   Surgeon: Jules Husbands, MD;  Location: [facility] ORS;  Service: General;  Laterality: N/A;  . OOPHORECTOMY Bilateral 2004    There were no vitals filed for this visit.  Subjective Assessment - 09/02/18 1600    Subjective   Pt reports that she has been shopping around for screen overlays and really wants to return to work.    Pertinent History  Patient reports she went in for surgery on August 13th to have part of her colon removed which was infected.  when she woke up, she realized something was wrong, at first they thought it was the anesthesia.  She was having trouble with her right hand, missing her mouth when trying to take meds.  When she returned to have her staples out on august 27th she had a brain scan to confirm she had a stroke.    Patient Stated Goals  "I want to be able to go back to work" , be independent     Currently in Pain?  Yes    Pain Score  6     Pain Location  Head    Pain Orientation  Left    Pain Descriptors / Indicators  Aching    Pain Type  Neuropathic pain    Pain Onset  More than a month ago    Pain Frequency  Intermittent    Aggravating Factors   Concentrating on computer screen  Pain Relieving Factors  Rest    Multiple Pain Sites  No       OT TREATMENT  Self care/I ADLS: Pt completed 10 key keyboarding while using a yellow screen overlay. Pt reported no pain at the start of treatment. After 5 mins of keyboarding, pt reported experiencing a headache of 3/10. Pt was educated on visual compensatory strategies to minimize the pain. Pt completed x3 trials of keyboarding and demonstrated increased typing speed and accuracy with each trial (17wpm with 95%, 17wpm with 95%, 20wpm with 100%). Pt then completed keyboard with vision occluded which closely resembles work environment. Pt reported 6/10 headache at the end of treatment.                  OT Education - 09/02/18 1601    Education Details  Visual compensatory strategies while using computer     Person(s) Educated  Patient    Methods  Explanation;Demonstration    Comprehension  Verbalized understanding;Returned demonstration;Need further instruction          OT Long Term Goals - 08/16/18 1054      OT LONG TERM GOAL #1   Title  Patient will complete hair care with modified independence.     Baseline  difficulty with reaching and completing at eval.     Time  8    Period  Weeks    Status  New    Target Date  10/05/18      OT LONG TERM GOAL #2   Title  Patient will complete shower transfers with modified independence     Baseline  supervision for safety at eval     Time  8    Period  Weeks    Status  New    Target Date  10/05/18      OT LONG TERM GOAL #3   Title  Patient will complete light meal preparation with modified independence     Baseline  unable at eval     Time  12    Period  Weeks    Status  New    Target Date  11/04/18      OT LONG TERM GOAL #4   Title  Patient will complete light housekeeping tasks with modified independence     Baseline  unable at eval     Time  12    Period  Weeks    Status  New    Target Date  11/04/18      OT LONG TERM GOAL #5   Title  Patient will complete money management with bill paying and balancing accounts with 100% accuracy     Baseline  requires min assist at eval     Time  12    Period  Weeks    Status  New    Target Date  11/04/18      Long Term Additional Goals   Additional Long Term Goals  Yes      OT LONG TERM GOAL #6   Title  Patient will tolerate 30 mins of tasks on the computer working towards work tasks without complaints of pain (headache)    Baseline  unable at eval    Time  12    Period  Weeks    Status  New    Target Date  11/04/18            Plan - 09/02/18 1602    Clinical Impression Statement  Pt continues to develop headaches when  using the computer. Pt continues to utilize and develop visual compensatory strategies to minimize the headache while maximizing the amount of time spent  using the computer. Pt experienced a 3/10 headache when completing 10 key keyboarding after 5 mins. Pt's headache increased to a 6/10 by the end of treatment. Visual compensatory strategies while using the computer will maximize pt's performance in order to ultimately return to work.    Occupational Profile and client history currently impacting functional performance  current smoker    Occupational performance deficits (Please refer to evaluation for details):  ADL's;IADL's;Work;Social Participation    Rehab Potential  Good    Current Impairments/barriers affecting progress:  headaches with electronic devices, works full time on the computer with productivity demands, right visual deficits, impaired cognitive processing, problem solving    OT Frequency  2x / week    OT Duration  12 weeks    OT Treatment/Interventions  Self-care/ADL training;Therapeutic exercise;Visual/perceptual remediation/compensation;Moist Heat;Neuromuscular education;Patient/family education;Therapeutic activities;Manual Therapy;Cognitive remediation/compensation;DME and/or AE instruction    Clinical Decision Making  Limited treatment options, no task modification necessary    Consulted and Agree with Plan of Care  Patient       Patient will benefit from skilled therapeutic intervention in order to improve the following deficits and impairments:  Decreased cognition, Impaired vision/preception, Decreased coordination, Decreased activity tolerance, Decreased strength, Impaired UE functional use  Visit Diagnosis: Visual impairment    Problem List Patient Active Problem List   Diagnosis Date Noted  . Diverticulitis of colon 06/21/2018  . Sigmoid diverticulitis   . Coronary artery disease 03/09/2018  . Diabetes ([place]) 03/09/2018  . Anxiety, generalized 10/20/2016  . H/O carotid endarterectomy 10/20/2016  . Hyperlipidemia, mixed 10/20/2016  . Pruritic erythematous rash 10/20/2016  . Dependence on nicotine from other  tobacco product 07/11/2016  . Tobacco abuse 07/11/2016  . Family history of colon cancer 12/07/2015  . Family history of ovarian cancer 12/07/2015  . Prediabetes 12/04/2015  . Hx of endarterectomy 11/02/2015  . Essential hypertension 11/02/2015  . Hypertriglyceridemia 09/03/2015  . Adiposity 09/03/2015  . BMI 29.0-29.9,adult 07/17/2015  . Anxiety 07/17/2015  . GERD (gastroesophageal reflux disease) 07/17/2015  . Hot flashes, menopausal 07/17/2015    Oliver Hum, OTS 09/02/2018, 4:07 PM   This entire session was performed under direct supervision and direction of a licensed therapist/therapist assistant.  I have personally read, edited and approve of the note as written.  Chrys Racer, OTR/L ascom [phone] 09/02/18, 4:26 PM   [place] MAIN [facility] SERVICES [address] [place], [place], [address] Phone: [phone]   Fax:  [phone]  Name: TRYSTAN EADS MRN: [id number] Date of Birth: Feb 08, 1966

## 2018-09-03 ENCOUNTER — Encounter: Payer: Self-pay | Admitting: Speech Pathology

## 2018-09-03 NOTE — Therapy (Signed)
[place] MAIN [facility] SERVICES [address] [place], [place], [address] Phone: [phone]   Fax:  [phone]  Speech Language Pathology Treatment  Patient Details  Name: Courtney King MRN: [id number] Date of Birth: 1966/07/27 Referring Provider (SLP): [place], [place] Ander Purpura    Encounter Date: 09/02/2018  End of Session - 09/03/18 1349    Visit Number  8    Number of Visits  17    Date for SLP Re-Evaluation  09/28/18    SLP Start Time  1600    SLP Stop Time   1645    SLP Time Calculation (min)  45 min    Activity Tolerance  Patient tolerated treatment well;Patient limited by pain       Past Medical History:  Diagnosis Date  . Allergy   . Anxiety   . Diabetes mellitus without complication ([place])   . GERD (gastroesophageal reflux disease)   . H/O blood clots 2014   in abdominal aorta - endarterectomy at [facility]  . Hyperlipidemia   . Hypertension   . Left leg claudication [facility])     Past Surgical History:  Procedure Laterality Date  . ABDOMINAL HYSTERECTOMY  2004  . AORTIC ENDARTERECETOMY  2013  . CHOLECYSTECTOMY  1999  . COLONOSCOPY WITH PROPOFOL N/A 04/03/2017   Procedure: COLONOSCOPY WITH PROPOFOL;  Surgeon: Jonathon Bellows, MD;  Location: [facility] ENDOSCOPY;  Service: Endoscopy;  Laterality: N/A;  . ESOPHAGOGASTRODUODENOSCOPY (EGD) WITH PROPOFOL N/A 04/03/2017   Procedure: ESOPHAGOGASTRODUODENOSCOPY (EGD) WITH PROPOFOL;  Surgeon: Jonathon Bellows, MD;  Location: [facility] ENDOSCOPY;  Service: Endoscopy;  Laterality: N/A;  . EUS N/A 04/30/2017   Procedure: FULL UPPER ENDOSCOPIC ULTRASOUND (EUS) RADIAL;  Surgeon: Jola Schmidt, MD;  Location: [facility] ENDOSCOPY;  Service: Endoscopy;  Laterality: N/A;  . FOOT SURGERY Left    plantar fasciitis  . LAPAROSCOPIC NISSEN FUNDOPLICATION  4967   done in [place] to eliminate acid reflux  . LAPAROSCOPIC SIGMOID COLECTOMY N/A 06/29/2018   Procedure: LAPAROSCOPIC SIGMOID COLECTOMY;  Surgeon: Jules Husbands, MD;   Location: [facility] ORS;  Service: General;  Laterality: N/A;  . OOPHORECTOMY Bilateral 2004    There were no vitals filed for this visit.  Subjective Assessment - 09/03/18 1349    Subjective  The patient reports increased headache after working with OT and concentration            ADULT SLP TREATMENT - 09/03/18 0001      General Information   Behavior/Cognition  Alert;Cooperative;Pleasant mood    HPI  Courtney King is a 52 y.o. female: presents for f/u stroke. 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 Deficits began after surgery on 06/29/2018 but were thought to be 2/2 anesthesia. Pt has followed with neurology. Has appt with ophthamology for visual field deficits this week. Awaiting OT/ Speech eval for aphasia- slow and difficult speech since 8/13.       Treatment Provided   Treatment provided  Cognitive-Linquistic      Pain Assessment   Pain Assessment  0-10    Pain Score  --   headache     Cognitive-Linquistic Treatment   Treatment focused on  Apraxia;Aphasia    Skilled Treatment  The patient completed a variety of moderately complex cognitive-linguistic tasks (name category given 3 members, identify wrong item in list and state why, verbal analogies, word deduction, semantic feature analysis) with overall 80% accuracy and 90% fluency.  Complete complex written direction with mod cues to attend to detail and  recall common knowledge.      Assessment / Recommendations / Plan   Plan  Continue with current plan of care      Progression Toward Goals   Progression toward goals  Progressing toward goals       SLP Education - 09/03/18 1349    Education Details  word finding strategies    Person(s) Educated  Patient    Methods  Explanation    Comprehension  Verbalized understanding         SLP Long Term Goals - 07/30/18 1600      SLP LONG TERM GOAL #1   Title  Patient will generate grammatical, fluent, and cogent sentences to complete abstract/complex  linguistic task with 80% accuracy.    Time  8    Period  Weeks    Status  New    Target Date  09/28/18      SLP LONG TERM GOAL #2   Title  Patient will complete high level word finding tasks with 80% accuracy.    Time  8    Period  Weeks    Status  New    Target Date  09/28/18      SLP LONG TERM GOAL #3   Title  Patient will write grammatical and cogent sentence to complete abstract/complex linguistic task with 80% accuracy.    Time  8    Period  Weeks    Status  New    Target Date  09/28/18      SLP LONG TERM GOAL #4   Title  Patient will demonstrate reading comprehension for paragraphs with 80% accuracy.     Time  8    Period  Weeks    Status  New    Target Date  09/28/18       Plan - 09/03/18 1350    Clinical Impression Statement  The patient remains more fluent and  less bothered by delays in word finding.  Patient reports having a headache after working with OT earlier and any time she has to concentrate.    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 Diverticulitis of colon 06/21/2018  . Sigmoid diverticulitis   . Coronary artery disease 03/09/2018  .  Diabetes ([place]) 03/09/2018  . Anxiety, generalized 10/20/2016  . H/O carotid endarterectomy 10/20/2016  . Hyperlipidemia, mixed 10/20/2016  . Pruritic erythematous rash 10/20/2016  . Dependence on nicotine from other tobacco product 07/11/2016  . Tobacco abuse 07/11/2016  . Family history of colon cancer 12/07/2015  . Family history of ovarian cancer 12/07/2015  . Prediabetes 12/04/2015  . Hx of endarterectomy 11/02/2015  . Essential hypertension 11/02/2015  . Hypertriglyceridemia 09/03/2015  . Adiposity 09/03/2015  . BMI 29.0-29.9,adult 07/17/2015  . Anxiety 07/17/2015  . GERD (gastroesophageal reflux disease) 07/17/2015  .  Hot flashes, menopausal 07/17/2015   Leroy Sea, MS/CCC- SLP  Lou Miner 09/03/2018, 1:50 PM  [place] MAIN [facility] SERVICES [address] [place], [place], [address] Phone: [phone]   Fax:  [phone]   Name: Courtney King MRN: [id number] Date of Birth: 03/25/1966

## 2018-09-06 ENCOUNTER — Ambulatory Visit: Payer: 59

## 2018-09-06 ENCOUNTER — Encounter: Payer: 59 | Admitting: Speech Pathology

## 2018-09-06 ENCOUNTER — Encounter: Payer: 59 | Admitting: Occupational Therapy

## 2018-09-08 ENCOUNTER — Encounter: Payer: Self-pay | Admitting: Occupational Therapy

## 2018-09-08 ENCOUNTER — Ambulatory Visit: Payer: 59

## 2018-09-08 ENCOUNTER — Ambulatory Visit: Payer: Managed Care, Other (non HMO) | Admitting: Speech Pathology

## 2018-09-08 ENCOUNTER — Ambulatory Visit: Payer: Managed Care, Other (non HMO) | Admitting: Occupational Therapy

## 2018-09-08 DIAGNOSIS — R41841 Cognitive communication deficit: Secondary | ICD-10-CM

## 2018-09-08 DIAGNOSIS — I69190 Apraxia following nontraumatic intracerebral hemorrhage: Secondary | ICD-10-CM

## 2018-09-08 DIAGNOSIS — H547 Unspecified visual loss: Secondary | ICD-10-CM | POA: Diagnosis not present

## 2018-09-08 DIAGNOSIS — R278 Other lack of coordination: Secondary | ICD-10-CM

## 2018-09-08 NOTE — Therapy (Addendum)
[place] MAIN [facility] SERVICES [address] [place], [place], [address] Phone: [phone]   Fax:  [phone]  Occupational Therapy Treatment  Patient Details  Name: Courtney King MRN: [id number] Date of Birth: Aug 20, 1966 No data recorded  Encounter Date: 09/08/2018  OT End of Session - 09/08/18 1107    Visit Number  8    Number of Visits  24    Date for OT Re-Evaluation  11/04/18    Authorization Type  [employer]    OT Start Time  1000    OT Stop Time  1045    OT Time Calculation (min)  45 min    Equipment Utilized During Treatment  Computer screen overlay    Activity Tolerance  Patient tolerated treatment well    Behavior During Therapy  [employer] for tasks assessed/performed       Past Medical History:  Diagnosis Date  . Allergy   . Anxiety   . Diabetes mellitus without complication ([place])   . GERD (gastroesophageal reflux disease)   . H/O blood clots 2014   in abdominal aorta - endarterectomy at [facility]  . Hyperlipidemia   . Hypertension   . Left leg claudication [facility])     Past Surgical History:  Procedure Laterality Date  . ABDOMINAL HYSTERECTOMY  2004  . AORTIC ENDARTERECETOMY  2013  . CHOLECYSTECTOMY  1999  . COLONOSCOPY WITH PROPOFOL N/A 04/03/2017   Procedure: COLONOSCOPY WITH PROPOFOL;  Surgeon: Jonathon Bellows, MD;  Location: [facility] ENDOSCOPY;  Service: Endoscopy;  Laterality: N/A;  . ESOPHAGOGASTRODUODENOSCOPY (EGD) WITH PROPOFOL N/A 04/03/2017   Procedure: ESOPHAGOGASTRODUODENOSCOPY (EGD) WITH PROPOFOL;  Surgeon: Jonathon Bellows, MD;  Location: [facility] ENDOSCOPY;  Service: Endoscopy;  Laterality: N/A;  . EUS N/A 04/30/2017   Procedure: FULL UPPER ENDOSCOPIC ULTRASOUND (EUS) RADIAL;  Surgeon: Jola Schmidt, MD;  Location: [facility] ENDOSCOPY;  Service: Endoscopy;  Laterality: N/A;  . FOOT SURGERY Left    plantar fasciitis  . LAPAROSCOPIC NISSEN FUNDOPLICATION  4097   done in [place] to eliminate acid reflux  . LAPAROSCOPIC SIGMOID COLECTOMY  N/A 06/29/2018   Procedure: LAPAROSCOPIC SIGMOID COLECTOMY;  Surgeon: Jules Husbands, MD;  Location: [facility] ORS;  Service: General;  Laterality: N/A;  . OOPHORECTOMY Bilateral 2004    There were no vitals filed for this visit.  Subjective Assessment - 09/08/18 1104    Subjective   Pt reports that she is having a good day and is looking forward to spending the afternoon with her grandson for his birthday.    Pertinent History  Patient reports she went in for surgery on August 13th to have part of her colon removed which was infected.  when she woke up, she realized something was wrong, at first they thought it was the anesthesia.  She was having trouble with her right hand, missing her mouth when trying to take meds.  When she returned to have her staples out on august 27th she had a brain scan to confirm she had a stroke.    Patient Stated Goals  "I want to be able to go back to work" , be independent     Currently in Pain?  Yes    Pain Score  7     Pain Location  Head    Pain Orientation  Left    Pain Descriptors / Indicators  Aching    Pain Type  Neuropathic pain    Pain Onset  More than a month ago    Pain Frequency  Intermittent    Aggravating Factors   Concentrating on a task, especially on a computer screen.    Pain Relieving Factors  Rest    Multiple Pain Sites  No      OT TREATMENT  Self-care/IADLs:   Pt completed money management tasks that required her to analyze a predetermined budget. Pt reported no pain at the start of treatment however, quickly experienced pain when focusing at the start of the task. Pt required increased time to analyze budget and was able to formulate sound reasoning. Pt then developed her own budget. Pt required increased time to formulate responses. Pt reported experiencing a headache as the task continued (7/10).Pt completed 10 key keyboarding while using dimmed screen display settings. Pt completed x3 trials of keyboarding and demonstrated increased  typing speed with decreased accuracy with each trial (17wpm with 100%, 18wpm with 98%, 19wpm with 91%).                  OT Education - 09/08/18 1106    Education Details  Visual compensatory strategies while using computer, money management skills    Person(s) Educated  Patient    Methods  Explanation;Demonstration    Comprehension  Verbalized understanding;Returned demonstration;Need further instruction          OT Long Term Goals - 08/16/18 1054      OT LONG TERM GOAL #1   Title  Patient will complete hair care with modified independence.     Baseline  difficulty with reaching and completing at eval.     Time  8    Period  Weeks    Status  New    Target Date  10/05/18      OT LONG TERM GOAL #2   Title  Patient will complete shower transfers with modified independence     Baseline  supervision for safety at eval     Time  8    Period  Weeks    Status  New    Target Date  10/05/18      OT LONG TERM GOAL #3   Title  Patient will complete light meal preparation with modified independence     Baseline  unable at eval     Time  12    Period  Weeks    Status  New    Target Date  11/04/18      OT LONG TERM GOAL #4   Title  Patient will complete light housekeeping tasks with modified independence     Baseline  unable at eval     Time  12    Period  Weeks    Status  New    Target Date  11/04/18      OT LONG TERM GOAL #5   Title  Patient will complete money management with bill paying and balancing accounts with 100% accuracy     Baseline  requires min assist at eval     Time  12    Period  Weeks    Status  New    Target Date  11/04/18      Long Term Additional Goals   Additional Long Term Goals  Yes      OT LONG TERM GOAL #6   Title  Patient will tolerate 30 mins of tasks on the computer working towards work tasks without complaints of pain (headache)    Baseline  unable at eval    Time  12    Period  Weeks  Status  New    Target Date   11/04/18            Plan - 09/08/18 1108    Clinical Impression Statement  Pt continues to develop headaches when using the computer and when concentrating on a task.  Pt continues to improve money management skills, focusing on budgeting and saving for applicable life scenarios. Pt did not have a headache at the start of treatment today, however pt's headache quickly increased to a 7/10 upon starting treatment. Visual compensatory strategies while using the computer will maximize pt's performance in order to ultimately return to work.    Occupational Profile and client history currently impacting functional performance  current smoker    Occupational performance deficits (Please refer to evaluation for details):  ADL's;IADL's;Work;Social Participation    Rehab Potential  Good    Current Impairments/barriers affecting progress:  headaches with electronic devices, works full time on the computer with productivity demands, right visual deficits, impaired cognitive processing, problem solving    OT Frequency  2x / week    OT Duration  12 weeks    OT Treatment/Interventions  Self-care/ADL training;Therapeutic exercise;Visual/perceptual remediation/compensation;Moist Heat;Neuromuscular education;Patient/family education;Therapeutic activities;Manual Therapy;Cognitive remediation/compensation;DME and/or AE instruction    Clinical Decision Making  Limited treatment options, no task modification necessary    Consulted and Agree with Plan of Care  Patient       Patient will benefit from skilled therapeutic intervention in order to improve the following deficits and impairments:  Decreased cognition, Impaired vision/preception, Decreased coordination, Decreased activity tolerance, Decreased strength, Impaired UE functional use  Visit Diagnosis: Other lack of coordination  Cognitive communication deficit    Problem List Patient Active Problem List   Diagnosis Date Noted  . Diverticulitis of  colon 06/21/2018  . Sigmoid diverticulitis   . Coronary artery disease 03/09/2018  . Diabetes ([place]) 03/09/2018  . Anxiety, generalized 10/20/2016  . H/O carotid endarterectomy 10/20/2016  . Hyperlipidemia, mixed 10/20/2016  . Pruritic erythematous rash 10/20/2016  . Dependence on nicotine from other tobacco product 07/11/2016  . Tobacco abuse 07/11/2016  . Family history of colon cancer 12/07/2015  . Family history of ovarian cancer 12/07/2015  . Prediabetes 12/04/2015  . Hx of endarterectomy 11/02/2015  . Essential hypertension 11/02/2015  . Hypertriglyceridemia 09/03/2015  . Adiposity 09/03/2015  . BMI 29.0-29.9,adult 07/17/2015  . Anxiety 07/17/2015  . GERD (gastroesophageal reflux disease) 07/17/2015  . Hot flashes, menopausal 07/17/2015    Oliver Hum, OTS 09/08/2018, 11:13 AM   This entire session was performed under direct supervision and direction of a licensed therapist/therapist assistant . I have personally read, edited and approve of the note as written.  Harrel Carina, MS, OTR/L   [place] MAIN [facility] SERVICES [address] [place], [place], [address] Phone: [phone]   Fax:  [phone]  Name: Courtney King MRN: [id number] Date of Birth: 1966-08-02

## 2018-09-09 ENCOUNTER — Encounter: Payer: Self-pay | Admitting: Speech Pathology

## 2018-09-09 NOTE — Therapy (Signed)
[place] MAIN [facility] SERVICES [address] [place], [place], [address] Phone: [phone]   Fax:  [phone]  Speech Language Pathology Treatment  Patient Details  Name: Courtney King MRN: [id number] Date of Birth: 18-Jul-1966 Referring Provider (SLP): [place], [place] Ander Purpura    Encounter Date: 09/08/2018  End of Session - 09/09/18 1232    Visit Number  9    Number of Visits  17    Date for SLP Re-Evaluation  09/28/18    SLP Start Time  0900    SLP Stop Time   0952    SLP Time Calculation (min)  52 min    Activity Tolerance  Patient tolerated treatment well       Past Medical History:  Diagnosis Date  . Allergy   . Anxiety   . Diabetes mellitus without complication ([place])   . GERD (gastroesophageal reflux disease)   . H/O blood clots 2014   in abdominal aorta - endarterectomy at [facility]  . Hyperlipidemia   . Hypertension   . Left leg claudication [facility])     Past Surgical History:  Procedure Laterality Date  . ABDOMINAL HYSTERECTOMY  2004  . AORTIC ENDARTERECETOMY  2013  . CHOLECYSTECTOMY  1999  . COLONOSCOPY WITH PROPOFOL N/A 04/03/2017   Procedure: COLONOSCOPY WITH PROPOFOL;  Surgeon: Jonathon Bellows, MD;  Location: [facility] ENDOSCOPY;  Service: Endoscopy;  Laterality: N/A;  . ESOPHAGOGASTRODUODENOSCOPY (EGD) WITH PROPOFOL N/A 04/03/2017   Procedure: ESOPHAGOGASTRODUODENOSCOPY (EGD) WITH PROPOFOL;  Surgeon: Jonathon Bellows, MD;  Location: [facility] ENDOSCOPY;  Service: Endoscopy;  Laterality: N/A;  . EUS N/A 04/30/2017   Procedure: FULL UPPER ENDOSCOPIC ULTRASOUND (EUS) RADIAL;  Surgeon: Jola Schmidt, MD;  Location: [facility] ENDOSCOPY;  Service: Endoscopy;  Laterality: N/A;  . FOOT SURGERY Left    plantar fasciitis  . LAPAROSCOPIC NISSEN FUNDOPLICATION  6433   done in [place] to eliminate acid reflux  . LAPAROSCOPIC SIGMOID COLECTOMY N/A 06/29/2018   Procedure: LAPAROSCOPIC SIGMOID COLECTOMY;  Surgeon: Jules Husbands, MD;  Location: [facility] ORS;  Service:  General;  Laterality: N/A;  . OOPHORECTOMY Bilateral 2004    There were no vitals filed for this visit.  Subjective Assessment - 09/09/18 1231    Subjective  The patient reports onset of slight headache after maintaining conversation, answering questions, remembering past/current events            ADULT SLP TREATMENT - 09/09/18 0001      General Information   Behavior/Cognition  Alert;Cooperative;Pleasant mood    HPI  Courtney King is a 52 y.o. female: presents for f/u stroke. 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 Deficits began after surgery on 06/29/2018 but were thought to be 2/2 anesthesia. Pt has followed with neurology. Has appt with ophthamology for visual field deficits this week. Awaiting OT/ Speech eval for aphasia- slow and difficult speech since 8/13.       Treatment Provided   Treatment provided  Cognitive-Linquistic      Pain Assessment   Pain Assessment  No/denies pain      Cognitive-Linquistic Treatment   Treatment focused on  Apraxia;Aphasia    Skilled Treatment  VERBAL EXPRESSION: Maintain conversation with 90% fluency and minimal word finding errors.  WORD FINDING: complete verbal analogies with 80% accuracy. READING: Complete complex written direction with min cues given extra processing time.      Assessment / Recommendations / Plan   Plan  Continue with current plan of care  Progression Toward Goals   Progression toward goals  Progressing toward goals       SLP Education - 09/09/18 1232    Education Details  Allow for extra processing time    Person(s) Educated  Patient    Methods  Explanation    Comprehension  Verbalized understanding         SLP Long Term Goals - 07/30/18 1600      SLP LONG TERM GOAL #1   Title  Patient will generate grammatical, fluent, and cogent sentences to complete abstract/complex linguistic task with 80% accuracy.    Time  8    Period  Weeks    Status  New    Target Date  09/28/18       SLP LONG TERM GOAL #2   Title  Patient will complete high level word finding tasks with 80% accuracy.    Time  8    Period  Weeks    Status  New    Target Date  09/28/18      SLP LONG TERM GOAL #3   Title  Patient will write grammatical and cogent sentence to complete abstract/complex linguistic task with 80% accuracy.    Time  8    Period  Weeks    Status  New    Target Date  09/28/18      SLP LONG TERM GOAL #4   Title  Patient will demonstrate reading comprehension for paragraphs with 80% accuracy.     Time  8    Period  Weeks    Status  New    Target Date  09/28/18       Plan - 09/09/18 1233    Clinical Impression Statement  The patient remains more fluent and  less bothered by delays in word finding.  Patient reports having a headache after working with OT earlier and any time she has to concentrate.  She is avoiding activities, such as watching TV and reading, due to headaches.     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 Diverticulitis of colon 06/21/2018  . Sigmoid diverticulitis   . Coronary artery disease 03/09/2018  . Diabetes ([place]) 03/09/2018  .  Anxiety, generalized 10/20/2016  . H/O carotid endarterectomy 10/20/2016  . Hyperlipidemia, mixed 10/20/2016  . Pruritic erythematous rash 10/20/2016  . Dependence on nicotine from other tobacco product 07/11/2016  . Tobacco abuse 07/11/2016  . Family history of colon cancer 12/07/2015  . Family history of ovarian cancer 12/07/2015  . Prediabetes 12/04/2015  . Hx of endarterectomy 11/02/2015  . Essential hypertension 11/02/2015  . Hypertriglyceridemia 09/03/2015  . Adiposity 09/03/2015  . BMI 29.0-29.9,adult 07/17/2015  . Anxiety 07/17/2015  . GERD (gastroesophageal reflux disease) 07/17/2015  . Hot flashes, menopausal 07/17/2015   Leroy Sea, MS/CCC- SLP  Lou Miner 09/09/2018, 12:34 PM  [place] MAIN [facility] SERVICES [address] [place], [place], [address] Phone: [phone]   Fax:  [phone]   Name: MIAH BOYE MRN: [id number] Date of Birth: 03-23-1966

## 2018-09-13 ENCOUNTER — Ambulatory Visit: Payer: Managed Care, Other (non HMO) | Admitting: Occupational Therapy

## 2018-09-13 ENCOUNTER — Ambulatory Visit: Payer: Managed Care, Other (non HMO) | Admitting: Speech Pathology

## 2018-09-13 ENCOUNTER — Ambulatory Visit: Payer: 59

## 2018-09-13 ENCOUNTER — Encounter: Payer: 59 | Admitting: Occupational Therapy

## 2018-09-15 ENCOUNTER — Ambulatory Visit: Payer: Managed Care, Other (non HMO) | Admitting: Occupational Therapy

## 2018-09-15 ENCOUNTER — Ambulatory Visit: Payer: 59

## 2018-09-15 ENCOUNTER — Ambulatory Visit: Payer: Managed Care, Other (non HMO) | Admitting: Speech Pathology

## 2018-09-21 ENCOUNTER — Ambulatory Visit: Payer: Managed Care, Other (non HMO) | Admitting: Occupational Therapy

## 2018-09-23 ENCOUNTER — Encounter: Payer: Self-pay | Admitting: Speech Pathology

## 2018-09-23 ENCOUNTER — Ambulatory Visit: Payer: Managed Care, Other (non HMO) | Attending: Family Medicine | Admitting: Speech Pathology

## 2018-09-23 DIAGNOSIS — I69190 Apraxia following nontraumatic intracerebral hemorrhage: Secondary | ICD-10-CM | POA: Insufficient documentation

## 2018-09-23 DIAGNOSIS — R278 Other lack of coordination: Secondary | ICD-10-CM | POA: Insufficient documentation

## 2018-09-23 DIAGNOSIS — R41841 Cognitive communication deficit: Secondary | ICD-10-CM | POA: Diagnosis not present

## 2018-09-23 NOTE — Therapy (Signed)
[place] MAIN [facility] SERVICES [address] [place], [place], [address] Phone: [phone]   Fax:  [phone]  Speech Language Pathology Treatment  Patient Details  Name: Courtney King MRN: [id number] Date of Birth: [birth year] Referring Provider (SLP): Courtney King, [place] Ander Purpura    Encounter Date: 09/23/2018  End of Session - 09/23/18 1517    Visit Number  10    Number of Visits  17    Date for SLP Re-Evaluation  09/28/18    SLP Start Time  1300    SLP Stop Time   1400    SLP Time Calculation (min)  60 min    Activity Tolerance  Patient tolerated treatment well       Past Medical History:  Diagnosis Date  . Allergy   . Anxiety   . Diabetes mellitus without complication ([place])   . GERD (gastroesophageal reflux disease)   . H/O blood clots 2014   in abdominal aorta - endarterectomy at [facility]  . Hyperlipidemia   . Hypertension   . Left leg claudication [facility])     Past Surgical History:  Procedure Laterality Date  . ABDOMINAL HYSTERECTOMY  2004  . AORTIC ENDARTERECETOMY  2013  . CHOLECYSTECTOMY  1999  . COLONOSCOPY WITH PROPOFOL N/A 04/03/2017   Procedure: COLONOSCOPY WITH PROPOFOL;  Surgeon: Jonathon Bellows, MD;  Location: [facility] ENDOSCOPY;  Service: Endoscopy;  Laterality: N/A;  . ESOPHAGOGASTRODUODENOSCOPY (EGD) WITH PROPOFOL N/A 04/03/2017   Procedure: ESOPHAGOGASTRODUODENOSCOPY (EGD) WITH PROPOFOL;  Surgeon: Jonathon Bellows, MD;  Location: [facility] ENDOSCOPY;  Service: Endoscopy;  Laterality: N/A;  . EUS N/A 04/30/2017   Procedure: FULL UPPER ENDOSCOPIC ULTRASOUND (EUS) RADIAL;  Surgeon: Jola Schmidt, MD;  Location: [facility] ENDOSCOPY;  Service: Endoscopy;  Laterality: N/A;  . FOOT SURGERY Left    plantar fasciitis  . LAPAROSCOPIC NISSEN FUNDOPLICATION  6629   done in [place] to eliminate acid reflux  . LAPAROSCOPIC SIGMOID COLECTOMY N/A 06/29/2018   Procedure: LAPAROSCOPIC SIGMOID COLECTOMY;  Surgeon: Jules Husbands, MD;  Location: [facility] ORS;  Service:  General;  Laterality: N/A;  . OOPHORECTOMY Bilateral 2004    There were no vitals filed for this visit.  Subjective Assessment - 09/23/18 1516    Subjective  The patient reports headache secondary family stresses            ADULT SLP TREATMENT - 09/23/18 0001      General Information   Behavior/Cognition  Alert;Cooperative;Pleasant mood    HPI  Courtney King is a 52 y.o. female: presents for f/u stroke. 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 Deficits began after surgery on 06/29/2018 but were thought to be 2/2 anesthesia. Pt has followed with neurology. Has appt with ophthamology for visual field deficits this week. Awaiting OT/ Speech eval for aphasia- slow and difficult speech since 8/13.       Treatment Provided   Treatment provided  Cognitive-Linquistic      Pain Assessment   Pain Assessment  0-10    Pain Score  --   Headache     Cognitive-Linquistic Treatment   Treatment focused on  Apraxia;Aphasia    Skilled Treatment  VERBAL EXPRESSION: Maintain conversation with 90% fluency and minimal word finding errors.  WORD FINDING: complete verbal analogies with 80% accuracy. READING: Complete written direction with min cues given extra processing time.  The patient reports significant difficulty with functional reading at home- cannot concentrate sufficiently to comprehend medical bills, etc.      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    Time  8    Period  Weeks    Status  New    Target Date  09/28/18      SLP LONG TERM GOAL #2   Title  Patient will complete high level word finding tasks with 80% accuracy.    Time  8    Period  Weeks    Status  New    Target Date  09/28/18      SLP LONG TERM GOAL #3   Title  Patient will write grammatical and cogent sentence to complete abstract/complex linguistic task with 80% accuracy.    Time  8    Period  Weeks    Status  New    Target Date  09/28/18      SLP LONG TERM GOAL #4   Title  Patient will demonstrate reading comprehension for paragraphs with 80% accuracy.     Time  8    Period  Weeks    Status  New    Target Date  09/28/18       Plan - 09/23/18 1518    Clinical Impression Statement  The patient continues to have headache when concentrating and when under stress.  Her fluency and word finding are much improved since beginning speech therapy but her ability to concentrate, work on the computer, and comprehend written information remain impaired.      Speech Therapy Frequency  2x / week    Duration  Other (comment)    Treatment/Interventions  Language facilitation;SLP instruction and feedback;Compensatory techniques;Cognitive reorganization;Patient/family education    Potential to Achieve Goals  Good    Potential Considerations  Ability to learn/carryover information;Pain level;Family/community support;Co-morbidities;Previous level of function;Cooperation/participation level;Severity of impairments;Medical prognosis    Consulted and Agree with Plan of Care  Patient       Patient will benefit from skilled therapeutic intervention in order to improve the following deficits and impairments:   Cognitive communication deficit  Apraxia following nontraumatic intracerebral hemorrhage    Problem List Patient Active Problem List   Diagnosis Date Noted  . Diverticulitis of colon 06/21/2018   . Sigmoid diverticulitis   . Coronary artery disease 03/09/2018  . Diabetes ([place]) 03/09/2018  . Anxiety, generalized 10/20/2016  . H/O carotid endarterectomy 10/20/2016  . Hyperlipidemia, mixed 10/20/2016  . Pruritic erythematous rash 10/20/2016  . Dependence on nicotine from other tobacco product 07/11/2016  . Tobacco abuse 07/11/2016  . Family history of colon cancer 12/07/2015  . Family history of ovarian cancer 12/07/2015  . Prediabetes 12/04/2015  . Hx of endarterectomy 11/02/2015  . Essential hypertension 11/02/2015  . Hypertriglyceridemia 09/03/2015  . Adiposity 09/03/2015  . BMI 29.0-29.9,adult 07/17/2015  . Anxiety 07/17/2015  . GERD (gastroesophageal reflux disease) 07/17/2015  . Hot  flashes, menopausal 07/17/2015   Leroy Sea, MS/CCC- SLP  Lou Miner 09/23/2018, 3:19 PM  [place] MAIN [facility] SERVICES [address] [place], [place], [address] Phone: [phone]   Fax:  [phone]   Name: Courtney King MRN: [id number] Date of Birth: 1965/11/28

## 2018-09-27 ENCOUNTER — Ambulatory Visit: Payer: Managed Care, Other (non HMO) | Admitting: Speech Pathology

## 2018-09-27 DIAGNOSIS — R41841 Cognitive communication deficit: Secondary | ICD-10-CM | POA: Diagnosis not present

## 2018-09-27 DIAGNOSIS — I69190 Apraxia following nontraumatic intracerebral hemorrhage: Secondary | ICD-10-CM

## 2018-09-28 ENCOUNTER — Encounter: Payer: Self-pay | Admitting: Speech Pathology

## 2018-09-28 NOTE — Therapy (Signed)
[place] MAIN [facility] SERVICES [address] [place], [place], [address] Phone: [phone]   Fax:  [phone]  Speech Language Pathology Treatment  Patient Details  Name: Courtney King MRN: [id number] Date of Birth: 1965/12/12 Referring Provider (SLP): [place], [place] Ander Purpura    Encounter Date: 09/27/2018  End of Session - 09/28/18 1326    Visit Number  11    Number of Visits  17    Date for SLP Re-Evaluation  09/28/18    SLP Start Time  1600    SLP Stop Time   1650    SLP Time Calculation (min)  50 min    Activity Tolerance  Patient tolerated treatment well       Past Medical History:  Diagnosis Date  . Allergy   . Anxiety   . Diabetes mellitus without complication ([place])   . GERD (gastroesophageal reflux disease)   . H/O blood clots 2014   in abdominal aorta - endarterectomy at [facility]  . Hyperlipidemia   . Hypertension   . Left leg claudication [facility])     Past Surgical History:  Procedure Laterality Date  . ABDOMINAL HYSTERECTOMY  2004  . AORTIC ENDARTERECETOMY  2013  . CHOLECYSTECTOMY  1999  . COLONOSCOPY WITH PROPOFOL N/A 04/03/2017   Procedure: COLONOSCOPY WITH PROPOFOL;  Surgeon: Jonathon Bellows, MD;  Location: [facility] ENDOSCOPY;  Service: Endoscopy;  Laterality: N/A;  . ESOPHAGOGASTRODUODENOSCOPY (EGD) WITH PROPOFOL N/A 04/03/2017   Procedure: ESOPHAGOGASTRODUODENOSCOPY (EGD) WITH PROPOFOL;  Surgeon: Jonathon Bellows, MD;  Location: [facility] ENDOSCOPY;  Service: Endoscopy;  Laterality: N/A;  . EUS N/A 04/30/2017   Procedure: FULL UPPER ENDOSCOPIC ULTRASOUND (EUS) RADIAL;  Surgeon: Jola Schmidt, MD;  Location: [facility] ENDOSCOPY;  Service: Endoscopy;  Laterality: N/A;  . FOOT SURGERY Left    plantar fasciitis  . LAPAROSCOPIC NISSEN FUNDOPLICATION  7371   done in [place] to eliminate acid reflux  . LAPAROSCOPIC SIGMOID COLECTOMY N/A 06/29/2018   Procedure: LAPAROSCOPIC SIGMOID COLECTOMY;  Surgeon: Jules Husbands, MD;  Location: [facility] ORS;  Service:  General;  Laterality: N/A;  . OOPHORECTOMY Bilateral 2004    There were no vitals filed for this visit.  Subjective Assessment - 09/28/18 1322    Subjective  The patient reports headaches prevent her from pursuing common activities            ADULT SLP TREATMENT - 09/28/18 0001      General Information   Behavior/Cognition  Alert;Cooperative;Pleasant mood    HPI  Courtney King is a 52 y.o. female: presents for f/u stroke. 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 Deficits began after surgery on 06/29/2018 but were thought to be 2/2 anesthesia. Pt has followed with neurology. Has appt with ophthamology for visual field deficits this week. Awaiting OT/ Speech eval for aphasia- slow and difficult speech since 8/13.       Treatment Provided   Treatment provided  Dysphagia      Pain Assessment   Pain Assessment  0-10    Pain Score  5     Pain Location  Head      Cognitive-Linquistic Treatment   Treatment focused on  Apraxia;Aphasia    Skilled Treatment  HEAD ACHE:  The patient states that she gets a head ache when she reads or concentrates.  Patient stated head ache pain was a 0 before beginning a reading task.  The patient completed 3 items before reporting onset of head ache.  At the completion of  the task, the patient stated the pain was at 5/10.  The patient has been given several simple paper and pencil activities to try at home and rate tendency to lead to a head ache.  The patient completed several new tasks (coloring, word search) with no increase in pain.  The patient was given a maze activity, that was a very bold/sharp contrast of white and black, which increased pain immediately.  She was then not able to read to complete a simple task (complained of double vision).       Assessment / Recommendations / Plan   Plan  Continue with current plan of care      Progression Toward Goals   Progression toward goals  Progressing toward goals       SLP Education  - 09/28/18 1323    Education Details  try simple visuospatial tasks to improve tolerance of concentrating on visual stimuli    Person(s) Educated  Patient    Methods  Explanation    Comprehension  Verbalized understanding         SLP Long Term Goals - 07/30/18 1600      SLP LONG TERM GOAL #1   Title  Patient will generate grammatical, fluent, and cogent sentences to complete abstract/complex linguistic task with 80% accuracy.    Time  8    Period  Weeks    Status  New    Target Date  09/28/18      SLP LONG TERM GOAL #2   Title  Patient will complete high level word finding tasks with 80% accuracy.    Time  8    Period  Weeks    Status  New    Target Date  09/28/18      SLP LONG TERM GOAL #3   Title  Patient will write grammatical and cogent sentence to complete abstract/complex linguistic task with 80% accuracy.    Time  8    Period  Weeks    Status  New    Target Date  09/28/18      SLP LONG TERM GOAL #4   Title  Patient will demonstrate reading comprehension for paragraphs with 80% accuracy.     Time  8    Period  Weeks    Status  New    Target Date  09/28/18       Plan - 09/28/18 1327    Clinical Impression Statement  The patient continues to have headache when concentrating and when under stress.  Her fluency and word finding are much improved since beginning speech therapy but her ability to concentrate, work on the computer, and comprehend written information remain impaired.      Speech Therapy Frequency  2x / week    Duration  Other (comment)    Treatment/Interventions  Language facilitation;SLP instruction and feedback;Compensatory techniques;Cognitive reorganization;Patient/family education    Potential to Achieve Goals  Good    Potential Considerations  Ability to learn/carryover information;Pain level;Family/community support;Co-morbidities;Previous level of function;Cooperation/participation level;Severity of impairments;Medical prognosis    SLP Home  Exercise Plan  visuospatial activities    Consulted and Agree with Plan of Care  Patient       Patient will benefit from skilled therapeutic intervention in order to improve the following deficits and impairments:   Cognitive communication deficit  Apraxia following nontraumatic intracerebral hemorrhage    Problem List Patient Active Problem List   Diagnosis Date Noted  . Diverticulitis of colon 06/21/2018  . Sigmoid diverticulitis   .  Coronary artery disease 03/09/2018  . Diabetes ([place]) 03/09/2018  . Anxiety, generalized 10/20/2016  . H/O carotid endarterectomy 10/20/2016  . Hyperlipidemia, mixed 10/20/2016  . Pruritic erythematous rash 10/20/2016  . Dependence on nicotine from other tobacco product 07/11/2016  . Tobacco abuse 07/11/2016  . Family history of colon cancer 12/07/2015  . Family history of ovarian cancer 12/07/2015  . Prediabetes 12/04/2015  . Hx of endarterectomy 11/02/2015  . Essential hypertension 11/02/2015  . Hypertriglyceridemia 09/03/2015  . Adiposity 09/03/2015  . BMI 29.0-29.9,adult 07/17/2015  . Anxiety 07/17/2015  . GERD (gastroesophageal reflux disease) 07/17/2015  . Hot flashes, menopausal 07/17/2015   Leroy Sea, MS/CCC- SLP  Lou Miner 09/28/2018, 1:28 PM  [place] MAIN [facility] SERVICES [address] [place], [place], [address] Phone: [phone]   Fax:  [phone]   Name: Courtney King MRN: [id number] Date of Birth: 27-Aug-1966

## 2018-09-29 ENCOUNTER — Ambulatory Visit: Payer: Managed Care, Other (non HMO) | Admitting: Speech Pathology

## 2018-09-29 ENCOUNTER — Encounter: Payer: Self-pay | Admitting: Occupational Therapy

## 2018-09-29 ENCOUNTER — Ambulatory Visit: Payer: Managed Care, Other (non HMO) | Admitting: Occupational Therapy

## 2018-09-29 DIAGNOSIS — R41841 Cognitive communication deficit: Secondary | ICD-10-CM

## 2018-09-29 DIAGNOSIS — I69190 Apraxia following nontraumatic intracerebral hemorrhage: Secondary | ICD-10-CM

## 2018-09-29 NOTE — Therapy (Addendum)
[place] MAIN [facility] SERVICES [address] [place], [place], [address] Phone: [phone]   Fax:  [phone]  Occupational Therapy Treatment  Patient Details  Name: Courtney King MRN: [id number] Date of Birth: 1966/06/17 No data recorded  Encounter Date: 09/29/2018  OT End of Session - 09/29/18 1735    Visit Number  9    Number of Visits  24    Date for OT Re-Evaluation  11/04/18    Authorization Type  [employer]    OT Start Time  1645    OT Stop Time  1730    OT Time Calculation (min)  45 min    Equipment Utilized During Treatment  Money manipulatives    Activity Tolerance  Patient tolerated treatment well    Behavior During Therapy  [facility] for tasks assessed/performed       Past Medical History:  Diagnosis Date  . Allergy   . Anxiety   . Diabetes mellitus without complication ([place])   . GERD (gastroesophageal reflux disease)   . H/O blood clots 2014   in abdominal aorta - endarterectomy at [facility]  . Hyperlipidemia   . Hypertension   . Left leg claudication [facility])     Past Surgical History:  Procedure Laterality Date  . ABDOMINAL HYSTERECTOMY  2004  . AORTIC ENDARTERECETOMY  2013  . CHOLECYSTECTOMY  1999  . COLONOSCOPY WITH PROPOFOL N/A 04/03/2017   Procedure: COLONOSCOPY WITH PROPOFOL;  Surgeon: Jonathon Bellows, MD;  Location: [facility] ENDOSCOPY;  Service: Endoscopy;  Laterality: N/A;  . ESOPHAGOGASTRODUODENOSCOPY (EGD) WITH PROPOFOL N/A 04/03/2017   Procedure: ESOPHAGOGASTRODUODENOSCOPY (EGD) WITH PROPOFOL;  Surgeon: Jonathon Bellows, MD;  Location: [facility] ENDOSCOPY;  Service: Endoscopy;  Laterality: N/A;  . EUS N/A 04/30/2017   Procedure: FULL UPPER ENDOSCOPIC ULTRASOUND (EUS) RADIAL;  Surgeon: Jola Schmidt, MD;  Location: [facility] ENDOSCOPY;  Service: Endoscopy;  Laterality: N/A;  . FOOT SURGERY Left    plantar fasciitis  . LAPAROSCOPIC NISSEN FUNDOPLICATION  7209   done in [place] to eliminate acid reflux  . LAPAROSCOPIC SIGMOID COLECTOMY N/A  06/29/2018   Procedure: LAPAROSCOPIC SIGMOID COLECTOMY;  Surgeon: Jules Husbands, MD;  Location: [facility] ORS;  Service: General;  Laterality: N/A;  . OOPHORECTOMY Bilateral 2004    There were no vitals filed for this visit.  Subjective Assessment - 09/29/18 1653    Subjective   Pt reports that she has been having difficulties finding consistent transportation to get to therapy appointments.    Pertinent History  Patient reports she went in for surgery on August 13th to have part of her colon removed which was infected.  when she woke up, she realized something was wrong, at first they thought it was the anesthesia.  She was having trouble with her right hand, missing her mouth when trying to take meds.  When she returned to have her staples out on august 27th she had a brain scan to confirm she had a stroke.    Patient Stated Goals  "I want to be able to go back to work" , be independent     Currently in Pain?  Yes    Pain Score  6     Pain Location  Head    Pain Descriptors / Indicators  Aching    Pain Type  Neuropathic pain    Pain Onset  More than a month ago    Pain Frequency  Intermittent    Aggravating Factors   Focusing on a task, especially on  a computer screen.    Pain Relieving Factors  Rest    Multiple Pain Sites  No      OT TREATMENT  Self-care/IADLs:  Pt. worked on Doctor, hospital task that required her to compute the amount of [employer] with taxes and coupons. Pt required increased time to compute amounts however figured totals with 100% accuracy today. Pt was educated on ways that she could apply this task to her daily routine including calculating her restaurant tip amount or her total while in the grocery store line.                    OT Education - 09/29/18 1657    Education Details  Financial management     Person(s) Educated  Patient    Methods  Explanation;Demonstration    Comprehension  Verbalized understanding;Returned  demonstration;Need further instruction          OT Long Term Goals - 08/16/18 1054      OT LONG TERM GOAL #1   Title  Patient will complete hair care with modified independence.     Baseline  difficulty with reaching and completing at eval.     Time  8    Period  Weeks    Status  New    Target Date  10/05/18      OT LONG TERM GOAL #2   Title  Patient will complete shower transfers with modified independence     Baseline  supervision for safety at eval     Time  8    Period  Weeks    Status  New    Target Date  10/05/18      OT LONG TERM GOAL #3   Title  Patient will complete light meal preparation with modified independence     Baseline  unable at eval     Time  12    Period  Weeks    Status  New    Target Date  11/04/18      OT LONG TERM GOAL #4   Title  Patient will complete light housekeeping tasks with modified independence     Baseline  unable at eval     Time  12    Period  Weeks    Status  New    Target Date  11/04/18      OT LONG TERM GOAL #5   Title  Patient will complete money management with bill paying and balancing accounts with 100% accuracy     Baseline  requires min assist at eval     Time  12    Period  Weeks    Status  New    Target Date  11/04/18      Long Term Additional Goals   Additional Long Term Goals  Yes      OT LONG TERM GOAL #6   Title  Patient will tolerate 30 mins of tasks on the computer working towards work tasks without complaints of pain (headache)    Baseline  unable at eval    Time  12    Period  Weeks    Status  New    Target Date  11/04/18            Plan - 09/29/18 1737    Clinical Impression Statement Pt presents today with a 6/10 headache at the start of treatment as a result of completing memory tasks during speech therapy prior to session. Pt continues to  work to Marine scientist, accuracy, and efficiency. Pt worked on Chi[employer]ist tax amounts for [employer]. Pt's headache did  not change during treatment. Pt to continue to develop financial management skills to promote independence during ADLs and IADLs including grocery shopping, home management, and bill pay.    Occupational performance deficits (Please refer to evaluation for details):  ADL's;IADL's;Work;Social Participation    Rehab Potential  Good    Current Impairments/barriers affecting progress:  headaches with electronic devices, works full time on the computer with productivity demands, right visual deficits, impaired cognitive processing, problem solving    OT Frequency  2x / week    OT Duration  12 weeks    OT Treatment/Interventions  Self-care/ADL training;Therapeutic exercise;Visual/perceptual remediation/compensation;Moist Heat;Neuromuscular education;Patient/family education;Therapeutic activities;Manual Therapy;Cognitive remediation/compensation;DME and/or AE instruction    Clinical Decision Making  Limited treatment options, no task modification necessary    Consulted and Agree with Plan of Care  Patient       Patient will benefit from skilled therapeutic intervention in order to improve the following deficits and impairments:  Decreased cognition, Impaired vision/preception, Decreased coordination, Decreased activity tolerance, Decreased strength, Impaired UE functional use  Visit Diagnosis: Cognitive communication deficit    Problem List Patient Active Problem List   Diagnosis Date Noted  . Diverticulitis of colon 06/21/2018  . Sigmoid diverticulitis   . Coronary artery disease 03/09/2018  . Diabetes ([place]) 03/09/2018  . Anxiety, generalized 10/20/2016  . H/O carotid endarterectomy 10/20/2016  . Hyperlipidemia, mixed 10/20/2016  . Pruritic erythematous rash 10/20/2016  . Dependence on nicotine from other tobacco product 07/11/2016  . Tobacco abuse 07/11/2016  . Family history of colon cancer 12/07/2015  . Family history of ovarian cancer 12/07/2015  . Prediabetes 12/04/2015  . Hx of  endarterectomy 11/02/2015  . Essential hypertension 11/02/2015  . Hypertriglyceridemia 09/03/2015  . Adiposity 09/03/2015  . BMI 29.0-29.9,adult 07/17/2015  . Anxiety 07/17/2015  . GERD (gastroesophageal reflux disease) 07/17/2015  . Hot flashes, menopausal 07/17/2015    Oliver Hum, OTS 09/29/2018, 5:44 PM   This entire session was performed under direct supervision and direction of a licensed therapist/therapist assistant . I have personally read, edited and approve of the note as written.  Harrel Carina, MS, OTR/L   [place] MAIN [facility] SERVICES [address] [place], [place], [address] Phone: [phone]   Fax:  [phone]  Name: Courtney King MRN: [id number] Date of Birth: [birth year]

## 2018-09-30 ENCOUNTER — Encounter: Payer: Self-pay | Admitting: Speech Pathology

## 2018-09-30 NOTE — Therapy (Signed)
[place] MAIN [facility] SERVICES [address] [place], [place], [address] Phone: [phone]   Fax:  [phone]  Speech Language Pathology Treatment/Re-Certification  Patient Details  Name: Courtney King MRN: [id number] Date of Birth: [birth year] Referring Provider (SLP): [place], [place] Ander Purpura    Encounter Date: 09/29/2018  End of Session - 09/30/18 1231    Visit Number  12    Number of Visits  28    Date for SLP Re-Evaluation  12/03/18    SLP Start Time  1600    SLP Stop Time   1645    SLP Time Calculation (min)  45 min    Activity Tolerance  Patient tolerated treatment well;Patient limited by pain       Past Medical History:  Diagnosis Date  . Allergy   . Anxiety   . Diabetes mellitus without complication ([place])   . GERD (gastroesophageal reflux disease)   . H/O blood clots 2014   in abdominal aorta - endarterectomy at [facility]  . Hyperlipidemia   . Hypertension   . Left leg claudication [facility])     Past Surgical History:  Procedure Laterality Date  . ABDOMINAL HYSTERECTOMY  2004  . AORTIC ENDARTERECETOMY  2013  . CHOLECYSTECTOMY  1999  . COLONOSCOPY WITH PROPOFOL N/A 04/03/2017   Procedure: COLONOSCOPY WITH PROPOFOL;  Surgeon: Jonathon Bellows, MD;  Location: [facility] ENDOSCOPY;  Service: Endoscopy;  Laterality: N/A;  . ESOPHAGOGASTRODUODENOSCOPY (EGD) WITH PROPOFOL N/A 04/03/2017   Procedure: ESOPHAGOGASTRODUODENOSCOPY (EGD) WITH PROPOFOL;  Surgeon: Jonathon Bellows, MD;  Location: [facility] ENDOSCOPY;  Service: Endoscopy;  Laterality: N/A;  . EUS N/A 04/30/2017   Procedure: FULL UPPER ENDOSCOPIC ULTRASOUND (EUS) RADIAL;  Surgeon: Jola Schmidt, MD;  Location: [facility] ENDOSCOPY;  Service: Endoscopy;  Laterality: N/A;  . FOOT SURGERY Left    plantar fasciitis  . LAPAROSCOPIC NISSEN FUNDOPLICATION  3474   done in [place] to eliminate acid reflux  . LAPAROSCOPIC SIGMOID COLECTOMY N/A 06/29/2018   Procedure: LAPAROSCOPIC SIGMOID COLECTOMY;  Surgeon: Jules Husbands, MD;  Location: [facility] ORS;  Service: General;  Laterality: N/A;  . OOPHORECTOMY Bilateral 2004    There were no vitals filed for this visit.  Subjective Assessment - 09/30/18 1230    Subjective  The patient reports headaches prevent her from pursuing many usual activities            ADULT SLP TREATMENT - 09/30/18 0001      General Information   Behavior/Cognition  Alert;Cooperative;Pleasant mood    HPI  Courtney King is a 52 y.o. female: presents for f/u stroke. 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 Deficits began after surgery on 06/29/2018 but were thought to be 2/2 anesthesia. Pt has followed with neurology. Has appt with ophthamology for visual field deficits this week. Awaiting OT/ Speech eval for aphasia- slow and difficult speech since 8/13.       Treatment Provided   Treatment provided  Cognitive-Linquistic      Pain Assessment   Pain Assessment  0-10    Pain Score  6     Pain Location  Head      Cognitive-Linquistic Treatment   Treatment focused on  Apraxia;Aphasia    Skilled Treatment  HEAD ACHE:  The patient states that she gets a head ache when she reads or concentrates.  Patient stated head ache pain was a 0 before beginning auditory tasks.  At the end of a memory task (learn and recall 10 word list)  the patient stated her pain was a 3.  The patient then completed 3 word finding task (no visual component) with her pain at 4.  The patient completed a second memory task (recall 8 word-list in order) with resultant pain at 6 (or 7).      Assessment / Recommendations / Plan   Plan  Continue with current plan of care      Progression Toward Goals   Progression toward goals  Progressing toward goals       SLP Education - 09/30/18 1230    Education Details  try simple visuospatial tasks to improve tolerance of concentrating on visual stimuli    Person(s) Educated  Patient    Methods  Explanation    Comprehension  Verbalized  understanding         SLP Long Term Goals - 09/30/18 1237      SLP LONG TERM GOAL #1   Title  Patient will generate grammatical, fluent, and cogent sentences to complete abstract/complex linguistic task with 80% accuracy.    Time  8    Period  Weeks    Status  Partially Met    Target Date  12/03/18      SLP LONG TERM GOAL #2   Title  Patient will complete high level word finding tasks with 80% accuracy.    Time  8    Period  Weeks    Status  Partially Met    Target Date  12/03/18      SLP LONG TERM GOAL #3   Title  Patient will write grammatical and cogent sentence to complete abstract/complex linguistic task with 80% accuracy.    Time  8    Period  Weeks    Status  Partially Met    Target Date  12/03/18      SLP LONG TERM GOAL #4   Title  Patient will demonstrate reading comprehension for paragraphs with 80% accuracy.     Time  8    Period  Weeks    Status  Partially Met    Target Date  12/03/18       Plan - 09/30/18 1234    Clinical Impression Statement  The patient continues to have headache when concentrating and when under stress.  Her fluency and word finding are much improved since beginning speech therapy but her ability to concentrate, work on the computer, and comprehend written information remain impaired due to headaches.  Will continue ST to address cognitive linguistic function and speech.         Patient will benefit from skilled therapeutic intervention in order to improve the following deficits and impairments:   Cognitive communication deficit - Plan: SLP plan of care cert/re-cert  Apraxia following nontraumatic intracerebral hemorrhage - Plan: SLP plan of care cert/re-cert    Problem List Patient Active Problem List   Diagnosis Date Noted  . Diverticulitis of colon 06/21/2018  . Sigmoid diverticulitis   . Coronary artery disease 03/09/2018  . Diabetes ([place]) 03/09/2018  . Anxiety, generalized 10/20/2016  . H/O carotid endarterectomy  10/20/2016  . Hyperlipidemia, mixed 10/20/2016  . Pruritic erythematous rash 10/20/2016  . Dependence on nicotine from other tobacco product 07/11/2016  . Tobacco abuse 07/11/2016  . Family history of colon cancer 12/07/2015  . Family history of ovarian cancer 12/07/2015  . Prediabetes 12/04/2015  . Hx of endarterectomy 11/02/2015  . Essential hypertension 11/02/2015  . Hypertriglyceridemia 09/03/2015  . Adiposity 09/03/2015  . BMI 29.0-29.9,adult 07/17/2015  .  Anxiety 07/17/2015  . GERD (gastroesophageal reflux disease) 07/17/2015  . Hot flashes, menopausal 07/17/2015   Leroy Sea, MS/CCC- SLP  Lou Miner 09/30/2018, 12:40 PM  [place] MAIN [facility] SERVICES [address] [place], [place], [address] Phone: [phone]   Fax:  [phone]   Name: MERCIE BALSLEY MRN: [id number] Date of Birth: 10/29/1966

## 2018-10-05 ENCOUNTER — Ambulatory Visit: Payer: Managed Care, Other (non HMO) | Admitting: Occupational Therapy

## 2018-10-05 ENCOUNTER — Ambulatory Visit: Payer: Managed Care, Other (non HMO) | Admitting: Speech Pathology

## 2018-10-05 ENCOUNTER — Encounter: Payer: Self-pay | Admitting: Occupational Therapy

## 2018-10-05 DIAGNOSIS — I69190 Apraxia following nontraumatic intracerebral hemorrhage: Secondary | ICD-10-CM

## 2018-10-05 DIAGNOSIS — R41841 Cognitive communication deficit: Secondary | ICD-10-CM | POA: Diagnosis not present

## 2018-10-05 DIAGNOSIS — R278 Other lack of coordination: Secondary | ICD-10-CM

## 2018-10-05 NOTE — Therapy (Addendum)
[place] MAIN [facility] SERVICES [address] [place], [place], [address] Phone: [phone]   Fax:  [phone]  Occupational Therapy Treatment  Patient Details  Name: Courtney King MRN: [id number] Date of Birth: 1965-12-31 No data recorded  Encounter Date: 10/05/2018  OT End of Session - 10/05/18 1556    Visit Number  10    Number of Visits  24    Date for OT Re-Evaluation  11/04/18    Authorization Type  [employer]    OT Start Time  1500    OT Stop Time  1545    OT Time Calculation (min)  45 min    Activity Tolerance  Patient tolerated treatment well    Behavior During Therapy  [facility] for tasks assessed/performed       Past Medical History:  Diagnosis Date  . Allergy   . Anxiety   . Diabetes mellitus without complication ([place])   . GERD (gastroesophageal reflux disease)   . H/O blood clots 2014   in abdominal aorta - endarterectomy at [facility]  . Hyperlipidemia   . Hypertension   . Left leg claudication [facility])     Past Surgical History:  Procedure Laterality Date  . ABDOMINAL HYSTERECTOMY  2004  . AORTIC ENDARTERECETOMY  2013  . CHOLECYSTECTOMY  1999  . COLONOSCOPY WITH PROPOFOL N/A 04/03/2017   Procedure: COLONOSCOPY WITH PROPOFOL;  Surgeon: Jonathon Bellows, MD;  Location: [facility] ENDOSCOPY;  Service: Endoscopy;  Laterality: N/A;  . ESOPHAGOGASTRODUODENOSCOPY (EGD) WITH PROPOFOL N/A 04/03/2017   Procedure: ESOPHAGOGASTRODUODENOSCOPY (EGD) WITH PROPOFOL;  Surgeon: Jonathon Bellows, MD;  Location: [facility] ENDOSCOPY;  Service: Endoscopy;  Laterality: N/A;  . EUS N/A 04/30/2017   Procedure: FULL UPPER ENDOSCOPIC ULTRASOUND (EUS) RADIAL;  Surgeon: Jola Schmidt, MD;  Location: [facility] ENDOSCOPY;  Service: Endoscopy;  Laterality: N/A;  . FOOT SURGERY Left    plantar fasciitis  . LAPAROSCOPIC NISSEN FUNDOPLICATION  9323   done in [place] to eliminate acid reflux  . LAPAROSCOPIC SIGMOID COLECTOMY N/A 06/29/2018   Procedure: LAPAROSCOPIC SIGMOID COLECTOMY;   Surgeon: Jules Husbands, MD;  Location: [facility] ORS;  Service: General;  Laterality: N/A;  . OOPHORECTOMY Bilateral 2004    There were no vitals filed for this visit.  Subjective Assessment - 10/05/18 1548    Subjective   Pt reports she has recently been cleared by her eye doctor to return to driving.    Pertinent History  Patient reports she went in for surgery on August 13th to have part of her colon removed which was infected.  when she woke up, she realized something was wrong, at first they thought it was the anesthesia.  She was having trouble with her right hand, missing her mouth when trying to take meds.  When she returned to have her staples out on august 27th she had a brain scan to confirm she had a stroke.    Patient Stated Goals  "I want to be able to go back to work" , be independent     Currently in Pain?  Yes    Pain Score  1     Pain Location  Head    Pain Orientation  Left;Right    Pain Descriptors / Indicators  Aching    Pain Type  Neuropathic pain    Pain Onset  More than a month ago    Pain Frequency  Intermittent    Aggravating Factors   Focusing on a task, specifically on a computer screen  Pain Relieving Factors  Rest    Multiple Pain Sites  No         [facility] OT Assessment - 10/05/18 1513      Coordination   Right 9 Hole Peg Test  20    Left 9 Hole Peg Test  22      Hand Function   Right Hand Grip (lbs)  36    Right Hand Lateral Pinch  13 lbs    Right Hand 3 Point Pinch  10 lbs    Left Hand Grip (lbs)  45    Left Hand Lateral Pinch  14 lbs    Left 3 point pinch  12 lbs      Measurements for bilateral grip/pinch and FMC were taken and goals were reviewed and updated to reflect progress thus far.  OT TREATMENT  Self-care: Pt completed 10-key typing task that required her to input numerical data. Pt completed three trials of typing tests. Pt's accuracy and speed were as follows: Trial 1: 14 wpm 99% accuracy Trial 2: 16 wpm 94% accuracy Trial 3: 17  wpm 98% accuracy Pt was educated on strategies increase her typing speed and accuracy such as reciting numerical data aloud as she completed input. When using strategy pt was able to increase typing speed and accuracy. Pt to continue to work on 10-key typing for data entry to prepare her to return to work in February 2020.          OT Education - 10/05/18 1556    Education Details  Typing skills, POC, goals    Person(s) Educated  Patient    Methods  Explanation    Comprehension  Verbalized understanding          OT Long Term Goals - 10/05/18 1601      OT LONG TERM GOAL #1   Title  Patient will complete hair care with modified independence.     Baseline  10/05/2018 - Pt no longer has difficulty with reaching during hair care, however requires more time and her right hand and UE fatigue quickly when opening/closing flat iron and keeping arm sustained in elevation    Time  8    Period  Weeks    Status  On-going    Target Date  10/05/18      OT LONG TERM GOAL #2   Title  Patient will complete shower transfers with modified independence     Baseline  10/05/2018 - Pt no longer requires supervision. Pt does not shower when home alone as a safety precaution, however does not require and assistance nor supervision in the bathroom.    Time  8    Period  Weeks    Status  Achieved    Target Date  10/05/18      OT LONG TERM GOAL #3   Title  Patient will complete light meal preparation with modified independence     Baseline  10/05/2018 - Pt requires supervision during light meal prep as she has previously left the stove on when unattended.    Time  12    Period  Weeks    Status  On-going    Target Date  11/04/18      OT LONG TERM GOAL #4   Title  Patient will complete light housekeeping tasks with modified independence     Baseline  10/05/2018 - Pt requires increased time to complete light housekeeping    Time  12    Period  Weeks  Status  Achieved    Target Date  11/04/18       OT LONG TERM GOAL #5   Title  Patient will complete money management with bill paying and balancing accounts with 100% accuracy     Baseline  10/05/2018 - Pt can complete money management, bill pay, and balancing her account online with 100% accuracy, however demonstrates decreased accuracy when not using computer.    Time  12    Period  Weeks    Status  On-going    Target Date  11/04/18      OT LONG TERM GOAL #6   Title  Patient will tolerate 30 mins of tasks on the computer working towards work tasks without complaints of pain (headache)    Baseline  10/05/2018 - Pt completes 30+ minutes on the computer with a minor headache when display settings are adjusted. Pt experiences increased headaches when using the computer when having to focus/attend to a complex task.    Time  12    Period  Weeks    Status  On-going    Target Date  11/04/18            Plan - 10/05/18 1557    Clinical Impression Statement  Measurements were obtained and goals were reviewed and updated on this date. Pt has made progress in her BUE strength and coordination. Pt presents with a headache of 1/10 today and increasingly more expressive and engaged compared to previous sessions. Pt continues to work to increase typing speed and accuracy when using the 10-key keyboard as she prepares to return to work in February. Pt to continue to work to improve skills related to supporting her independence during ADLs, IADLs, and work.    Occupational Profile and client history currently impacting functional performance  current smoker    Occupational performance deficits (Please refer to evaluation for details):  ADL's;IADL's;Work;Social Participation    Rehab Potential  Good    Current Impairments/barriers affecting progress:  headaches with electronic devices, works full time on the computer with productivity demands, right visual deficits, impaired cognitive processing, problem solving    OT Frequency  2x / week    OT  Duration  12 weeks    OT Treatment/Interventions  Self-care/ADL training;Therapeutic exercise;Visual/perceptual remediation/compensation;Moist Heat;Neuromuscular education;Patient/family education;Therapeutic activities;Manual Therapy;Cognitive remediation/compensation;DME and/or AE instruction    Clinical Decision Making  Limited treatment options, no task modification necessary    Consulted and Agree with Plan of Care  Patient       Patient will benefit from skilled therapeutic intervention in order to improve the following deficits and impairments:  Decreased cognition, Impaired vision/preception, Decreased coordination, Decreased activity tolerance, Decreased strength, Impaired UE functional use  Visit Diagnosis: Other lack of coordination    Problem List Patient Active Problem List   Diagnosis Date Noted  . Diverticulitis of colon 06/21/2018  . Sigmoid diverticulitis   . Coronary artery disease 03/09/2018  . Diabetes ([place]) 03/09/2018  . Anxiety, generalized 10/20/2016  . H/O carotid endarterectomy 10/20/2016  . Hyperlipidemia, mixed 10/20/2016  . Pruritic erythematous rash 10/20/2016  . Dependence on nicotine from other tobacco product 07/11/2016  . Tobacco abuse 07/11/2016  . Family history of colon cancer 12/07/2015  . Family history of ovarian cancer 12/07/2015  . Prediabetes 12/04/2015  . Hx of endarterectomy 11/02/2015  . Essential hypertension 11/02/2015  . Hypertriglyceridemia 09/03/2015  . Adiposity 09/03/2015  . BMI 29.0-29.9,adult 07/17/2015  . Anxiety 07/17/2015  . GERD (gastroesophageal reflux disease) 07/17/2015  .  Hot flashes, menopausal 07/17/2015    Oliver Hum, OTS  10/05/2018, 4:10 PM   This entire session was performed under direct supervision and direction of a licensed therapist/therapist assistant . I have personally read, edited and approve of the note as written.  Harrel Carina, MS, OTR/L   [place]  MAIN [facility] SERVICES [address] [place], [place], [address] Phone: [phone]   Fax:  [phone]  Name: Courtney King MRN: [id number] Date of Birth: 29-Aug-1966

## 2018-10-06 ENCOUNTER — Encounter: Payer: Self-pay | Admitting: Speech Pathology

## 2018-10-06 NOTE — Therapy (Signed)
[place] MAIN [facility] SERVICES [address] [place], [place], [address] Phone: [phone]   Fax:  [phone]  Speech Language Pathology Treatment  Patient Details  Name: Courtney King MRN: [id number] Date of Birth: 1966/08/20 Referring Provider (SLP): [place], [place]    Encounter Date: 10/05/2018  End of Session - 10/06/18 0936    Visit Number  13    Number of Visits  28    Date for SLP Re-Evaluation  12/03/18    SLP Start Time  1400    SLP Stop Time   1500    SLP Time Calculation (min)  60 min    Activity Tolerance  Patient tolerated treatment well;Patient limited by pain       Past Medical History:  Diagnosis Date  . Allergy   . Anxiety   . Diabetes mellitus without complication ([place])   . GERD (gastroesophageal reflux disease)   . H/O blood clots 2014   in abdominal aorta - endarterectomy at [facility]  . Hyperlipidemia   . Hypertension   . Left leg claudication [facility])     Past Surgical History:  Procedure Laterality Date  . ABDOMINAL HYSTERECTOMY  2004  . AORTIC ENDARTERECETOMY  2013  . CHOLECYSTECTOMY  1999  . COLONOSCOPY WITH PROPOFOL N/A 04/03/2017   Procedure: COLONOSCOPY WITH PROPOFOL;  Surgeon: Jonathon Bellows, MD;  Location: [facility] ENDOSCOPY;  Service: Endoscopy;  Laterality: N/A;  . ESOPHAGOGASTRODUODENOSCOPY (EGD) WITH PROPOFOL N/A 04/03/2017   Procedure: ESOPHAGOGASTRODUODENOSCOPY (EGD) WITH PROPOFOL;  Surgeon: Jonathon Bellows, MD;  Location: [facility] ENDOSCOPY;  Service: Endoscopy;  Laterality: N/A;  . EUS N/A 04/30/2017   Procedure: FULL UPPER ENDOSCOPIC ULTRASOUND (EUS) RADIAL;  Surgeon: Jola Schmidt, MD;  Location: [facility] ENDOSCOPY;  Service: Endoscopy;  Laterality: N/A;  . FOOT SURGERY Left    plantar fasciitis  . LAPAROSCOPIC NISSEN FUNDOPLICATION  2263   done in [place] to eliminate acid reflux  . LAPAROSCOPIC SIGMOID COLECTOMY N/A 06/29/2018   Procedure: LAPAROSCOPIC SIGMOID COLECTOMY;  Surgeon: Jules Husbands, MD;   Location: [facility] ORS;  Service: General;  Laterality: N/A;  . OOPHORECTOMY Bilateral 2004    There were no vitals filed for this visit.  Subjective Assessment - 10/06/18 0935    Subjective  The patient reports headaches prevent her from pursuing many usual activities            ADULT SLP TREATMENT - 10/06/18 0001      General Information   Behavior/Cognition  Alert;Cooperative;Pleasant mood    HPI  Courtney King is a 52 y.o. female: presents for f/u stroke. 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 Deficits began after surgery on 06/29/2018 but were thought to be 2/2 anesthesia. Pt has followed with neurology. Has appt with ophthamology for visual field deficits this week. Awaiting OT/ Speech eval for aphasia- slow and difficult speech since 8/13.       Treatment Provided   Treatment provided  Cognitive-Linquistic      Pain Assessment   Pain Assessment  0-10    Pain Score  6     Pain Location  Head      Cognitive-Linquistic Treatment   Treatment focused on  Apraxia;Aphasia    Skilled Treatment  VERBAL EXPRESSION: Maintain conversation with 90% fluency and minimal word finding errors.  Patient did report increased headache pain with the level of abstract/complexity of the conversation.  READING: Read 6 paragraphs and answer questions with 100% accuracy.  Patient reports that she did  not have an increase in headache pain with this relatively easy reading task.  She reports that using a guide under the line she is reading reduces visual strain.  AUDITORY COMPREHENSION: Patient followed a series of verbal commands with 80% accuracy, loosing small details.  Patient inconsistently asks for repetition.      Assessment / Recommendations / Plan   Plan  Continue with current plan of care      Progression Toward Goals   Progression toward goals  Progressing toward goals       SLP Education - 10/06/18 0935    Education Details  stress may be contributing to headache     Person(s) Educated  Patient    Methods  Explanation    Comprehension  Verbalized understanding         SLP Long Term Goals - 09/30/18 1237      SLP LONG TERM GOAL #1   Title  Patient will generate grammatical, fluent, and cogent sentences to complete abstract/complex linguistic task with 80% accuracy.    Time  8    Period  Weeks    Status  Partially Met    Target Date  12/03/18      SLP LONG TERM GOAL #2   Title  Patient will complete high level word finding tasks with 80% accuracy.    Time  8    Period  Weeks    Status  Partially Met    Target Date  12/03/18      SLP LONG TERM GOAL #3   Title  Patient will write grammatical and cogent sentence to complete abstract/complex linguistic task with 80% accuracy.    Time  8    Period  Weeks    Status  Partially Met    Target Date  12/03/18      SLP LONG TERM GOAL #4   Title  Patient will demonstrate reading comprehension for paragraphs with 80% accuracy.     Time  8    Period  Weeks    Status  Partially Met    Target Date  12/03/18       Plan - 10/06/18 1610    Clinical Impression Statement  The patient continues to have headache when concentrating and when under stress.  Her fluency and word finding are much improved since beginning speech therapy but her ability to concentrate, work on the computer, and comprehend written information remain impaired due to headaches.  Will continue ST to address cognitive linguistic function and speech.         Patient will benefit from skilled therapeutic intervention in order to improve the following deficits and impairments:   Cognitive communication deficit  Apraxia following nontraumatic intracerebral hemorrhage    Problem List Patient Active Problem List   Diagnosis Date Noted  . Diverticulitis of colon 06/21/2018  . Sigmoid diverticulitis   . Coronary artery disease 03/09/2018  . Diabetes ([place]) 03/09/2018  . Anxiety, generalized 10/20/2016  . H/O carotid endarterectomy  10/20/2016  . Hyperlipidemia, mixed 10/20/2016  . Pruritic erythematous rash 10/20/2016  . Dependence on nicotine from other tobacco product 07/11/2016  . Tobacco abuse 07/11/2016  . Family history of colon cancer 12/07/2015  . Family history of ovarian cancer 12/07/2015  . Prediabetes 12/04/2015  . Hx of endarterectomy 11/02/2015  . Essential hypertension 11/02/2015  . Hypertriglyceridemia 09/03/2015  . Adiposity 09/03/2015  . BMI 29.0-29.9,adult 07/17/2015  . Anxiety 07/17/2015  . GERD (gastroesophageal reflux disease) 07/17/2015  . Hot flashes, menopausal  07/17/2015   Leroy Sea, MS/CCC- SLP  Lou Miner 10/06/2018, 9:37 AM  [place] MAIN [facility] SERVICES [address] [place], [place], [address] Phone: [phone]   Fax:  [phone]   Name: Courtney King MRN: [id number] Date of Birth: 03/05/1966

## 2018-10-07 ENCOUNTER — Ambulatory Visit: Payer: Managed Care, Other (non HMO) | Admitting: Occupational Therapy

## 2018-10-07 ENCOUNTER — Encounter: Payer: Self-pay | Admitting: Occupational Therapy

## 2018-10-07 ENCOUNTER — Ambulatory Visit: Payer: Managed Care, Other (non HMO) | Admitting: Speech Pathology

## 2018-10-07 DIAGNOSIS — R41841 Cognitive communication deficit: Secondary | ICD-10-CM | POA: Diagnosis not present

## 2018-10-07 DIAGNOSIS — R278 Other lack of coordination: Secondary | ICD-10-CM

## 2018-10-07 DIAGNOSIS — I69190 Apraxia following nontraumatic intracerebral hemorrhage: Secondary | ICD-10-CM

## 2018-10-07 NOTE — Therapy (Addendum)
[place] MAIN [facility] SERVICES [address] [place], [place], [address] Phone: [phone]   Fax:  [phone]  Occupational Therapy Treatment  Patient Details  Name: Courtney King MRN: [id number] Date of Birth: 08/05/1966 No data recorded  Encounter Date: 10/07/2018  OT End of Session - 10/07/18 1710    Visit Number  11    Number of Visits  24    Date for OT Re-Evaluation  11/04/18    Authorization Type  [employer]    OT Start Time  1600    OT Stop Time  1645    OT Time Calculation (min)  45 min    Activity Tolerance  Patient tolerated treatment well    Behavior During Therapy  [facility] for tasks assessed/performed       Past Medical History:  Diagnosis Date  . Allergy   . Anxiety   . Diabetes mellitus without complication ([place])   . GERD (gastroesophageal reflux disease)   . H/O blood clots 2014   in abdominal aorta - endarterectomy at [facility]  . Hyperlipidemia   . Hypertension   . Left leg claudication [facility])     Past Surgical History:  Procedure Laterality Date  . ABDOMINAL HYSTERECTOMY  2004  . AORTIC ENDARTERECETOMY  2013  . CHOLECYSTECTOMY  1999  . COLONOSCOPY WITH PROPOFOL N/A 04/03/2017   Procedure: COLONOSCOPY WITH PROPOFOL;  Surgeon: Jonathon Bellows, MD;  Location: [facility] ENDOSCOPY;  Service: Endoscopy;  Laterality: N/A;  . ESOPHAGOGASTRODUODENOSCOPY (EGD) WITH PROPOFOL N/A 04/03/2017   Procedure: ESOPHAGOGASTRODUODENOSCOPY (EGD) WITH PROPOFOL;  Surgeon: Jonathon Bellows, MD;  Location: [facility] ENDOSCOPY;  Service: Endoscopy;  Laterality: N/A;  . EUS N/A 04/30/2017   Procedure: FULL UPPER ENDOSCOPIC ULTRASOUND (EUS) RADIAL;  Surgeon: Jola Schmidt, MD;  Location: [facility] ENDOSCOPY;  Service: Endoscopy;  Laterality: N/A;  . FOOT SURGERY Left    plantar fasciitis  . LAPAROSCOPIC NISSEN FUNDOPLICATION  1324   done in [place] to eliminate acid reflux  . LAPAROSCOPIC SIGMOID COLECTOMY N/A 06/29/2018   Procedure: LAPAROSCOPIC SIGMOID COLECTOMY;   Surgeon: Jules Husbands, MD;  Location: [facility] ORS;  Service: General;  Laterality: N/A;  . OOPHORECTOMY Bilateral 2004    There were no vitals filed for this visit.  Subjective Assessment - 10/07/18 1617    Subjective   Pt reports she had a bad day yesterday due to her father being in a MVA.    Pertinent History  Patient reports she went in for surgery on August 13th to have part of her colon removed which was infected.  when she woke up, she realized something was wrong, at first they thought it was the anesthesia.  She was having trouble with her right hand, missing her mouth when trying to take meds.  When she returned to have her staples out on august 27th she had a brain scan to confirm she had a stroke.    Patient Stated Goals  "I want to be able to go back to work" , be independent     Currently in Pain?  Yes    Pain Score  4     Pain Location  Head    Pain Orientation  Right    Pain Descriptors / Indicators  Throbbing    Pain Type  Neuropathic pain    Pain Onset  More than a month ago    Pain Frequency  Intermittent      OT TREATMENT  Self-care: Pt completed 10-key typing task that required  her to input numerical data. Pt's typing speed and accuracy averaged 20 wpm with 98 % accuracy. Pt used strategies discussed in previous session without prompting today. When using strategies pt was able to increase typing speed and accuracy. Pt to continue to work on 10-key typing for data entry to prepare her to return to work in February 2020.                    OT Education - 10/07/18 1708    Education Details  Typing skills    Person(s) Educated  Patient    Methods  Explanation    Comprehension  Verbalized understanding          OT Long Term Goals - 10/05/18 1601      OT LONG TERM GOAL #1   Title  Patient will complete hair care with modified independence.     Baseline  10/05/2018 - Pt no longer has difficulty with reaching during hair care, however requires  more time and her right hand and UE fatigue quickly when opening/closing flat iron and keeping arm sustained in elevation    Time  8    Period  Weeks    Status  On-going    Target Date  10/05/18      OT LONG TERM GOAL #2   Title  Patient will complete shower transfers with modified independence     Baseline  10/05/2018 - Pt no longer requires supervision. Pt does not shower when home alone as a safety precaution, however does not require and assistance nor supervision in the bathroom.    Time  8    Period  Weeks    Status  Achieved    Target Date  10/05/18      OT LONG TERM GOAL #3   Title  Patient will complete light meal preparation with modified independence     Baseline  10/05/2018 - Pt requires supervision during light meal prep as she has previously left the stove on when unattended.    Time  12    Period  Weeks    Status  On-going    Target Date  11/04/18      OT LONG TERM GOAL #4   Title  Patient will complete light housekeeping tasks with modified independence     Baseline  10/05/2018 - Pt requires increased time to complete light housekeeping    Time  12    Period  Weeks    Status  Achieved    Target Date  11/04/18      OT LONG TERM GOAL #5   Title  Patient will complete money management with bill paying and balancing accounts with 100% accuracy     Baseline  10/05/2018 - Pt can complete money management, bill pay, and balancing her account online with 100% accuracy, however demonstrates decreased accuracy when not using computer.    Time  12    Period  Weeks    Status  On-going    Target Date  11/04/18      OT LONG TERM GOAL #6   Title  Patient will tolerate 30 mins of tasks on the computer working towards work tasks without complaints of pain (headache)    Baseline  10/05/2018 - Pt completes 30+ minutes on the computer with a minor headache when display settings are adjusted. Pt experiences increased headaches when using the computer when having to focus/attend to  a complex task.    Time  12  Period  Weeks    Status  On-going    Target Date  11/04/18            Plan - 10/07/18 1710    Clinical Impression Statement  Pt continues to work to improve her typing speed and accuracy focusing on numerical data entry. Pt is preparing to return to work in February 2020 where she is expected to complete data entry quickly and accurately. Pt is concerned that she is not prepared to return to work. Pt to continue to work to develop and improve skills for data entry to promote independence and for work re-entry.    Occupational Profile and client history currently impacting functional performance  current smoker    Occupational performance deficits (Please refer to evaluation for details):  ADL's;IADL's;Work;Social Participation    Rehab Potential  Good    Current Impairments/barriers affecting progress:  headaches with electronic devices, works full time on the computer with productivity demands, right visual deficits, impaired cognitive processing, problem solving    OT Frequency  2x / week    OT Duration  12 weeks    OT Treatment/Interventions  Self-care/ADL training;Therapeutic exercise;Visual/perceptual remediation/compensation;Moist Heat;Neuromuscular education;Patient/family education;Therapeutic activities;Manual Therapy;Cognitive remediation/compensation;DME and/or AE instruction    Clinical Decision Making  Limited treatment options, no task modification necessary    Consulted and Agree with Plan of Care  Patient       Patient will benefit from skilled therapeutic intervention in order to improve the following deficits and impairments:  Decreased cognition, Impaired vision/preception, Decreased coordination, Decreased activity tolerance, Decreased strength, Impaired UE functional use  Visit Diagnosis: Other lack of coordination    Problem List Patient Active Problem List   Diagnosis Date Noted  . Diverticulitis of colon 06/21/2018  .  Sigmoid diverticulitis   . Coronary artery disease 03/09/2018  . Diabetes ([place]) 03/09/2018  . Anxiety, generalized 10/20/2016  . H/O carotid endarterectomy 10/20/2016  . Hyperlipidemia, mixed 10/20/2016  . Pruritic erythematous rash 10/20/2016  . Dependence on nicotine from other tobacco product 07/11/2016  . Tobacco abuse 07/11/2016  . Family history of colon cancer 12/07/2015  . Family history of ovarian cancer 12/07/2015  . Prediabetes 12/04/2015  . Hx of endarterectomy 11/02/2015  . Essential hypertension 11/02/2015  . Hypertriglyceridemia 09/03/2015  . Adiposity 09/03/2015  . BMI 29.0-29.9,adult 07/17/2015  . Anxiety 07/17/2015  . GERD (gastroesophageal reflux disease) 07/17/2015  . Hot flashes, menopausal 07/17/2015    Oliver Hum, OTS 10/07/2018, 5:20 PM   This entire session was performed under direct supervision and direction of a licensed therapist/therapist assistant . I have personally read, edited and approve of the note as written.  Harrel Carina, MS, OTR/L   [place] MAIN [facility] SERVICES [address] [place], [place], [address] Phone: [phone]   Fax:  [phone]  Name: LENNYN GANGE MRN: [id number] Date of Birth: 1966-07-28

## 2018-10-08 ENCOUNTER — Encounter: Payer: Self-pay | Admitting: Speech Pathology

## 2018-10-08 NOTE — Therapy (Signed)
[place] MAIN [facility] SERVICES [address] [place], [place], [address] Phone: [phone]   Fax:  [phone]  Speech Language Pathology Treatment  Patient Details  Name: Courtney King MRN: [id number] Date of Birth: [birth year] Referring Provider (SLP): [employer] [place] Ander Purpura    Encounter Date: 10/07/2018  End of Session - 10/08/18 1409    Visit Number  14    Number of Visits  28    Date for SLP Re-Evaluation  12/03/18    SLP Start Time  1500    SLP Stop Time   1545    SLP Time Calculation (min)  45 min    Activity Tolerance  Patient tolerated treatment well;Patient limited by pain       Past Medical History:  Diagnosis Date  . Allergy   . Anxiety   . Diabetes mellitus without complication ([place])   . GERD (gastroesophageal reflux disease)   . H/O blood clots 2014   in abdominal aorta - endarterectomy at [facility]  . Hyperlipidemia   . Hypertension   . Left leg claudication [facility])     Past Surgical History:  Procedure Laterality Date  . ABDOMINAL HYSTERECTOMY  2004  . AORTIC ENDARTERECETOMY  2013  . CHOLECYSTECTOMY  1999  . COLONOSCOPY WITH PROPOFOL N/A 04/03/2017   Procedure: COLONOSCOPY WITH PROPOFOL;  Surgeon: Jonathon Bellows, MD;  Location: [facility] ENDOSCOPY;  Service: Endoscopy;  Laterality: N/A;  . ESOPHAGOGASTRODUODENOSCOPY (EGD) WITH PROPOFOL N/A 04/03/2017   Procedure: ESOPHAGOGASTRODUODENOSCOPY (EGD) WITH PROPOFOL;  Surgeon: Jonathon Bellows, MD;  Location: [facility] ENDOSCOPY;  Service: Endoscopy;  Laterality: N/A;  . EUS N/A 04/30/2017   Procedure: FULL UPPER ENDOSCOPIC ULTRASOUND (EUS) RADIAL;  Surgeon: Jola Schmidt, MD;  Location: [facility] ENDOSCOPY;  Service: Endoscopy;  Laterality: N/A;  . FOOT SURGERY Left    plantar fasciitis  . LAPAROSCOPIC NISSEN FUNDOPLICATION  4034   done in [place] to eliminate acid reflux  . LAPAROSCOPIC SIGMOID COLECTOMY N/A 06/29/2018   Procedure: LAPAROSCOPIC SIGMOID COLECTOMY;  Surgeon: Jules Husbands, MD;   Location: [facility] ORS;  Service: General;  Laterality: N/A;  . OOPHORECTOMY Bilateral 2004    There were no vitals filed for this visit.  Subjective Assessment - 10/08/18 1409    Subjective  The patient reports headaches prevent her from pursuing many usual activities            ADULT SLP TREATMENT - 10/08/18 0001      General Information   Behavior/Cognition  Alert;Cooperative;Pleasant mood    HPI  Courtney King is a 52 y.o. female: presents for f/u stroke. 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 Deficits began after surgery on 06/29/2018 but were thought to be 2/2 anesthesia. Pt has followed with neurology. Has appt with ophthamology for visual field deficits this week. Awaiting OT/ Speech eval for aphasia- slow and difficult speech since 8/13.       Treatment Provided   Treatment provided  Cognitive-Linquistic      Pain Assessment   Pain Assessment  0-10    Pain Score  6     Pain Location  Head      Cognitive-Linquistic Treatment   Treatment focused on  Apraxia;Aphasia    Skilled Treatment  VERBAL EXPRESSION: Maintain conversation with 90% fluency and minimal word finding errors.  Patient reported initial headache pain due to family stresses.  Patient reported reduced level of pain after discussion involving complex/abstract subjects along with lessened stress.      Assessment /  Recommendations / Plan   Plan  Continue with current plan of care      Progression Toward Goals   Progression toward goals  Progressing toward goals       SLP Education - 10/08/18 1409    Education Details  stress relief may aid head pain    Person(s) Educated  Patient    Methods  Explanation    Comprehension  Verbalized understanding         SLP Long Term Goals - 09/30/18 1237      SLP LONG TERM GOAL #1   Title  Patient will generate grammatical, fluent, and cogent sentences to complete abstract/complex linguistic task with 80% accuracy.    Time  8    Period  Weeks     Status  Partially Met    Target Date  12/03/18      SLP LONG TERM GOAL #2   Title  Patient will complete high level word finding tasks with 80% accuracy.    Time  8    Period  Weeks    Status  Partially Met    Target Date  12/03/18      SLP LONG TERM GOAL #3   Title  Patient will write grammatical and cogent sentence to complete abstract/complex linguistic task with 80% accuracy.    Time  8    Period  Weeks    Status  Partially Met    Target Date  12/03/18      SLP LONG TERM GOAL #4   Title  Patient will demonstrate reading comprehension for paragraphs with 80% accuracy.     Time  8    Period  Weeks    Status  Partially Met    Target Date  12/03/18       Plan - 10/08/18 1410    Clinical Impression Statement  The patient continues to have headache when concentrating and when under stress.  Her fluency and word finding are much improved since beginning speech therapy but her ability to concentrate, work on the computer, and comprehend written information remain impaired due to headaches.  Will continue ST to address cognitive linguistic function and speech.      Speech Therapy Frequency  2x / week    Duration  Other (comment)    Treatment/Interventions  Language facilitation;SLP instruction and feedback;Compensatory techniques;Cognitive reorganization;Patient/family education    Potential to Achieve Goals  Good    Potential Considerations  Ability to learn/carryover information;Pain level;Family/community support;Co-morbidities;Previous level of function;Cooperation/participation level;Severity of impairments;Medical prognosis    Consulted and Agree with Plan of Care  Patient       Patient will benefit from skilled therapeutic intervention in order to improve the following deficits and impairments:   Cognitive communication deficit  Apraxia following nontraumatic intracerebral hemorrhage    Problem List Patient Active Problem List   Diagnosis Date Noted  .  Diverticulitis of colon 06/21/2018  . Sigmoid diverticulitis   . Coronary artery disease 03/09/2018  . Diabetes ([place]) 03/09/2018  . Anxiety, generalized 10/20/2016  . H/O carotid endarterectomy 10/20/2016  . Hyperlipidemia, mixed 10/20/2016  . Pruritic erythematous rash 10/20/2016  . Dependence on nicotine from other tobacco product 07/11/2016  . Tobacco abuse 07/11/2016  . Family history of colon cancer 12/07/2015  . Family history of ovarian cancer 12/07/2015  . Prediabetes 12/04/2015  . Hx of endarterectomy 11/02/2015  . Essential hypertension 11/02/2015  . Hypertriglyceridemia 09/03/2015  . Adiposity 09/03/2015  . BMI 29.0-29.9,adult 07/17/2015  . Anxiety 07/17/2015  .  GERD (gastroesophageal reflux disease) 07/17/2015  . Hot flashes, menopausal 07/17/2015   Leroy Sea, MS/CCC- SLP  Courtney King 10/08/2018, 2:11 PM  [place] MAIN [facility] SERVICES [address] [place], [place], [address] Phone: [phone]   Fax:  [phone]   Name: Courtney King MRN: [id number] Date of Birth: [birth year]

## 2018-10-11 ENCOUNTER — Encounter: Payer: Managed Care, Other (non HMO) | Admitting: Speech Pathology

## 2018-10-11 ENCOUNTER — Encounter: Payer: Managed Care, Other (non HMO) | Admitting: Occupational Therapy

## 2018-10-12 ENCOUNTER — Ambulatory Visit: Payer: Managed Care, Other (non HMO) | Admitting: Occupational Therapy

## 2018-10-12 ENCOUNTER — Ambulatory Visit: Payer: Managed Care, Other (non HMO) | Admitting: Speech Pathology

## 2018-10-12 ENCOUNTER — Encounter: Payer: Self-pay | Admitting: Occupational Therapy

## 2018-10-12 DIAGNOSIS — R41841 Cognitive communication deficit: Secondary | ICD-10-CM

## 2018-10-12 DIAGNOSIS — R278 Other lack of coordination: Secondary | ICD-10-CM

## 2018-10-12 DIAGNOSIS — I69190 Apraxia following nontraumatic intracerebral hemorrhage: Secondary | ICD-10-CM

## 2018-10-12 NOTE — Therapy (Signed)
[place] MAIN [facility] SERVICES [address] [place], [place], [address] Phone: [phone]   Fax:  [phone]  Occupational Therapy Treatment  Patient Details  Name: Courtney King MRN: [id number] Date of Birth: 1966/03/22 No data recorded  Encounter Date: 10/12/2018  OT End of Session - 10/12/18 1609    Visit Number  23    Number of Visits  24    Date for OT Re-Evaluation  11/04/18    Authorization Type  [employer]    OT Start Time  1600    OT Stop Time  1645    OT Time Calculation (min)  45 min    Activity Tolerance  Patient tolerated treatment well    Behavior During Therapy  [facility] for tasks assessed/performed       Past Medical History:  Diagnosis Date  . Allergy   . Anxiety   . Diabetes mellitus without complication ([place])   . GERD (gastroesophageal reflux disease)   . H/O blood clots 2014   in abdominal aorta - endarterectomy at [facility]  . Hyperlipidemia   . Hypertension   . Left leg claudication [facility])     Past Surgical History:  Procedure Laterality Date  . ABDOMINAL HYSTERECTOMY  2004  . AORTIC ENDARTERECETOMY  2013  . CHOLECYSTECTOMY  1999  . COLONOSCOPY WITH PROPOFOL N/A 04/03/2017   Procedure: COLONOSCOPY WITH PROPOFOL;  Surgeon: Jonathon Bellows, MD;  Location: [facility] ENDOSCOPY;  Service: Endoscopy;  Laterality: N/A;  . ESOPHAGOGASTRODUODENOSCOPY (EGD) WITH PROPOFOL N/A 04/03/2017   Procedure: ESOPHAGOGASTRODUODENOSCOPY (EGD) WITH PROPOFOL;  Surgeon: Jonathon Bellows, MD;  Location: [facility] ENDOSCOPY;  Service: Endoscopy;  Laterality: N/A;  . EUS N/A 04/30/2017   Procedure: FULL UPPER ENDOSCOPIC ULTRASOUND (EUS) RADIAL;  Surgeon: Jola Schmidt, MD;  Location: [facility] ENDOSCOPY;  Service: Endoscopy;  Laterality: N/A;  . FOOT SURGERY Left    plantar fasciitis  . LAPAROSCOPIC NISSEN FUNDOPLICATION  5188   done in [place] to eliminate acid reflux  . LAPAROSCOPIC SIGMOID COLECTOMY N/A 06/29/2018   Procedure: LAPAROSCOPIC SIGMOID COLECTOMY;   Surgeon: Jules Husbands, MD;  Location: [facility] ORS;  Service: General;  Laterality: N/A;  . OOPHORECTOMY Bilateral 2004    There were no vitals filed for this visit.  Subjective Assessment - 10/12/18 1608    Subjective   Pt reports having a bad day today and feeling discouraged.    Pertinent History  Patient reports she went in for surgery on August 13th to have part of her colon removed which was infected.  when she woke up, she realized something was wrong, at first they thought it was the anesthesia.  She was having trouble with her right hand, missing her mouth when trying to take meds.  When she returned to have her staples out on august 27th she had a brain scan to confirm she had a stroke.    Patient Stated Goals  "I want to be able to go back to work" , be independent     Currently in Pain?  Yes    Pain Score  6     Pain Location  Head    Pain Orientation  Right    Pain Descriptors / Indicators  Throbbing    Pain Type  Neuropathic pain    Pain Onset  More than a month ago    Pain Frequency  Intermittent    Multiple Pain Sites  No       OT TREATMENT  Self-care: Pt completed 10-key typing  task that required her to input numerical data. Pt focused on looking from paper with data to screen while typing. Pt reached a typing speed of 28 wpm with 100% accuracy. Pt used strategies discussed in previous session without prompting today. When using strategies pt was able to increase typing speed and accuracy. Pt to continue to work on 10-key typing for data entry to prepare her to return to work in February 2020. Pt experienced increased pain during tasks today as the environment was noisy and distracting which closely emulates her work environment.       OT Education - 10/12/18 1608    Education Details  Typing skills    Person(s) Educated  Patient    Methods  Explanation    Comprehension  Verbalized understanding          OT Long Term Goals - 10/05/18 1601      OT LONG TERM  GOAL #1   Title  Patient will complete hair care with modified independence.     Baseline  10/05/2018 - Pt no longer has difficulty with reaching during hair care, however requires more time and her right hand and UE fatigue quickly when opening/closing flat iron and keeping arm sustained in elevation    Time  8    Period  Weeks    Status  On-going    Target Date  10/05/18      OT LONG TERM GOAL #2   Title  Patient will complete shower transfers with modified independence     Baseline  10/05/2018 - Pt no longer requires supervision. Pt does not shower when home alone as a safety precaution, however does not require and assistance nor supervision in the bathroom.    Time  8    Period  Weeks    Status  Achieved    Target Date  10/05/18      OT LONG TERM GOAL #3   Title  Patient will complete light meal preparation with modified independence     Baseline  10/05/2018 - Pt requires supervision during light meal prep as she has previously left the stove on when unattended.    Time  12    Period  Weeks    Status  On-going    Target Date  11/04/18      OT LONG TERM GOAL #4   Title  Patient will complete light housekeeping tasks with modified independence     Baseline  10/05/2018 - Pt requires increased time to complete light housekeeping    Time  12    Period  Weeks    Status  Achieved    Target Date  11/04/18      OT LONG TERM GOAL #5   Title  Patient will complete money management with bill paying and balancing accounts with 100% accuracy     Baseline  10/05/2018 - Pt can complete money management, bill pay, and balancing her account online with 100% accuracy, however demonstrates decreased accuracy when not using computer.    Time  12    Period  Weeks    Status  On-going    Target Date  11/04/18      OT LONG TERM GOAL #6   Title  Patient will tolerate 30 mins of tasks on the computer working towards work tasks without complaints of pain (headache)    Baseline  10/05/2018 - Pt  completes 30+ minutes on the computer with a minor headache when display settings are adjusted. Pt experiences increased  headaches when using the computer when having to focus/attend to a complex task.    Time  12    Period  Weeks    Status  On-going    Target Date  11/04/18            Plan - 10/12/18 1609    Clinical Impression Statement  Pt continues to work to improve her typing speed and accuracy focusing on numerical data entry. Pt is preparing to return to work in February 2020 where she is expected to complete data entry quickly and accurately. Pt demonstrated decreased typing speed and accuracy when completing task in a noisy and distracting environment. Pt experienced increasing headache in today's environment. Pt is concerned that she is not prepared to return to work. Pt to continue to work to develop and improve skills for data entry to promote independence and for work re-entry.    Occupational Profile and client history currently impacting functional performance  current smoker    Occupational performance deficits (Please refer to evaluation for details):  ADL's;IADL's;Work;Social Participation    Rehab Potential  Good    Current Impairments/barriers affecting progress:  headaches with electronic devices, works full time on the computer with productivity demands, right visual deficits, impaired cognitive processing, problem solving    OT Frequency  2x / week    OT Duration  12 weeks    OT Treatment/Interventions  Self-care/ADL training;Therapeutic exercise;Visual/perceptual remediation/compensation;Moist Heat;Neuromuscular education;Patient/family education;Therapeutic activities;Manual Therapy;Cognitive remediation/compensation;DME and/or AE instruction    Clinical Decision Making  Limited treatment options, no task modification necessary    Consulted and Agree with Plan of Care  Patient       Patient will benefit from skilled therapeutic intervention in order to improve the  following deficits and impairments:  Decreased cognition, Impaired vision/preception, Decreased coordination, Decreased activity tolerance, Decreased strength, Impaired UE functional use  Visit Diagnosis: Other lack of coordination    Problem List Patient Active Problem List   Diagnosis Date Noted  . Diverticulitis of colon 06/21/2018  . Sigmoid diverticulitis   . Coronary artery disease 03/09/2018  . Diabetes ([place]) 03/09/2018  . Anxiety, generalized 10/20/2016  . H/O carotid endarterectomy 10/20/2016  . Hyperlipidemia, mixed 10/20/2016  . Pruritic erythematous rash 10/20/2016  . Dependence on nicotine from other tobacco product 07/11/2016  . Tobacco abuse 07/11/2016  . Family history of colon cancer 12/07/2015  . Family history of ovarian cancer 12/07/2015  . Prediabetes 12/04/2015  . Hx of endarterectomy 11/02/2015  . Essential hypertension 11/02/2015  . Hypertriglyceridemia 09/03/2015  . Adiposity 09/03/2015  . BMI 29.0-29.9,adult 07/17/2015  . Anxiety 07/17/2015  . GERD (gastroesophageal reflux disease) 07/17/2015  . Hot flashes, menopausal 07/17/2015    Oliver Hum, OTS 10/12/2018, 4:58 PM   This entire session was performed under direct supervision and direction of a licensed therapist/therapist assistant . I have personally read, edited and approve of the note as written.  Harrel Carina, MS, OTR/L   [place] MAIN [facility] SERVICES [address] [place], [place], [address] Phone: [phone]   Fax:  [phone]  Name: Courtney King MRN: [id number] Date of Birth: 03/19/1966

## 2018-10-13 ENCOUNTER — Encounter: Payer: Self-pay | Admitting: Speech Pathology

## 2018-10-13 NOTE — Therapy (Signed)
[place] MAIN [facility] SERVICES [address] [place], [place], [address] Phone: [phone]   Fax:  [phone]  Speech Language Pathology Treatment  Patient Details  Name: Courtney King MRN: [id number] Date of Birth: 08/14/1966 Referring Provider (SLP): [place], [place]    Encounter Date: 10/12/2018  End of Session - 10/13/18 1155    Visit Number  15    Number of Visits  28    Date for SLP Re-Evaluation  12/03/18    SLP Start Time  1500    SLP Stop Time   1550    SLP Time Calculation (min)  50 min    Activity Tolerance  Patient tolerated treatment well;Patient limited by pain       Past Medical History:  Diagnosis Date  . Allergy   . Anxiety   . Diabetes mellitus without complication ([place])   . GERD (gastroesophageal reflux disease)   . H/O blood clots 2014   in abdominal aorta - endarterectomy at [facility]  . Hyperlipidemia   . Hypertension   . Left leg claudication [facility])     Past Surgical History:  Procedure Laterality Date  . ABDOMINAL HYSTERECTOMY  2004  . AORTIC ENDARTERECETOMY  2013  . CHOLECYSTECTOMY  1999  . COLONOSCOPY WITH PROPOFOL N/A 04/03/2017   Procedure: COLONOSCOPY WITH PROPOFOL;  Surgeon: Jonathon Bellows, MD;  Location: [facility] ENDOSCOPY;  Service: Endoscopy;  Laterality: N/A;  . ESOPHAGOGASTRODUODENOSCOPY (EGD) WITH PROPOFOL N/A 04/03/2017   Procedure: ESOPHAGOGASTRODUODENOSCOPY (EGD) WITH PROPOFOL;  Surgeon: Jonathon Bellows, MD;  Location: [facility] ENDOSCOPY;  Service: Endoscopy;  Laterality: N/A;  . EUS N/A 04/30/2017   Procedure: FULL UPPER ENDOSCOPIC ULTRASOUND (EUS) RADIAL;  Surgeon: Jola Schmidt, MD;  Location: [facility] ENDOSCOPY;  Service: Endoscopy;  Laterality: N/A;  . FOOT SURGERY Left    plantar fasciitis  . LAPAROSCOPIC NISSEN FUNDOPLICATION  6962   done in [place] to eliminate acid reflux  . LAPAROSCOPIC SIGMOID COLECTOMY N/A 06/29/2018   Procedure: LAPAROSCOPIC SIGMOID COLECTOMY;  Surgeon: Jules Husbands, MD;   Location: [facility] ORS;  Service: General;  Laterality: N/A;  . OOPHORECTOMY Bilateral 2004    There were no vitals filed for this visit.  Subjective Assessment - 10/13/18 1155    Subjective  The patient reports headaches prevent her from pursuing many usual activities            ADULT SLP TREATMENT - 10/13/18 0001      General Information   Behavior/Cognition  Alert;Cooperative;Pleasant mood    HPI  Courtney King is a 52 y.o. female: presents for f/u stroke. 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 Deficits began after surgery on 06/29/2018 but were thought to be 2/2 anesthesia. Pt has followed with neurology. Has appt with ophthamology for visual field deficits this week. Awaiting OT/ Speech eval for aphasia- slow and difficult speech since 8/13.       Treatment Provided   Treatment provided  Cognitive-Linquistic      Pain Assessment   Pain Assessment  0-10    Pain Score  4     Pain Location  Head      Cognitive-Linquistic Treatment   Treatment focused on  Apraxia;Aphasia    Skilled Treatment  READING: Read and answer math word problems with 95% accuracy.  Patient reports that she minimal increase in headache pain with this relatively easy reading task.  She reports that using a guide under the line she is reading reduces visual strain.  Read  and explain logical solutions/literal meaning questions with 65% accuracy.  MEMORY: Repeat 10-word list after 10 minutes.  VISUAL ATTENTION: Find 13/15 changes in a line drawing.      Assessment / Recommendations / Plan   Plan  Continue with current plan of care      Progression Toward Goals   Progression toward goals  Progressing toward goals       SLP Education - 10/13/18 1155    Education Details  stress relief may aid head pain    Person(s) Educated  Patient    Methods  Explanation    Comprehension  Verbalized understanding         SLP Long Term Goals - 09/30/18 1237      SLP LONG TERM GOAL #1   Title   Patient will generate grammatical, fluent, and cogent sentences to complete abstract/complex linguistic task with 80% accuracy.    Time  8    Period  Weeks    Status  Partially Met    Target Date  12/03/18      SLP LONG TERM GOAL #2   Title  Patient will complete high level word finding tasks with 80% accuracy.    Time  8    Period  Weeks    Status  Partially Met    Target Date  12/03/18      SLP LONG TERM GOAL #3   Title  Patient will write grammatical and cogent sentence to complete abstract/complex linguistic task with 80% accuracy.    Time  8    Period  Weeks    Status  Partially Met    Target Date  12/03/18      SLP LONG TERM GOAL #4   Title  Patient will demonstrate reading comprehension for paragraphs with 80% accuracy.     Time  8    Period  Weeks    Status  Partially Met    Target Date  12/03/18       Plan - 10/13/18 1156    Clinical Impression Statement  The patient continues to have headache when concentrating and when under stress.  Her fluency and word finding are much improved since beginning speech therapy but her ability to concentrate, work on the computer, and comprehend written information remain impaired due to headaches.  Will continue ST to address cognitive linguistic function and speech.      Speech Therapy Frequency  2x / week    Duration  Other (comment)    Treatment/Interventions  Language facilitation;SLP instruction and feedback;Compensatory techniques;Cognitive reorganization;Patient/family education    Potential to Achieve Goals  Good    Potential Considerations  Ability to learn/carryover information;Pain level;Family/community support;Co-morbidities;Previous level of function;Cooperation/participation level;Severity of impairments;Medical prognosis    Consulted and Agree with Plan of Care  Patient       Patient will benefit from skilled therapeutic intervention in order to improve the following deficits and impairments:   Cognitive  communication deficit  Apraxia following nontraumatic intracerebral hemorrhage    Problem List Patient Active Problem List   Diagnosis Date Noted  . Diverticulitis of colon 06/21/2018  . Sigmoid diverticulitis   . Coronary artery disease 03/09/2018  . Diabetes ([place]) 03/09/2018  . Anxiety, generalized 10/20/2016  . H/O carotid endarterectomy 10/20/2016  . Hyperlipidemia, mixed 10/20/2016  . Pruritic erythematous rash 10/20/2016  . Dependence on nicotine from other tobacco product 07/11/2016  . Tobacco abuse 07/11/2016  . Family history of colon cancer 12/07/2015  . Family history of ovarian  cancer 12/07/2015  . Prediabetes 12/04/2015  . Hx of endarterectomy 11/02/2015  . Essential hypertension 11/02/2015  . Hypertriglyceridemia 09/03/2015  . Adiposity 09/03/2015  . BMI 29.0-29.9,adult 07/17/2015  . Anxiety 07/17/2015  . GERD (gastroesophageal reflux disease) 07/17/2015  . Hot flashes, menopausal 07/17/2015   Leroy Sea, MS/CCC- SLP  Lou Miner 10/13/2018, 11:57 AM  [place] MAIN [facility] SERVICES [address] [place], [place], [address] Phone: [phone]   Fax:  [phone]   Name: Courtney King MRN: [id number] Date of Birth: 1966-03-19

## 2018-10-19 ENCOUNTER — Encounter: Payer: Self-pay | Admitting: Speech Pathology

## 2018-10-19 ENCOUNTER — Ambulatory Visit: Payer: Managed Care, Other (non HMO) | Admitting: Occupational Therapy

## 2018-10-21 ENCOUNTER — Ambulatory Visit: Payer: Managed Care, Other (non HMO) | Admitting: Speech Pathology

## 2018-10-21 ENCOUNTER — Ambulatory Visit: Payer: Managed Care, Other (non HMO) | Admitting: Occupational Therapy

## 2018-10-26 ENCOUNTER — Encounter: Payer: Managed Care, Other (non HMO) | Admitting: Speech Pathology

## 2018-10-26 ENCOUNTER — Ambulatory Visit: Payer: Managed Care, Other (non HMO) | Admitting: Occupational Therapy

## 2018-10-28 ENCOUNTER — Encounter: Payer: Managed Care, Other (non HMO) | Admitting: Speech Pathology

## 2018-10-28 ENCOUNTER — Ambulatory Visit: Payer: Managed Care, Other (non HMO) | Admitting: Occupational Therapy

## 2018-11-02 ENCOUNTER — Ambulatory Visit: Payer: Managed Care, Other (non HMO) | Admitting: Speech Pathology

## 2018-11-02 ENCOUNTER — Ambulatory Visit: Payer: Managed Care, Other (non HMO) | Attending: Family Medicine | Admitting: Occupational Therapy

## 2018-11-02 DIAGNOSIS — M6281 Muscle weakness (generalized): Secondary | ICD-10-CM | POA: Insufficient documentation

## 2018-11-02 DIAGNOSIS — I69190 Apraxia following nontraumatic intracerebral hemorrhage: Secondary | ICD-10-CM | POA: Insufficient documentation

## 2018-11-02 DIAGNOSIS — R278 Other lack of coordination: Secondary | ICD-10-CM | POA: Insufficient documentation

## 2018-11-02 DIAGNOSIS — R41841 Cognitive communication deficit: Secondary | ICD-10-CM

## 2018-11-02 NOTE — Therapy (Signed)
[place] MAIN [facility] SERVICES [address] [place], [place], [address] Phone: [phone]   Fax:  [phone]  Patient Details  Name: Courtney King MRN: [id number] Date of Birth: [birth year] Referring Provider:  Luciana Axe, NP  Encounter Date: 11/02/2018   Upon arriving to therapy, pt. reported still being very sick and was hoping to go to the [facility] clinic to see about getting a stronger antibiotic. Pt. was assisted to the [facility] instead of having her therapy session today. Pt. was in agreement.    Harrel Carina, MS, OTR/L 11/02/2018, 3:31 PM  [place] MAIN [facility] SERVICES [address] [place], [place], [address] Phone: [phone]   Fax:  [phone]

## 2018-11-02 NOTE — Therapy (Signed)
[place] MAIN [facility] SERVICES [address] [place], [place], [address] Phone: [phone]   Fax:  [phone]  Patient Details  Name: Courtney King MRN: [id number] Date of Birth: 1966-09-10 Referring Provider:  Luciana Axe, NP  Encounter Date: 11/02/2018   The patient came in time for her OT appointment but was feeling ill.  OT walked her to the clinic.  Leroy Sea, [place], [place] 11/02/2018, 4:02 PM  [place] MAIN [facility] SERVICES [address] [place], [place], [address] Phone: [phone]   Fax:  [phone]

## 2018-11-04 ENCOUNTER — Encounter: Payer: Self-pay | Admitting: Occupational Therapy

## 2018-11-04 ENCOUNTER — Ambulatory Visit: Payer: Managed Care, Other (non HMO) | Admitting: Occupational Therapy

## 2018-11-04 DIAGNOSIS — R278 Other lack of coordination: Secondary | ICD-10-CM

## 2018-11-04 DIAGNOSIS — M6281 Muscle weakness (generalized): Secondary | ICD-10-CM

## 2018-11-04 DIAGNOSIS — I69190 Apraxia following nontraumatic intracerebral hemorrhage: Secondary | ICD-10-CM | POA: Diagnosis present

## 2018-11-04 DIAGNOSIS — R41841 Cognitive communication deficit: Secondary | ICD-10-CM | POA: Diagnosis not present

## 2018-11-04 NOTE — Therapy (Addendum)
[place] MAIN [facility] SERVICES [address] [place], [place], [address] Phone: [phone]   Fax:  [phone]  Occupational Therapy Treatment/Recertification Note  Patient Details  Name: Courtney King MRN: [id number] Date of Birth: 1966-04-01 No data recorded  Encounter Date: 11/04/2018  OT End of Session - 11/04/18 1616    Visit Number  25    Number of Visits  20    Date for OT Re-Evaluation  01/27/19    Authorization Type  [employer]    OT Start Time  1515    OT Stop Time  1600    OT Time Calculation (min)  45 min    Activity Tolerance  Patient tolerated treatment well    Behavior During Therapy  [facility] for tasks assessed/performed       Past Medical History:  Diagnosis Date  . Allergy   . Anxiety   . Diabetes mellitus without complication ([place])   . GERD (gastroesophageal reflux disease)   . H/O blood clots 2014   in abdominal aorta - endarterectomy at [facility]  . Hyperlipidemia   . Hypertension   . Left leg claudication [facility])     Past Surgical History:  Procedure Laterality Date  . ABDOMINAL HYSTERECTOMY  2004  . AORTIC ENDARTERECETOMY  2013  . CHOLECYSTECTOMY  1999  . COLONOSCOPY WITH PROPOFOL N/A 04/03/2017   Procedure: COLONOSCOPY WITH PROPOFOL;  Surgeon: Jonathon Bellows, MD;  Location: [facility] ENDOSCOPY;  Service: Endoscopy;  Laterality: N/A;  . ESOPHAGOGASTRODUODENOSCOPY (EGD) WITH PROPOFOL N/A 04/03/2017   Procedure: ESOPHAGOGASTRODUODENOSCOPY (EGD) WITH PROPOFOL;  Surgeon: Jonathon Bellows, MD;  Location: [facility] ENDOSCOPY;  Service: Endoscopy;  Laterality: N/A;  . EUS N/A 04/30/2017   Procedure: FULL UPPER ENDOSCOPIC ULTRASOUND (EUS) RADIAL;  Surgeon: Jola Schmidt, MD;  Location: [facility] ENDOSCOPY;  Service: Endoscopy;  Laterality: N/A;  . FOOT SURGERY Left    plantar fasciitis  . LAPAROSCOPIC NISSEN FUNDOPLICATION  0630   done in [place] to eliminate acid reflux  . LAPAROSCOPIC SIGMOID COLECTOMY N/A 06/29/2018   Procedure: LAPAROSCOPIC  SIGMOID COLECTOMY;  Surgeon: Jules Husbands, MD;  Location: [facility] ORS;  Service: General;  Laterality: N/A;  . OOPHORECTOMY Bilateral 2004    There were no vitals filed for this visit.  Subjective Assessment - 11/04/18 1614    Subjective   Pt. reports she is finally feeling better, after being sick a long time. Pt. Reports she had to get a stronger antibiotic.   Pertinent History  Patient reports she went in for surgery on August 13th to have part of her colon removed which was infected.  when she woke up, she realized something was wrong, at first they thought it was the anesthesia.  She was having trouble with her right hand, missing her mouth when trying to take meds.  When she returned to have her staples out on august 27th she had a brain scan to confirm she had a stroke.    Patient Stated Goals  "I want to be able to go back to work" , be independent     Currently in Pain?  Yes         [facility] OT Assessment - 11/04/18 1535      Coordination   Right 9 Hole Peg Test  23    Left 9 Hole Peg Test  24      Hand Function   Right Hand Grip (lbs)  10    Right Hand Lateral Pinch  6 lbs  Right Hand 3 Point Pinch  7 lbs    Left Hand Grip (lbs)  20    Left Hand Lateral Pinch  8 lbs    Left 3 point pinch  8 lbs      OT TREATMENT    Measurements were obtained, and goals were reviewed.  Therapeutic Exercise:  Pt. performed gross gripping with grip strengthener. Pt. worked on sustaining grip while grasping pegs and reaching at various heights. The gripper was placed in the resistive slot with the white resistive spring.                    OT Education - 11/04/18 1616    Education Details  UE grip strength, [facility] skills.    Person(s) Educated  Patient    Methods  Explanation    Comprehension  Verbalized understanding          OT Long Term Goals - 11/04/18 1523      OT LONG TERM GOAL #1   Title  Patient will complete hair care with modified independence.      Baseline  11/04/2018 - Pt no longer has difficulty with reaching during hair care, however requires more time and her right hand and UE fatigue quickly when opening/closing flat iron and keeping arm sustained in elevation    Time  8    Period  Weeks    Status  Achieved      OT LONG TERM GOAL #2   Title  Patient will complete shower transfers with modified independence     Baseline  10/05/2018 - Pt no longer requires supervision. Pt does not shower when home alone as a safety precaution, however does not require and assistance nor supervision in the bathroom.    Time  8    Period  Weeks    Status  Achieved      OT LONG TERM GOAL #3   Title  Patient will complete light meal preparation with modified independence     Baseline  11/04/2018 - Pt requires supervision during light meal prep as she has previous    Time  12    Period  Weeks    Status  Achieved    Target Date  --      OT LONG TERM GOAL #4   Title  Patient will complete light housekeeping tasks with modified independence     Baseline  10/05/2018 - Pt. reports being able to complete light housekeeping tasks.    Time  12    Period  Weeks    Status  Achieved      OT LONG TERM GOAL #5   Title  Patient will be able to problem solve counting change, and performing basic financial interactions with 100% accuracy.    Baseline  11/04/2018 - Pt can complete money management, bill pay, and balancing her account online with 100% accuracy, however demonstrates decreased accuracy when not using computer.    Time  12    Period  Weeks    Status  Revised    Target Date  01/27/19      Long Term Additional Goals   Additional Long Term Goals  Yes      OT LONG TERM GOAL #6   Title  Patient will tolerate 30 mins of tasks on the computer working towards work tasks without complaints of pain (headache)    Baseline  11/04/2018:  Pt. is able to tolerate limited computer time for basic computer tasks/Bill Pay. Headaches  limit her ability to  tolerate any additional computer use.     Time  12    Period  Weeks    Status  Not Met    Target Date  11/04/18      OT LONG TERM GOAL #7   Title  Pt. will increase bilateral grip strength by 5# to be able to wring out a towel.    Baseline  11/04/2018: Bilateral grip strength is weaker.Pt. is unable use her hands to wring out a towel.    Time  12    Period  Weeks    Status  New    Target Date  01/27/19      OT LONG TERM GOAL #8   Title  Pt. ill increase bilateral lateral pinch strength by 2# to be able to open medication bottles, and jars.    Baseline  11/04/2018: Pt. is unable to open medication bottles, and jars.    Time  12    Period  Weeks    Status  New    Target Date  01/27/19      OT LONG TERM GOAL  #9   Baseline  Pt. will be independent with cutting meat    Time  12    Period  Weeks    Status  New    Target Date  01/27/19      OT LONG TERM GOAL  #10   TITLE  Pt. will improve bilateral [facility] skills to be able to manipulate buttons    Baseline  11/04/2018: Pt. has difficulty manipulating buttons    Time  12    Period  Weeks    Status  New    Target Date  01/27/19      OT LONG TERM GOAL  #11   TITLE  Pt. will be independently, and efficiently write 3 sentences with 100% legibility.    Baseline  11/04/2018: Pt. has difficulty with writing    Time  12    Period  Weeks    Status  New    Target Date  01/27/19            Plan - 11/04/18 1617    Clinical Impression Statement Pt. has been sick since the last treatment session a month ago. Pt. presents with increased  Bilateral UE strength, grip strength, 3pt., lateral pinch strength, and [place] skills. Mam-20 For Neurological Conditions sum score 63/80. Pt. has difficulty using her hands to cut meat, wring out a towel, open medication bottles, butoning, opening jars, picking up, handling a 1/2 full pitcher, and writing.  Pt. continues to have difficulty problem solving through making change efficiently during financial  interactions. This is worse when the pt. is distracted. Pt. continues to need OT services to work on improving UE strength, and [facility] skills in order to be able to maximize independence with ADLS, and IADLs.   Occupational performance deficits (Please refer to evaluation for details):  ADL's;IADL's;Work;Social Participation    Rehab Potential  Good    Current Impairments/barriers affecting progress:  headaches with electronic devices, works full time on the computer with productivity demands, right visual deficits, impaired cognitive processing, problem solving    OT Frequency  2x / week    OT Duration  12 weeks    OT Treatment/Interventions  Self-care/ADL training;Therapeutic exercise;Visual/perceptual remediation/compensation;Moist Heat;Neuromuscular education;Patient/family education;Therapeutic activities;Manual Therapy;Cognitive remediation/compensation;DME and/or AE instruction    Clinical Decision Making  Limited treatment options, no task modification necessary    Consulted and Agree  with Plan of Care  Patient       Patient will benefit from skilled therapeutic intervention in order to improve the following deficits and impairments:  Decreased cognition, Impaired vision/preception, Decreased coordination, Decreased activity tolerance, Decreased strength, Impaired UE functional use  Visit Diagnosis: Muscle weakness (generalized)  Other lack of coordination    Problem List Patient Active Problem List   Diagnosis Date Noted  . Diverticulitis of colon 06/21/2018  . Sigmoid diverticulitis   . Coronary artery disease 03/09/2018  . Diabetes ([place]) 03/09/2018  . Anxiety, generalized 10/20/2016  . H/O carotid endarterectomy 10/20/2016  . Hyperlipidemia, mixed 10/20/2016  . Pruritic erythematous rash 10/20/2016  . Dependence on nicotine from other tobacco product 07/11/2016  . Tobacco abuse 07/11/2016  . Family history of colon cancer 12/07/2015  . Family history of ovarian cancer  12/07/2015  . Prediabetes 12/04/2015  . Hx of endarterectomy 11/02/2015  . Essential hypertension 11/02/2015  . Hypertriglyceridemia 09/03/2015  . Adiposity 09/03/2015  . BMI 29.0-29.9,adult 07/17/2015  . Anxiety 07/17/2015  . GERD (gastroesophageal reflux disease) 07/17/2015  . Hot flashes, menopausal 07/17/2015    Harrel Carina, MS, OTR/L 11/04/2018, 5:02 PM  [place] MAIN [facility] SERVICES [address] [place], [place], [address] Phone: [phone]   Fax:  [phone]  Name: Courtney King MRN: [id number] Date of Birth: Feb 12, 1966

## 2018-11-08 ENCOUNTER — Ambulatory Visit: Payer: Managed Care, Other (non HMO) | Admitting: Speech Pathology

## 2018-11-11 ENCOUNTER — Ambulatory Visit: Payer: Managed Care, Other (non HMO) | Admitting: Occupational Therapy

## 2018-11-16 ENCOUNTER — Encounter: Payer: Self-pay | Admitting: Speech Pathology

## 2018-11-16 ENCOUNTER — Encounter: Payer: Self-pay | Admitting: Occupational Therapy

## 2018-11-16 ENCOUNTER — Ambulatory Visit: Payer: Managed Care, Other (non HMO) | Admitting: Speech Pathology

## 2018-11-16 ENCOUNTER — Ambulatory Visit: Payer: Managed Care, Other (non HMO) | Admitting: Occupational Therapy

## 2018-11-16 DIAGNOSIS — R278 Other lack of coordination: Secondary | ICD-10-CM

## 2018-11-16 DIAGNOSIS — I69190 Apraxia following nontraumatic intracerebral hemorrhage: Secondary | ICD-10-CM

## 2018-11-16 DIAGNOSIS — M6281 Muscle weakness (generalized): Secondary | ICD-10-CM

## 2018-11-16 DIAGNOSIS — R41841 Cognitive communication deficit: Secondary | ICD-10-CM

## 2018-11-16 NOTE — Therapy (Signed)
[name] MAIN [facility] SERVICES [address] [place], [place], [address] Phone: [phone]   Fax:  [phone]  Speech Language Pathology Treatment  Patient Details  Name: Courtney King MRN: [id number] Date of Birth: [birth year] Referring Provider (SLP): [place], [place] Ander Purpura    Encounter Date: 11/16/2018  End of Session - 11/16/18 1658    Visit Number  16    Number of Visits  28    Date for SLP Re-Evaluation  12/03/18    SLP Start Time  1600    SLP Stop Time   7253    SLP Time Calculation (min)  47 min    Activity Tolerance  Patient tolerated treatment well       Past Medical History:  Diagnosis Date  . Allergy   . Anxiety   . Diabetes mellitus without complication ([place])   . GERD (gastroesophageal reflux disease)   . H/O blood clots 2014   in abdominal aorta - endarterectomy at [facility]  . Hyperlipidemia   . Hypertension   . Left leg claudication [facility])     Past Surgical History:  Procedure Laterality Date  . ABDOMINAL HYSTERECTOMY  2004  . AORTIC ENDARTERECETOMY  2013  . CHOLECYSTECTOMY  1999  . COLONOSCOPY WITH PROPOFOL N/A 04/03/2017   Procedure: COLONOSCOPY WITH PROPOFOL;  Surgeon: Jonathon Bellows, MD;  Location[facility] ENDOSCOPY;  Service: Endoscopy;  Laterality: N/A;  . ESOPHAGOGASTRODUODENOSCOPY (EGD) WITH PROPOFOL N/A 04/03/2017   Procedure: ESOPHAGOGASTRODUODENOSCOPY (EGD) WITH PROPOFOL;  Surgeon: Jonathon Bellows, MD;  Location: [facility] ENDOSCOPY;  Service: Endoscopy;  Laterality: N/A;  . EUS N/A 04/30/2017   Procedure: FULL UPPER ENDOSCOPIC ULTRASOUND (EUS) RADIAL;  Surgeon: Jola Schmidt, MD;  Location: [facility] ENDOSCOPY;  Service: Endoscopy;  Laterality: N/A;  . FOOT SURGERY Left    plantar fasciitis  . LAPAROSCOPIC NISSEN FUNDOPLICATION  6644   done in [place] to eliminate acid reflux  . LAPAROSCOPIC SIGMOID COLECTOMY N/A 06/29/2018   Procedure: LAPAROSCOPIC SIGMOID COLECTOMY;  Surgeon: Jules Husbands, MD;  Location: [facility] ORS;  Service:  General;  Laterality: N/A;  . OOPHORECTOMY Bilateral 2004    There were no vitals filed for this visit.  Subjective Assessment - 11/16/18 1657    Subjective  The patient has been ill, but is feeling recovered now            ADULT SLP TREATMENT - 11/16/18 0001      General Information   Behavior/Cognition  Alert;Cooperative;Pleasant mood    HPI  Courtney King is a 52 y.o. female: presents for f/u stroke. 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 Deficits began after surgery on 06/29/2018 but were thought to be 2/2 anesthesia. Pt has followed with neurology. Has appt with ophthamology for visual field deficits this week. Awaiting OT/ Speech eval for aphasia- slow and difficult speech since 8/13.       Treatment Provided   Treatment provided  Cognitive-Linquistic      Pain Assessment   Pain Assessment  0-10    Pain Score  3     Pain Location  Head      Cognitive-Linquistic Treatment   Treatment focused on  Apraxia;Aphasia    Skilled Treatment  The patient has missed a month of speech therapy secondary insurance issues and illness.  VERBAL EXPRESSION: Maintain conversation with 70% fluency initially, improving to 90% as session progressed.  Patient demonstrated minimal overt word finding errors in conversation.  Patient completed a variety of reading and linguistic (  identify commonalities, complete verbal analogies, select whole given part) tasks with overall 85% accuracy.      Assessment / Recommendations / Plan   Plan  Continue with current plan of care      Progression Toward Goals   Progression toward goals  Progressing toward goals       SLP Education - 11/16/18 1658    Education Details  remember to take your time with speech and word finding    Person(s) Educated  Patient    Methods  Explanation    Comprehension  Verbalized understanding         SLP Long Term Goals - 09/30/18 1237      SLP LONG TERM GOAL #1   Title  Patient will generate  grammatical, fluent, and cogent sentences to complete abstract/complex linguistic task with 80% accuracy.    Time  8    Period  Weeks    Status  Partially Met    Target Date  12/03/18      SLP LONG TERM GOAL #2   Title  Patient will complete high level word finding tasks with 80% accuracy.    Time  8    Period  Weeks    Status  Partially Met    Target Date  12/03/18      SLP LONG TERM GOAL #3   Title  Patient will write grammatical and cogent sentence to complete abstract/complex linguistic task with 80% accuracy.    Time  8    Period  Weeks    Status  Partially Met    Target Date  12/03/18      SLP LONG TERM GOAL #4   Title  Patient will demonstrate reading comprehension for paragraphs with 80% accuracy.     Time  8    Period  Weeks    Status  Partially Met    Target Date  12/03/18       Plan - 11/16/18 1700    Clinical Impression Statement  The patient continues to have headache when concentrating and when under stress.  Her fluency and word finding are much improved since beginning speech therapy but her ability to concentrate, work on the computer, and comprehend written information remain impaired due to headaches.  Will continue ST to address cognitive linguistic function and speech.      Speech Therapy Frequency  2x / week    Duration  Other (comment)    Treatment/Interventions  Language facilitation;SLP instruction and feedback;Compensatory techniques;Cognitive reorganization;Patient/family education    Potential to Achieve Goals  Good    Potential Considerations  Ability to learn/carryover information;Pain level;Family/community support;Co-morbidities;Previous level of function;Cooperation/participation level;Severity of impairments;Medical prognosis    SLP Home Exercise Plan  word finding work Financial trader and Agree with Plan of Care  Patient       Patient will benefit from skilled therapeutic intervention in order to improve the following deficits and  impairments:   Cognitive communication deficit  Apraxia following nontraumatic intracerebral hemorrhage    Problem List Patient Active Problem List   Diagnosis Date Noted  . Diverticulitis of colon 06/21/2018  . Sigmoid diverticulitis   . Coronary artery disease 03/09/2018  . Diabetes ([place]) 03/09/2018  . Anxiety, generalized 10/20/2016  . H/O carotid endarterectomy 10/20/2016  . Hyperlipidemia, mixed 10/20/2016  . Pruritic erythematous rash 10/20/2016  . Dependence on nicotine from other tobacco product 07/11/2016  . Tobacco abuse 07/11/2016  . Family history of colon cancer 12/07/2015  . Family  history of ovarian cancer 12/07/2015  . Prediabetes 12/04/2015  . Hx of endarterectomy 11/02/2015  . Essential hypertension 11/02/2015  . Hypertriglyceridemia 09/03/2015  . Adiposity 09/03/2015  . BMI 29.0-29.9,adult 07/17/2015  . Anxiety 07/17/2015  . GERD (gastroesophageal reflux disease) 07/17/2015  . Hot flashes, menopausal 07/17/2015   Leroy Sea, MS/CCC- SLP  Lou Miner 11/16/2018, 5:01 PM  [place] MAIN [facility] SERVICES [address] [place], [place], [address] Phone: [phone]   Fax:  [phone]   Name: Courtney King MRN: [id number] Date of Birth: 12/31/1965

## 2018-11-16 NOTE — Therapy (Signed)
[place] MAIN [facility] SERVICES [address] [place], [place], [address] Phone: [phone]   Fax:  [phone]  Occupational Therapy Treatment  Patient Details  Name: Courtney King MRN: [id number] Date of Birth: 1965/11/23 No data recorded  Encounter Date: 11/16/2018  OT End of Session - 11/16/18 1611    Visit Number  26    Number of Visits  93    Date for OT Re-Evaluation  01/27/19    OT Start Time  1515    OT Stop Time  1600    OT Time Calculation (min)  45 min    Equipment Utilized During Treatment  Money manipulatives    Activity Tolerance  Patient tolerated treatment well    Behavior During Therapy  [facility] for tasks assessed/performed       Past Medical History:  Diagnosis Date  . Allergy   . Anxiety   . Diabetes mellitus without complication ([place])   . GERD (gastroesophageal reflux disease)   . H/O blood clots 2014   in abdominal aorta - endarterectomy at [facility]  . Hyperlipidemia   . Hypertension   . Left leg claudication [facility])     Past Surgical History:  Procedure Laterality Date  . ABDOMINAL HYSTERECTOMY  2004  . AORTIC ENDARTERECETOMY  2013  . CHOLECYSTECTOMY  1999  . COLONOSCOPY WITH PROPOFOL N/A 04/03/2017   Procedure: COLONOSCOPY WITH PROPOFOL;  Surgeon: Jonathon Bellows, MD;  Location: [facility] ENDOSCOPY;  Service: Endoscopy;  Laterality: N/A;  . ESOPHAGOGASTRODUODENOSCOPY (EGD) WITH PROPOFOL N/A 04/03/2017   Procedure: ESOPHAGOGASTRODUODENOSCOPY (EGD) WITH PROPOFOL;  Surgeon: Jonathon Bellows, MD;  Location: [facility] ENDOSCOPY;  Service: Endoscopy;  Laterality: N/A;  . EUS N/A 04/30/2017   Procedure: FULL UPPER ENDOSCOPIC ULTRASOUND (EUS) RADIAL;  Surgeon: Jola Schmidt, MD;  Location: [facility] ENDOSCOPY;  Service: Endoscopy;  Laterality: N/A;  . FOOT SURGERY Left    plantar fasciitis  . LAPAROSCOPIC NISSEN FUNDOPLICATION  4034   done in [place] to eliminate acid reflux  . LAPAROSCOPIC SIGMOID COLECTOMY N/A 06/29/2018   Procedure:  LAPAROSCOPIC SIGMOID COLECTOMY;  Surgeon: Jules Husbands, MD;  Location: [facility] ORS;  Service: General;  Laterality: N/A;  . OOPHORECTOMY Bilateral 2004    There were no vitals filed for this visit.  Subjective Assessment - 11/16/18 1610    Subjective   Pt. reports she notices her speech has not been as good.    Pertinent History  Patient reports she went in for surgery on August 13th to have part of her colon removed which was infected.  when she woke up, she realized something was wrong, at first they thought it was the anesthesia.  She was having trouble with her right hand, missing her mouth when trying to take meds.  When she returned to have her staples out on august 27th she had a brain scan to confirm she had a stroke.    Patient Stated Goals  "I want to be able to go back to work" , be independent     Currently in Pain?  No/denies      OT TREATMENT    Neuro muscular re-education:  Pt. worked on tasks to sustain rich lateral pinch on resistive tweezers while grasping and moving 2" toothpick sticks from a horizontal flat position to a vertical position in order to place it in the holder. Pt. was able to sustain grasp while positioning and extending the wrist/hand in the necessary alignment needed to place the stick through the top of  the holder.  Therapeutic Exercise:  Pt. performed gross gripping with grip strengthener. Pt. worked on sustaining grip while grasping pegs and reaching at various heights.  The gripper was placed in the 2nd resistive slot with the white resistive spring. The resistance was moved to the 1st resistive slot 1/2 way through. Pt. Worked on pinch strengthening in the right hand for lateral, and 3pt. pinch using yellow, red, and green resistive clips. Pt. worked on placing the clips at various vertical and horizontal angles. Tactile and verbal cues were required for eliciting the desired movement.                        OT Education - 11/16/18  1611    Education Details  UE grip strength, [facility] skills.    Person(s) Educated  Patient    Methods  Explanation    Comprehension  Verbalized understanding          OT Long Term Goals - 11/04/18 1523      OT LONG TERM GOAL #1   Title  Patient will complete hair care with modified independence.     Baseline  11/04/2018 - Pt no longer has difficulty with reaching during hair care, however requires more time and her right hand and UE fatigue quickly when opening/closing flat iron and keeping arm sustained in elevation    Time  8    Period  Weeks    Status  Achieved      OT LONG TERM GOAL #2   Title  Patient will complete shower transfers with modified independence     Baseline  10/05/2018 - Pt no longer requires supervision. Pt does not shower when home alone as a safety precaution, however does not require and assistance nor supervision in the bathroom.    Time  8    Period  Weeks    Status  Achieved      OT LONG TERM GOAL #3   Title  Patient will complete light meal preparation with modified independence     Baseline  11/04/2018 - Pt requires supervision during light meal prep as she has previous    Time  12    Period  Weeks    Status  Achieved    Target Date  --      OT LONG TERM GOAL #4   Title  Patient will complete light housekeeping tasks with modified independence     Baseline  10/05/2018 - Pt. reports being able to complete light housekeeping tasks.    Time  12    Period  Weeks    Status  Achieved      OT LONG TERM GOAL #5   Title  Patient will be able to problem solve counting change, and performing basic financial interactions with 100% accuracy.    Baseline  11/04/2018 - Pt can complete money management, bill pay, and balancing her account online with 100% accuracy, however demonstrates decreased accuracy when not using computer.    Time  12    Period  Weeks    Status  Revised    Target Date  01/27/19      Long Term Additional Goals   Additional Long Term  Goals  Yes      OT LONG TERM GOAL #6   Title  Patient will tolerate 30 mins of tasks on the computer working towards work tasks without complaints of pain (headache)    Baseline  11/04/2018:  Pt. is able  to tolerate limited computer time for basic computer tasks/Bill Pay. Headaches limit her ability to tolerate any additional computer use.     Time  12    Period  Weeks    Status  Not Met    Target Date  11/04/18      OT LONG TERM GOAL #7   Title  Pt. will increase bilateral grip strength by 5# to be able to wring out a towel.    Baseline  11/04/2018: Bilateral grip strength is weaker.Pt. is unable use her hands to wring out a towel.    Time  12    Period  Weeks    Status  New    Target Date  01/27/19      OT LONG TERM GOAL #8   Title  Pt. ill increase bilateral lateral pinch strength by 2# to be able to open medication bottles, and jars.    Baseline  11/04/2018: Pt. is unable to open medication bottles, and jars.    Time  12    Period  Weeks    Status  New    Target Date  01/27/19      OT LONG TERM GOAL  #9   Baseline  Pt. will be independent with cutting meat    Time  12    Period  Weeks    Status  New    Target Date  01/27/19      OT LONG TERM GOAL  #10   TITLE  Pt. will improve bilateral [facility] skills to be able to manipulate buttons    Baseline  11/04/2018: Pt. has difficulty manipulating buttons    Time  12    Period  Weeks    Status  New    Target Date  01/27/19      OT LONG TERM GOAL  #11   TITLE  Pt. will be independently, and efficiently write 3 sentences with 100% legibility.    Baseline  11/04/2018: Pt. has difficulty with writing    Time  12    Period  Weeks    Status  New    Target Date  01/27/19            Plan - 11/16/18 1612    Clinical Impression Statement Pt. presents with limited right hand grip strength, pinch strength, and [place] skills which limits her ability to cut meat, wring out a toweal open medication bottles, perform buttoning, opening  jars, and writing tasks. Pt. continues to work on improving RUE strength, and [facility] skills for improved UE functioning, and improved independence with ADLs, and IADLs.    Occupational performance deficits (Please refer to evaluation for details):  ADL's;IADL's;Work;Social Participation    Rehab Potential  Good    Current Impairments/barriers affecting progress:  headaches with electronic devices, works full time on the computer with productivity demands, right visual deficits, impaired cognitive processing, problem solving    OT Frequency  2x / week    OT Duration  12 weeks    OT Treatment/Interventions  Self-care/ADL training;Therapeutic exercise;Visual/perceptual remediation/compensation;Moist Heat;Neuromuscular education;Patient/family education;Therapeutic activities;Manual Therapy;Cognitive remediation/compensation;DME and/or AE instruction    Clinical Decision Making  Limited treatment options, no task modification necessary    Consulted and Agree with Plan of Care  Patient       Patient will benefit from skilled therapeutic intervention in order to improve the following deficits and impairments:  Decreased cognition, Impaired vision/preception, Decreased coordination, Decreased activity tolerance, Decreased strength, Impaired UE functional use  Visit Diagnosis:  Muscle weakness (generalized)  Other lack of coordination    Problem List Patient Active Problem List   Diagnosis Date Noted  . Diverticulitis of colon 06/21/2018  . Sigmoid diverticulitis   . Coronary artery disease 03/09/2018  . Diabetes ([place]) 03/09/2018  . Anxiety, generalized 10/20/2016  . H/O carotid endarterectomy 10/20/2016  . Hyperlipidemia, mixed 10/20/2016  . Pruritic erythematous rash 10/20/2016  . Dependence on nicotine from other tobacco product 07/11/2016  . Tobacco abuse 07/11/2016  . Family history of colon cancer 12/07/2015  . Family history of ovarian cancer 12/07/2015  . Prediabetes 12/04/2015  . Hx  of endarterectomy 11/02/2015  . Essential hypertension 11/02/2015  . Hypertriglyceridemia 09/03/2015  . Adiposity 09/03/2015  . BMI 29.0-29.9,adult 07/17/2015  . Anxiety 07/17/2015  . GERD (gastroesophageal reflux disease) 07/17/2015  . Hot flashes, menopausal 07/17/2015    Harrel Carina, MS, OTR/L 11/16/2018, 4:17 PM  [place] MAIN [facility] SERVICES [address] [place], [place], [address] Phone: [phone]   Fax:  [phone]  Name: Courtney King MRN: [id number] Date of Birth: [birth year]

## 2018-11-18 ENCOUNTER — Encounter: Payer: Self-pay | Admitting: Occupational Therapy

## 2018-11-18 ENCOUNTER — Ambulatory Visit: Payer: Managed Care, Other (non HMO) | Attending: Family Medicine | Admitting: Occupational Therapy

## 2018-11-18 ENCOUNTER — Ambulatory Visit: Payer: Managed Care, Other (non HMO) | Admitting: Speech Pathology

## 2018-11-18 DIAGNOSIS — R41841 Cognitive communication deficit: Secondary | ICD-10-CM | POA: Insufficient documentation

## 2018-11-18 DIAGNOSIS — I69190 Apraxia following nontraumatic intracerebral hemorrhage: Secondary | ICD-10-CM | POA: Diagnosis present

## 2018-11-18 DIAGNOSIS — R4701 Aphasia: Secondary | ICD-10-CM

## 2018-11-18 DIAGNOSIS — M6281 Muscle weakness (generalized): Secondary | ICD-10-CM | POA: Insufficient documentation

## 2018-11-18 DIAGNOSIS — R278 Other lack of coordination: Secondary | ICD-10-CM | POA: Diagnosis present

## 2018-11-18 NOTE — Therapy (Signed)
[place] MAIN [facility] SERVICES [address] [place], [place], [address] Phone: [phone]   Fax:  [phone]  Occupational Therapy Treatment  Patient Details  Name: Courtney King MRN: [id number] Date of Birth: 07-Jun-1966 No data recorded  Encounter Date: 11/18/2018  OT End of Session - 11/18/18 1718    Visit Number  27    Number of Visits  39    Date for OT Re-Evaluation  01/27/19    Authorization Type  [employer]    OT Start Time  1645    OT Stop Time  1730    OT Time Calculation (min)  45 min    Activity Tolerance  Patient tolerated treatment well    Behavior During Therapy  [facility] for tasks assessed/performed       Past Medical History:  Diagnosis Date  . Allergy   . Anxiety   . Diabetes mellitus without complication ([place])   . GERD (gastroesophageal reflux disease)   . H/O blood clots 2014   in abdominal aorta - endarterectomy at [facility]  . Hyperlipidemia   . Hypertension   . Left leg claudication [facility])     Past Surgical History:  Procedure Laterality Date  . ABDOMINAL HYSTERECTOMY  2004  . AORTIC ENDARTERECETOMY  2013  . CHOLECYSTECTOMY  1999  . COLONOSCOPY WITH PROPOFOL N/A 04/03/2017   Procedure: COLONOSCOPY WITH PROPOFOL;  Surgeon: Jonathon Bellows, MD;  Location[facility] ENDOSCOPY;  Service: Endoscopy;  Laterality: N/A;  . ESOPHAGOGASTRODUODENOSCOPY (EGD) WITH PROPOFOL N/A 04/03/2017   Procedure: ESOPHAGOGASTRODUODENOSCOPY (EGD) WITH PROPOFOL;  Surgeon: Jonathon Bellows, MD;  Location: [facility] ENDOSCOPY;  Service: Endoscopy;  Laterality: N/A;  . EUS N/A 04/30/2017   Procedure: FULL UPPER ENDOSCOPIC ULTRASOUND (EUS) RADIAL;  Surgeon: Jola Schmidt, MD;  Location: [facility] ENDOSCOPY;  Service: Endoscopy;  Laterality: N/A;  . FOOT SURGERY Left    plantar fasciitis  . LAPAROSCOPIC NISSEN FUNDOPLICATION  0093   done in [place] to eliminate acid reflux  . LAPAROSCOPIC SIGMOID COLECTOMY N/A 06/29/2018   Procedure: LAPAROSCOPIC SIGMOID COLECTOMY;   Surgeon: Jules Husbands, MD;  Location: [facility] ORS;  Service: General;  Laterality: N/A;  . OOPHORECTOMY Bilateral 2004    There were no vitals filed for this visit.  Subjective Assessment - 11/18/18 1716    Subjective   Pt. reports havinga slight headache.    Pertinent History  Patient reports she went in for surgery on August 13th to have part of her colon removed which was infected.  when she woke up, she realized something was wrong, at first they thought it was the anesthesia.  She was having trouble with her right hand, missing her mouth when trying to take meds.  When she returned to have her staples out on august 27th she had a brain scan to confirm she had a stroke.    Currently in Pain?  Yes    Pain Score  3     Pain Location  Head      OT TREATMENT     Neuro muscular re-education:  Pt. worked on [facility] grasping 1/2" small magnetic pegs, disconnecting them, working on translatory movement moving them through her hand, and placing them on a flat tabletop surface. Pt. performed [facility] tasks using the Grooved pegboard. Pt. worked on grasping the grooved pegs from a horizontal position, and moving the pegs to a vertical position in the hand to prepare for placing them in the grooved slot. Pt. Removed them using thumb opposition to the  tip of her 2nd through 5th digit.  Therapeutic Exercise:  Pt. performed hand strengthening with green theraputty. Pt. required cues for proper technique. Pt. worked on gross grip, lateral pinch, 3pt. pinch, gross digit extension, digit extension, thumb opposition. Pt. required verbal and tactile cues for proper technique. Pt. was provided with a HEP for theraputty ex. through [place]. Pt. Required cues for the proper technique.                           OT Education - 11/18/18 1717    Education Details  RUE [facility]    Person(s) Educated  Patient    Methods  Explanation    Comprehension  Verbalized understanding          OT Long  Term Goals - 11/04/18 1523      OT LONG TERM GOAL #1   Title  Patient will complete hair care with modified independence.     Baseline  11/04/2018 - Pt no longer has difficulty with reaching during hair care, however requires more time and her right hand and UE fatigue quickly when opening/closing flat iron and keeping arm sustained in elevation    Time  8    Period  Weeks    Status  Achieved      OT LONG TERM GOAL #2   Title  Patient will complete shower transfers with modified independence     Baseline  10/05/2018 - Pt no longer requires supervision. Pt does not shower when home alone as a safety precaution, however does not require and assistance nor supervision in the bathroom.    Time  8    Period  Weeks    Status  Achieved      OT LONG TERM GOAL #3   Title  Patient will complete light meal preparation with modified independence     Baseline  11/04/2018 - Pt requires supervision during light meal prep as she has previous    Time  12    Period  Weeks    Status  Achieved    Target Date  --      OT LONG TERM GOAL #4   Title  Patient will complete light housekeeping tasks with modified independence     Baseline  10/05/2018 - Pt. reports being able to complete light housekeeping tasks.    Time  12    Period  Weeks    Status  Achieved      OT LONG TERM GOAL #5   Title  Patient will be able to problem solve counting change, and performing basic financial interactions with 100% accuracy.    Baseline  11/04/2018 - Pt can complete money management, bill pay, and balancing her account online with 100% accuracy, however demonstrates decreased accuracy when not using computer.    Time  12    Period  Weeks    Status  Revised    Target Date  01/27/19      Long Term Additional Goals   Additional Long Term Goals  Yes      OT LONG TERM GOAL #6   Title  Patient will tolerate 30 mins of tasks on the computer working towards work tasks without complaints of pain (headache)    Baseline   11/04/2018:  Pt. is able to tolerate limited computer time for basic computer tasks/Bill Pay. Headaches limit her ability to tolerate any additional computer use.     Time  12  Period  Weeks    Status  Not Met    Target Date  11/04/18      OT LONG TERM GOAL #7   Title  Pt. will increase bilateral grip strength by 5# to be able to wring out a towel.    Baseline  11/04/2018: Bilateral grip strength is weaker.Pt. is unable use her hands to wring out a towel.    Time  12    Period  Weeks    Status  New    Target Date  01/27/19      OT LONG TERM GOAL #8   Title  Pt. ill increase bilateral lateral pinch strength by 2# to be able to open medication bottles, and jars.    Baseline  11/04/2018: Pt. is unable to open medication bottles, and jars.    Time  12    Period  Weeks    Status  New    Target Date  01/27/19      OT LONG TERM GOAL  #9   Baseline  Pt. will be independent with cutting meat    Time  12    Period  Weeks    Status  New    Target Date  01/27/19      OT LONG TERM GOAL  #10   TITLE  Pt. will improve bilateral [facility] skills to be able to manipulate buttons    Baseline  11/04/2018: Pt. has difficulty manipulating buttons    Time  12    Period  Weeks    Status  New    Target Date  01/27/19      OT LONG TERM GOAL  #11   TITLE  Pt. will be independently, and efficiently write 3 sentences with 100% legibility.    Baseline  11/04/2018: Pt. has difficulty with writing    Time  12    Period  Weeks    Status  New    Target Date  01/27/19            Plan - 11/18/18 1718    Clinical Impression Statement  Pt. reports she was in bed by 9 on New Years Eve. Pt. required cues for theraputty exercises, and proper form. Pt. continues to work on improving RUE strength, and [facility] skills making it difficult to manipulate buttons, and open containers. Pt. continues to work on improving RUE strength, and [place] in preparation for improving engagement in ADLs, and IADL tasks.      Occupational Profile and client history currently impacting functional performance  current smoker    Occupational performance deficits (Please refer to evaluation for details):  ADL's;IADL's;Work;Social Participation    Rehab Potential  Good    Current Impairments/barriers affecting progress:  headaches with electronic devices, works full time on the computer with productivity demands, right visual deficits, impaired cognitive processing, problem solving    OT Frequency  2x / week    OT Duration  12 weeks    OT Treatment/Interventions  Self-care/ADL training;Therapeutic exercise;Visual/perceptual remediation/compensation;Moist Heat;Neuromuscular education;Patient/family education;Therapeutic activities;Manual Therapy;Cognitive remediation/compensation;DME and/or AE instruction    Clinical Decision Making  Limited treatment options, no task modification necessary    Consulted and Agree with Plan of Care  Patient       Patient will benefit from skilled therapeutic intervention in order to improve the following deficits and impairments:  Decreased cognition, Impaired vision/preception, Decreased coordination, Decreased activity tolerance, Decreased strength, Impaired UE functional use  Visit Diagnosis: Muscle weakness (generalized)  Other lack of coordination  Problem List Patient Active Problem List   Diagnosis Date Noted  . Diverticulitis of colon 06/21/2018  . Sigmoid diverticulitis   . Coronary artery disease 03/09/2018  . Diabetes ([place]) 03/09/2018  . Anxiety, generalized 10/20/2016  . H/O carotid endarterectomy 10/20/2016  . Hyperlipidemia, mixed 10/20/2016  . Pruritic erythematous rash 10/20/2016  . Dependence on nicotine from other tobacco product 07/11/2016  . Tobacco abuse 07/11/2016  . Family history of colon cancer 12/07/2015  . Family history of ovarian cancer 12/07/2015  . Prediabetes 12/04/2015  . Hx of endarterectomy 11/02/2015  . Essential hypertension 11/02/2015   . Hypertriglyceridemia 09/03/2015  . Adiposity 09/03/2015  . BMI 29.0-29.9,adult 07/17/2015  . Anxiety 07/17/2015  . GERD (gastroesophageal reflux disease) 07/17/2015  . Hot flashes, menopausal 07/17/2015    Harrel Carina, MS, OTR/L 11/18/2018, 5:27 PM  [place] MAIN [facility] SERVICES [address] [place], [place], [address] Phone: [phone]   Fax:  [phone]  Name: Courtney King MRN: [id number] Date of Birth: 1966/07/18

## 2018-11-19 NOTE — Therapy (Signed)
[place] MAIN [facility] SERVICES [address] [place], [place], [address] Phone: [phone]   Fax:  [phone]  Speech Language Pathology Treatment  Patient Details  Name: Courtney King MRN: [id number] Date of Birth: 02-03-1966 Referring Provider (SLP): [place], [place] Ander Purpura    Encounter Date: 11/18/2018  End of Session - 11/19/18 1420    Visit Number  17    Number of Visits  28    Date for SLP Re-Evaluation  12/03/18    SLP Start Time  1600    SLP Stop Time   1645    SLP Time Calculation (min)  45 min    Activity Tolerance  Patient tolerated treatment well       Past Medical History:  Diagnosis Date  . Allergy   . Anxiety   . Diabetes mellitus without complication ([place])   . GERD (gastroesophageal reflux disease)   . H/O blood clots 2014   in abdominal aorta - endarterectomy at [facility]  . Hyperlipidemia   . Hypertension   . Left leg claudication [facility])     Past Surgical History:  Procedure Laterality Date  . ABDOMINAL HYSTERECTOMY  2004  . AORTIC ENDARTERECETOMY  2013  . CHOLECYSTECTOMY  1999  . COLONOSCOPY WITH PROPOFOL N/A 04/03/2017   Procedure: COLONOSCOPY WITH PROPOFOL;  Surgeon: Jonathon Bellows, MD;  Location: [facility] ENDOSCOPY;  Service: Endoscopy;  Laterality: N/A;  . ESOPHAGOGASTRODUODENOSCOPY (EGD) WITH PROPOFOL N/A 04/03/2017   Procedure: ESOPHAGOGASTRODUODENOSCOPY (EGD) WITH PROPOFOL;  Surgeon: Jonathon Bellows, MD;  Location: [facility] ENDOSCOPY;  Service: Endoscopy;  Laterality: N/A;  . EUS N/A 04/30/2017   Procedure: FULL UPPER ENDOSCOPIC ULTRASOUND (EUS) RADIAL;  Surgeon: Jola Schmidt, MD;  Location: [facility] ENDOSCOPY;  Service: Endoscopy;  Laterality: N/A;  . FOOT SURGERY Left    plantar fasciitis  . LAPAROSCOPIC NISSEN FUNDOPLICATION  9371   done in [place] to eliminate acid reflux  . LAPAROSCOPIC SIGMOID COLECTOMY N/A 06/29/2018   Procedure: LAPAROSCOPIC SIGMOID COLECTOMY;  Surgeon: Jules Husbands, MD;  Location: [facility] ORS;  Service:  General;  Laterality: N/A;  . OOPHORECTOMY Bilateral 2004    There were no vitals filed for this visit.  Subjective Assessment - 11/19/18 1419    Subjective  "When ever I have to focus I get a headache."            ADULT SLP TREATMENT - 11/19/18 0001      General Information   Behavior/Cognition  Alert;Cooperative;Pleasant mood    HPI  Courtney King is a 53 y.o. female: presents for f/u stroke. 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 Deficits began after surgery on 06/29/2018 but were thought to be 2/2 anesthesia. Pt has followed with neurology. Has appt with ophthamology for visual field deficits this week. Awaiting OT/ Speech eval for aphasia- slow and difficult speech since 8/13.       Treatment Provided   Treatment provided  Cognitive-Linquistic      Pain Assessment   Pain Assessment  0-10    Pain Score  3     Pain Location  Head      Cognitive-Linquistic Treatment   Treatment focused on  Apraxia;Aphasia    Skilled Treatment  VERBAL EXPRESSION: Maintain conversation with 80-90% fluency. Patient demonstrated minimal overt word finding errors in conversation.  Patient completed a variety of reading and linguistic (identify commonalities/differences, identify functionally related words, identify semantically related words) tasks with overall 85% accuracy.      Assessment / Recommendations /  Plan   Plan  Continue with current plan of care      Progression Toward Goals   Progression toward goals  Progressing toward goals       SLP Education - 11/19/18 1419    Education Details  remeber to take your time with speech and word finding    Person(s) Educated  Patient    Methods  Explanation    Comprehension  Verbalized understanding         SLP Long Term Goals - 09/30/18 1237      SLP LONG TERM GOAL #1   Title  Patient will generate grammatical, fluent, and cogent sentences to complete abstract/complex linguistic task with 80% accuracy.    Time  8     Period  Weeks    Status  Partially Met    Target Date  12/03/18      SLP LONG TERM GOAL #2   Title  Patient will complete high level word finding tasks with 80% accuracy.    Time  8    Period  Weeks    Status  Partially Met    Target Date  12/03/18      SLP LONG TERM GOAL #3   Title  Patient will write grammatical and cogent sentence to complete abstract/complex linguistic task with 80% accuracy.    Time  8    Period  Weeks    Status  Partially Met    Target Date  12/03/18      SLP LONG TERM GOAL #4   Title  Patient will demonstrate reading comprehension for paragraphs with 80% accuracy.     Time  8    Period  Weeks    Status  Partially Met    Target Date  12/03/18       Plan - 11/19/18 1420    Clinical Impression Statement  The patient continues to have headache when concentrating and when under stress.  Her fluency and word finding are much improved since beginning speech therapy but her ability to concentrate, work on the computer, and comprehend written information remain impaired due to headaches.  Will continue ST to address cognitive linguistic function and speech.      Speech Therapy Frequency  2x / week    Duration  Other (comment)    Treatment/Interventions  Language facilitation;SLP instruction and feedback;Compensatory techniques;Cognitive reorganization;Patient/family education    Potential to Achieve Goals  Good    Potential Considerations  Ability to learn/carryover information;Pain level;Family/community support;Co-morbidities;Previous level of function;Cooperation/participation level;Severity of impairments;Medical prognosis    SLP Home Exercise Plan  word finding work Financial trader and Agree with Plan of Care  Patient       Patient will benefit from skilled therapeutic intervention in order to improve the following deficits and impairments:   Cognitive communication deficit  Apraxia following nontraumatic intracerebral  hemorrhage  Aphasia    Problem List Patient Active Problem List   Diagnosis Date Noted  . Diverticulitis of colon 06/21/2018  . Sigmoid diverticulitis   . Coronary artery disease 03/09/2018  . Diabetes ([place]) 03/09/2018  . Anxiety, generalized 10/20/2016  . H/O carotid endarterectomy 10/20/2016  . Hyperlipidemia, mixed 10/20/2016  . Pruritic erythematous rash 10/20/2016  . Dependence on nicotine from other tobacco product 07/11/2016  . Tobacco abuse 07/11/2016  . Family history of colon cancer 12/07/2015  . Family history of ovarian cancer 12/07/2015  . Prediabetes 12/04/2015  . Hx of endarterectomy 11/02/2015  . Essential hypertension 11/02/2015  .  Hypertriglyceridemia 09/03/2015  . Adiposity 09/03/2015  . BMI 29.0-29.9,adult 07/17/2015  . Anxiety 07/17/2015  . GERD (gastroesophageal reflux disease) 07/17/2015  . Hot flashes, menopausal 07/17/2015   Leroy Sea, MS/CCC- SLP  Lou Miner 11/19/2018, 2:21 PM  [place] MAIN [facility] SERVICES [address] [place], [place], [address] Phone: [phone]   Fax:  [phone]   Name: SHARLIE SHREFFLER MRN: [id number] Date of Birth: 19-Dec-1965

## 2018-11-23 ENCOUNTER — Ambulatory Visit: Payer: Managed Care, Other (non HMO) | Admitting: Occupational Therapy

## 2018-11-23 ENCOUNTER — Ambulatory Visit: Payer: Managed Care, Other (non HMO) | Admitting: Speech Pathology

## 2018-11-23 DIAGNOSIS — M6281 Muscle weakness (generalized): Secondary | ICD-10-CM | POA: Diagnosis not present

## 2018-11-23 DIAGNOSIS — R41841 Cognitive communication deficit: Secondary | ICD-10-CM

## 2018-11-23 DIAGNOSIS — I69190 Apraxia following nontraumatic intracerebral hemorrhage: Secondary | ICD-10-CM

## 2018-11-24 ENCOUNTER — Encounter: Payer: Self-pay | Admitting: Speech Pathology

## 2018-11-24 NOTE — Therapy (Signed)
[place] MAIN [facility] SERVICES [address] [place], [place], [address] Phone: [phone]   Fax:  [phone]  Speech Language Pathology Treatment  Patient Details  Name: [name] MRN: [id number] Date of Birth: May 17, 1966 Referring Provider (SLP): [place], [place] Ander Purpura    Encounter Date: 11/23/2018  End of Session - 11/24/18 1430    Visit Number  18    Number of Visits  28    Date for SLP Re-Evaluation  12/03/18    SLP Start Time  1600    SLP Stop Time   1655    SLP Time Calculation (min)  55 min    Activity Tolerance  Patient tolerated treatment well       Past Medical History:  Diagnosis Date  . Allergy   . Anxiety   . Diabetes mellitus without complication ([place])   . GERD (gastroesophageal reflux disease)   . H/O blood clots 2014   in abdominal aorta - endarterectomy at [facility]  . Hyperlipidemia   . Hypertension   . Left leg claudication [facility])     Past Surgical History:  Procedure Laterality Date  . ABDOMINAL HYSTERECTOMY  2004  . AORTIC ENDARTERECETOMY  2013  . CHOLECYSTECTOMY  1999  . COLONOSCOPY WITH PROPOFOL N/A 04/03/2017   Procedure: COLONOSCOPY WITH PROPOFOL;  Surgeon: Jonathon Bellows, MD;  Location: [facility] ENDOSCOPY;  Service: Endoscopy;  Laterality: N/A;  . ESOPHAGOGASTRODUODENOSCOPY (EGD) WITH PROPOFOL N/A 04/03/2017   Procedure: ESOPHAGOGASTRODUODENOSCOPY (EGD) WITH PROPOFOL;  Surgeon: Jonathon Bellows, MD;  Location: [facility] ENDOSCOPY;  Service: Endoscopy;  Laterality: N/A;  . EUS N/A 04/30/2017   Procedure: FULL UPPER ENDOSCOPIC ULTRASOUND (EUS) RADIAL;  Surgeon: Jola Schmidt, MD;  Location: [facility] ENDOSCOPY;  Service: Endoscopy;  Laterality: N/A;  . FOOT SURGERY Left    plantar fasciitis  . LAPAROSCOPIC NISSEN FUNDOPLICATION  0539   done in [place] to eliminate acid reflux  . LAPAROSCOPIC SIGMOID COLECTOMY N/A 06/29/2018   Procedure: LAPAROSCOPIC SIGMOID COLECTOMY;  Surgeon: Jules Husbands, MD;  Location: [facility] ORS;  Service:  General;  Laterality: N/A;  . OOPHORECTOMY Bilateral 2004    There were no vitals filed for this visit.  Subjective Assessment - 11/24/18 1429    Subjective  "Sometimes iI just don't undrstand what I hear"            ADULT SLP TREATMENT - 11/24/18 0001      General Information   Behavior/Cognition  Alert;Cooperative;Pleasant mood    HPI  Courtney King is a 53 y.o. female: presents for f/u stroke. 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 Deficits began after surgery on 06/29/2018 but were thought to be 2/2 anesthesia. Pt has followed with neurology. Has appt with ophthamology for visual field deficits this week. Awaiting OT/ Speech eval for aphasia- slow and difficult speech since 8/13.       Pain Assessment   Pain Location  Head      Cognitive-Linquistic Treatment   Treatment focused on  Apraxia;Aphasia    Skilled Treatment  VERBAL EXPRESSION: Maintain emotion-laden conversation with 80-90% fluency. Patient demonstrated minimal overt word finding errors in this conversation and was able to make cogent arguments.  Patient completed a variety of reading and linguistic (identify commonalities/differences, identify functionally related words, identify semantically related words) tasks with overall 85% accuracy.  READING:  read and complete Perplexor Level A logic problems given overall mod cues.      Assessment / Recommendations / Plan   Plan  Continue with current plan of care      Progression Toward Goals   Progression toward goals  Progressing toward goals       SLP Education - 11/24/18 1430    Education Details  remember to take your time with speech and word finding    Person(s) Educated  Patient    Methods  Explanation    Comprehension  Verbalized understanding         SLP Long Term Goals - 09/30/18 1237      SLP LONG TERM GOAL #1   Title  Patient will generate grammatical, fluent, and cogent sentences to complete abstract/complex linguistic task  with 80% accuracy.    Time  8    Period  Weeks    Status  Partially Met    Target Date  12/03/18      SLP LONG TERM GOAL #2   Title  Patient will complete high level word finding tasks with 80% accuracy.    Time  8    Period  Weeks    Status  Partially Met    Target Date  12/03/18      SLP LONG TERM GOAL #3   Title  Patient will write grammatical and cogent sentence to complete abstract/complex linguistic task with 80% accuracy.    Time  8    Period  Weeks    Status  Partially Met    Target Date  12/03/18      SLP LONG TERM GOAL #4   Title  Patient will demonstrate reading comprehension for paragraphs with 80% accuracy.     Time  8    Period  Weeks    Status  Partially Met    Target Date  12/03/18       Plan - 11/24/18 1431    Clinical Impression Statement  The patient continues to have headache when concentrating and when under stress.  Her fluency and word finding are much improved since beginning speech therapy but her ability to concentrate, work on the computer, and comprehend written information remain impaired due to headaches.  Will continue ST to address cognitive linguistic function and speech.      Speech Therapy Frequency  2x / week    Duration  Other (comment)    Treatment/Interventions  Language facilitation;SLP instruction and feedback;Compensatory techniques;Cognitive reorganization;Patient/family education    Potential to Achieve Goals  Good    Potential Considerations  Ability to learn/carryover information;Pain level;Family/community support;Co-morbidities;Previous level of function;Cooperation/participation level;Severity of impairments;Medical prognosis    Consulted and Agree with Plan of Care  Patient       Patient will benefit from skilled therapeutic intervention in order to improve the following deficits and impairments:   Cognitive communication deficit  Apraxia following nontraumatic intracerebral hemorrhage    Problem List Patient Active  Problem List   Diagnosis Date Noted  . Diverticulitis of colon 06/21/2018  . Sigmoid diverticulitis   . Coronary artery disease 03/09/2018  . Diabetes ([place]) 03/09/2018  . Anxiety, generalized 10/20/2016  . H/O carotid endarterectomy 10/20/2016  . Hyperlipidemia, mixed 10/20/2016  . Pruritic erythematous rash 10/20/2016  . Dependence on nicotine from other tobacco product 07/11/2016  . Tobacco abuse 07/11/2016  . Family history of colon cancer 12/07/2015  . Family history of ovarian cancer 12/07/2015  . Prediabetes 12/04/2015  . Hx of endarterectomy 11/02/2015  . Essential hypertension 11/02/2015  . Hypertriglyceridemia 09/03/2015  . Adiposity 09/03/2015  . BMI 29.0-29.9,adult 07/17/2015  . Anxiety 07/17/2015  . GERD (  gastroesophageal reflux disease) 07/17/2015  . Hot flashes, menopausal 07/17/2015   Leroy Sea, MS/CCC- SLP  Lou Miner 11/24/2018, 2:32 PM  [place] MAIN [facility] SERVICES [address] [place], [place], [address] Phone: [phone]   Fax:  [phone]   Name: LAURELLA TULL MRN: [id number] Date of Birth: 1966-05-07

## 2018-11-25 ENCOUNTER — Encounter: Payer: Self-pay | Admitting: Occupational Therapy

## 2018-11-25 ENCOUNTER — Ambulatory Visit: Payer: Managed Care, Other (non HMO) | Admitting: Occupational Therapy

## 2018-11-25 ENCOUNTER — Ambulatory Visit: Payer: Managed Care, Other (non HMO) | Admitting: Speech Pathology

## 2018-11-25 DIAGNOSIS — M6281 Muscle weakness (generalized): Secondary | ICD-10-CM

## 2018-11-25 DIAGNOSIS — I69190 Apraxia following nontraumatic intracerebral hemorrhage: Secondary | ICD-10-CM

## 2018-11-25 DIAGNOSIS — R41841 Cognitive communication deficit: Secondary | ICD-10-CM

## 2018-11-25 DIAGNOSIS — R278 Other lack of coordination: Secondary | ICD-10-CM

## 2018-11-25 NOTE — Therapy (Signed)
[place] MAIN [facility] SERVICES [address] [place], [place], [address] Phone: [phone]   Fax:  [phone]  Occupational Therapy Treatment  Patient Details  Name: Courtney King MRN: [id number] Date of Birth: [birth year] No data recorded  Encounter Date: 11/25/2018  OT End of Session - 11/25/18 2242    Visit Number  28    Number of Visits  28    Date for OT Re-Evaluation  01/27/19    Authorization Type  [employer]    OT Start Time  1515    OT Stop Time  1600    OT Time Calculation (min)  45 min    Activity Tolerance  Patient tolerated treatment well    Behavior During Therapy  [facility] for tasks assessed/performed       Past Medical History:  Diagnosis Date  . Allergy   . Anxiety   . Diabetes mellitus without complication ([place])   . GERD (gastroesophageal reflux disease)   . H/O blood clots 2014   in abdominal aorta - endarterectomy at [facility]  . Hyperlipidemia   . Hypertension   . Left leg claudication [facility])     Past Surgical History:  Procedure Laterality Date  . ABDOMINAL HYSTERECTOMY  2004  . AORTIC ENDARTERECETOMY  2013  . CHOLECYSTECTOMY  1999  . COLONOSCOPY WITH PROPOFOL N/A 04/03/2017   Procedure: COLONOSCOPY WITH PROPOFOL;  Surgeon: Jonathon Bellows, MD;  Location: [facility] ENDOSCOPY;  Service: Endoscopy;  Laterality: N/A;  . ESOPHAGOGASTRODUODENOSCOPY (EGD) WITH PROPOFOL N/A 04/03/2017   Procedure: ESOPHAGOGASTRODUODENOSCOPY (EGD) WITH PROPOFOL;  Surgeon: Jonathon Bellows, MD;  Location: [facility] ENDOSCOPY;  Service: Endoscopy;  Laterality: N/A;  . EUS N/A 04/30/2017   Procedure: FULL UPPER ENDOSCOPIC ULTRASOUND (EUS) RADIAL;  Surgeon: Jola Schmidt, MD;  Location: [facility] ENDOSCOPY;  Service: Endoscopy;  Laterality: N/A;  . FOOT SURGERY Left    plantar fasciitis  . LAPAROSCOPIC NISSEN FUNDOPLICATION  3545   done in [place] to eliminate acid reflux  . LAPAROSCOPIC SIGMOID COLECTOMY N/A 06/29/2018   Procedure: LAPAROSCOPIC SIGMOID COLECTOMY;   Surgeon: Jules Husbands, MD;  Location: [facility] ORS;  Service: General;  Laterality: N/A;  . OOPHORECTOMY Bilateral 2004    There were no vitals filed for this visit.  Subjective Assessment - 11/25/18 2240    Subjective   Pt. reports she missed the last session because she got her apppointment times mixed up.    Pertinent History  Patient reports she went in for surgery on August 13th to have part of her colon removed which was infected.  when she woke up, she realized something was wrong, at first they thought it was the anesthesia.  She was having trouble with her right hand, missing her mouth when trying to take meds.  When she returned to have her staples out on august 27th she had a brain scan to confirm she had a stroke.    Patient Stated Goals  "I want to be able to go back to work" , be independent     Currently in Pain?  No/denies      OT TREATMENT    Neuro muscular re-education:  Pt. worked on grasping 1" resistive cubes alternating thumb opposition to the tip of the 2nd through 5th digits while the board is placed at a vertical angle. Pt. worked on pressing the cubes back into place while alternating isolated 2nd through 5th digit extension. Pt. worked on tasks to sustain lateral pinch on resistive tweezers while grasping and moving  2" toothpick sticks from a horizontal flat position to a vertical position in order to place it in the holder. Pt, was unable to finish this task secondary to fatigue. Pt. was able to sustain grasp while positioning and extending the wrist/hand in the necessary alignment needed to place the stick through the top of the holder.Pt. performed [facility] tasks using the Grooved pegboard. Pt. worked on grasping the grooved pegs from a horizontal position, and moving the pegs to a vertical position in the hand to prepare for placing them in the grooved slot. Pt. required cuing for proper technique. Pt. Worked on thee components of movement in order to be able to manipulate  buttons,a nd per writing tasks legibly, and efficiently.  Therapeutic Exercise:  Pt. performed gross gripping with grip strengthener. Pt. worked on sustaining grip while grasping pegs and reaching at various heights. The gripper was placed in the 3rd resistive slot with the white resistive spring. The gripper was the moved to the 2nd, and then ultimately to the 1st resistive slot. Pt. Worked on pinch strengthening in the right  hand for lateral, and 3pt. pinch using yellow, red, green, and blue resistive clips. Pt. worked on placing the clips at various vertical and horizontal angles. Tactile and verbal cues were required for eliciting the desired movement. Grip, and pinch strength were performed in order to worked towards indepndence with opening containers, bottles, and securing a grasp onto items needed during ADLs, and IADLs.                        OT Education - 11/25/18 2241    Education Details  RUE sterngthening, and [facility]    Person(s) Educated  Patient    Methods  Explanation    Comprehension  Verbalized understanding          OT Long Term Goals - 11/04/18 1523      OT LONG TERM GOAL #1   Title  Patient will complete hair care with modified independence.     Baseline  11/04/2018 - Pt no longer has difficulty with reaching during hair care, however requires more time and her right hand and UE fatigue quickly when opening/closing flat iron and keeping arm sustained in elevation    Time  8    Period  Weeks    Status  Achieved      OT LONG TERM GOAL #2   Title  Patient will complete shower transfers with modified independence     Baseline  10/05/2018 - Pt no longer requires supervision. Pt does not shower when home alone as a safety precaution, however does not require and assistance nor supervision in the bathroom.    Time  8    Period  Weeks    Status  Achieved      OT LONG TERM GOAL #3   Title  Patient will complete light meal preparation with modified  independence     Baseline  11/04/2018 - Pt requires supervision during light meal prep as she has previous    Time  12    Period  Weeks    Status  Achieved    Target Date  --      OT LONG TERM GOAL #4   Title  Patient will complete light housekeeping tasks with modified independence     Baseline  10/05/2018 - Pt. reports being able to complete light housekeeping tasks.    Time  12    Period  Weeks  Status  Achieved      OT LONG TERM GOAL #5   Title  Patient will be able to problem solve counting change, and performing basic financial interactions with 100% accuracy.    Baseline  11/04/2018 - Pt can complete money management, bill pay, and balancing her account online with 100% accuracy, however demonstrates decreased accuracy when not using computer.    Time  12    Period  Weeks    Status  Revised    Target Date  01/27/19      Long Term Additional Goals   Additional Long Term Goals  Yes      OT LONG TERM GOAL #6   Title  Patient will tolerate 30 mins of tasks on the computer working towards work tasks without complaints of pain (headache)    Baseline  11/04/2018:  Pt. is able to tolerate limited computer time for basic computer tasks/Bill Pay. Headaches limit her ability to tolerate any additional computer use.     Time  12    Period  Weeks    Status  Not Met    Target Date  11/04/18      OT LONG TERM GOAL #7   Title  Pt. will increase bilateral grip strength by 5# to be able to wring out a towel.    Baseline  11/04/2018: Bilateral grip strength is weaker.Pt. is unable use her hands to wring out a towel.    Time  12    Period  Weeks    Status  New    Target Date  01/27/19      OT LONG TERM GOAL #8   Title  Pt. ill increase bilateral lateral pinch strength by 2# to be able to open medication bottles, and jars.    Baseline  11/04/2018: Pt. is unable to open medication bottles, and jars.    Time  12    Period  Weeks    Status  New    Target Date  01/27/19      OT LONG  TERM GOAL  #9   Baseline  Pt. will be independent with cutting meat    Time  12    Period  Weeks    Status  New    Target Date  01/27/19      OT LONG TERM GOAL  #10   TITLE  Pt. will improve bilateral [facility] skills to be able to manipulate buttons    Baseline  11/04/2018: Pt. has difficulty manipulating buttons    Time  12    Period  Weeks    Status  New    Target Date  01/27/19      OT LONG TERM GOAL  #11   TITLE  Pt. will be independently, and efficiently write 3 sentences with 100% legibility.    Baseline  11/04/2018: Pt. has difficulty with writing    Time  12    Period  Weeks    Status  New    Target Date  01/27/19            Plan - 11/25/18 2242    Clinical Impression Statement  Pt. continues to present with limited RUE strength, and [facility] skills. Pt. continues to work on improving ADL, and IADL functioning in order to be able to open jars, button buttons, open medication bottles, and write legibly.    Occupational Profile and client history currently impacting functional performance  current smoker    Occupational performance deficits (Please refer  to evaluation for details):  ADL's;IADL's;Work;Social Participation    Rehab Potential  Good    Current Impairments/barriers affecting progress:  headaches with electronic devices, works full time on the computer with productivity demands, right visual deficits, impaired cognitive processing, problem solving    OT Frequency  2x / week    OT Duration  12 weeks    OT Treatment/Interventions  Self-care/ADL training;Therapeutic exercise;Visual/perceptual remediation/compensation;Moist Heat;Neuromuscular education;Patient/family education;Therapeutic activities;Manual Therapy;Cognitive remediation/compensation;DME and/or AE instruction    Clinical Decision Making  Limited treatment options, no task modification necessary    Consulted and Agree with Plan of Care  Patient       Patient will benefit from skilled therapeutic  intervention in order to improve the following deficits and impairments:  Decreased cognition, Impaired vision/preception, Decreased coordination, Decreased activity tolerance, Decreased strength, Impaired UE functional use  Visit Diagnosis: Muscle weakness (generalized)  Other lack of coordination    Problem List Patient Active Problem List   Diagnosis Date Noted  . Diverticulitis of colon 06/21/2018  . Sigmoid diverticulitis   . Coronary artery disease 03/09/2018  . Diabetes ([place]) 03/09/2018  . Anxiety, generalized 10/20/2016  . H/O carotid endarterectomy 10/20/2016  . Hyperlipidemia, mixed 10/20/2016  . Pruritic erythematous rash 10/20/2016  . Dependence on nicotine from other tobacco product 07/11/2016  . Tobacco abuse 07/11/2016  . Family history of colon cancer 12/07/2015  . Family history of ovarian cancer 12/07/2015  . Prediabetes 12/04/2015  . Hx of endarterectomy 11/02/2015  . Essential hypertension 11/02/2015  . Hypertriglyceridemia 09/03/2015  . Adiposity 09/03/2015  . BMI 29.0-29.9,adult 07/17/2015  . Anxiety 07/17/2015  . GERD (gastroesophageal reflux disease) 07/17/2015  . Hot flashes, menopausal 07/17/2015    Harrel Carina, MS, OTR/L 11/25/2018, 10:49 PM  [place] MAIN [facility] SERVICES [address] [place], [place], [address] Phone: [phone]   Fax:  [phone]  Name: AFRAH BURLISON MRN: [id number] Date of Birth: [birth year]

## 2018-11-26 ENCOUNTER — Encounter: Payer: Self-pay | Admitting: Speech Pathology

## 2018-11-26 NOTE — Therapy (Signed)
[place] MAIN [facility] SERVICES [address] [place], [place], [address] Phone: [phone]   Fax:  [phone]  Speech Language Pathology Treatment  Patient Details  Name: Courtney King MRN: [id number] Date of Birth: 06-11-1966 Referring Provider (SLP): [place], [place] Ander Purpura    Encounter Date: 11/25/2018  End of Session - 11/26/18 1529    Visit Number  19    Number of Visits  28    Date for SLP Re-Evaluation  12/03/18    SLP Start Time  1600    SLP Stop Time   3403    SLP Time Calculation (min)  45 min    Activity Tolerance  Patient tolerated treatment well;Other (comment)   Increased headache      Past Medical History:  Diagnosis Date  . Allergy   . Anxiety   . Diabetes mellitus without complication ([place])   . GERD (gastroesophageal reflux disease)   . H/O blood clots 2014   in abdominal aorta - endarterectomy at [facility]  . Hyperlipidemia   . Hypertension   . Left leg claudication [facility])     Past Surgical History:  Procedure Laterality Date  . ABDOMINAL HYSTERECTOMY  2004  . AORTIC ENDARTERECETOMY  2013  . CHOLECYSTECTOMY  1999  . COLONOSCOPY WITH PROPOFOL N/A 04/03/2017   Procedure: COLONOSCOPY WITH PROPOFOL;  Surgeon: Jonathon Bellows, MD;  Location: [facility] ENDOSCOPY;  Service: Endoscopy;  Laterality: N/A;  . ESOPHAGOGASTRODUODENOSCOPY (EGD) WITH PROPOFOL N/A 04/03/2017   Procedure: ESOPHAGOGASTRODUODENOSCOPY (EGD) WITH PROPOFOL;  Surgeon: Jonathon Bellows, MD;  Location: [facility] ENDOSCOPY;  Service: Endoscopy;  Laterality: N/A;  . EUS N/A 04/30/2017   Procedure: FULL UPPER ENDOSCOPIC ULTRASOUND (EUS) RADIAL;  Surgeon: Jola Schmidt, MD;  Location: [facility] ENDOSCOPY;  Service: Endoscopy;  Laterality: N/A;  . FOOT SURGERY Left    plantar fasciitis  . LAPAROSCOPIC NISSEN FUNDOPLICATION  7096   done in [place] to eliminate acid reflux  . LAPAROSCOPIC SIGMOID COLECTOMY N/A 06/29/2018   Procedure: LAPAROSCOPIC SIGMOID COLECTOMY;  Surgeon: Jules Husbands,  MD;  Location: [facility] ORS;  Service: General;  Laterality: N/A;  . OOPHORECTOMY Bilateral 2004    There were no vitals filed for this visit.  Subjective Assessment - 11/26/18 1526    Subjective  "Sometimes iI just don't undrstand what I hear"            ADULT SLP TREATMENT - 11/26/18 0001      General Information   Behavior/Cognition  Alert;Cooperative;Pleasant mood    HPI  Courtney King is a 53 y.o. female: presents for f/u stroke. 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 Deficits began after surgery on 06/29/2018 but were thought to be 2/2 anesthesia. Pt has followed with neurology. Has appt with ophthamology for visual field deficits this week. Awaiting OT/ Speech eval for aphasia- slow and difficult speech since 8/13.       Treatment Provided   Treatment provided  Cognitive-Linquistic      Pain Assessment   Pain Assessment  0-10    Pain Score  5     Pain Location  Head      Cognitive-Linquistic Treatment   Treatment focused on  Apraxia;Aphasia    Skilled Treatment  READING: Complete information processing tasks (problem solving, text comprehension) with overall 70% accuracy.  Errors due to superficial consideration of written information- which may be due to increasing headache with increased concentration/focus. AUDITORY COMPREHENSION/ATTENTION: Listen to lengthy/verbose message and extract important details with 65% accuracy.  VISUAL ATTENTION:  Find 14/15 differences between 2 pictures.      Assessment / Recommendations / Plan   Plan  Continue with current plan of care      Progression Toward Goals   Progression toward goals  Progressing toward goals       SLP Education - 11/26/18 1529    Education Details  remember to take your time with speech and word finding    Person(s) Educated  Patient    Methods  Explanation    Comprehension  Verbalized understanding         SLP Long Term Goals - 09/30/18 1237      SLP LONG TERM GOAL #1   Title   Patient will generate grammatical, fluent, and cogent sentences to complete abstract/complex linguistic task with 80% accuracy.    Time  8    Period  Weeks    Status  Partially Met    Target Date  12/03/18      SLP LONG TERM GOAL #2   Title  Patient will complete high level word finding tasks with 80% accuracy.    Time  8    Period  Weeks    Status  Partially Met    Target Date  12/03/18      SLP LONG TERM GOAL #3   Title  Patient will write grammatical and cogent sentence to complete abstract/complex linguistic task with 80% accuracy.    Time  8    Period  Weeks    Status  Partially Met    Target Date  12/03/18      SLP LONG TERM GOAL #4   Title  Patient will demonstrate reading comprehension for paragraphs with 80% accuracy.     Time  8    Period  Weeks    Status  Partially Met    Target Date  12/03/18       Plan - 11/26/18 1530    Clinical Impression Statement  The patient continues to have headache when concentrating and when under stress.  Her fluency and word finding are much improved since beginning speech therapy but her ability to concentrate, work on the computer, and comprehend written information remain impaired due to headaches.  Will continue ST to address cognitive linguistic function and speech.      Speech Therapy Frequency  2x / week    Duration  Other (comment)    Potential Considerations  Ability to learn/carryover information;Pain level;Family/community support;Co-morbidities;Previous level of function;Cooperation/participation level;Severity of impairments;Medical prognosis    Consulted and Agree with Plan of Care  Patient       Patient will benefit from skilled therapeutic intervention in order to improve the following deficits and impairments:   Cognitive communication deficit  Apraxia following nontraumatic intracerebral hemorrhage    Problem List Patient Active Problem List   Diagnosis Date Noted  . Diverticulitis of colon 06/21/2018  .  Sigmoid diverticulitis   . Coronary artery disease 03/09/2018  . Diabetes ([place]) 03/09/2018  . Anxiety, generalized 10/20/2016  . H/O carotid endarterectomy 10/20/2016  . Hyperlipidemia, mixed 10/20/2016  . Pruritic erythematous rash 10/20/2016  . Dependence on nicotine from other tobacco product 07/11/2016  . Tobacco abuse 07/11/2016  . Family history of colon cancer 12/07/2015  . Family history of ovarian cancer 12/07/2015  . Prediabetes 12/04/2015  . Hx of endarterectomy 11/02/2015  . Essential hypertension 11/02/2015  . Hypertriglyceridemia 09/03/2015  . Adiposity 09/03/2015  . BMI 29.0-29.9,adult 07/17/2015  . Anxiety 07/17/2015  .  GERD (gastroesophageal reflux disease) 07/17/2015  . Hot flashes, menopausal 07/17/2015   Leroy Sea, MS/CCC- SLP  Lou Miner 11/26/2018, 3:31 PM  [place] MAIN [facility] SERVICES [address] [place], [place], [address] Phone: [phone]   Fax:  [phone]   Name: Courtney King MRN: [id number] Date of Birth: 09/13/1966

## 2018-11-30 ENCOUNTER — Ambulatory Visit: Payer: Managed Care, Other (non HMO) | Admitting: Speech Pathology

## 2018-11-30 DIAGNOSIS — I69190 Apraxia following nontraumatic intracerebral hemorrhage: Secondary | ICD-10-CM

## 2018-11-30 DIAGNOSIS — M6281 Muscle weakness (generalized): Secondary | ICD-10-CM | POA: Diagnosis not present

## 2018-11-30 DIAGNOSIS — R41841 Cognitive communication deficit: Secondary | ICD-10-CM

## 2018-12-01 ENCOUNTER — Encounter: Payer: Self-pay | Admitting: Speech Pathology

## 2018-12-01 NOTE — Therapy (Signed)
[place] MAIN [facility] SERVICES [address] [place], [place], [address] Phone: [phone]   Fax:  [phone]  Speech Language Pathology Treatment  Patient Details  Name: Courtney King MRN: [id number] Date of Birth: [birth year] Referring Provider (SLP): [place], [place]    Encounter Date: 11/30/2018  End of Session - 12/01/18 0949    Visit Number  20    Number of Visits  28    Date for SLP Re-Evaluation  12/03/18    SLP Start Time  1600    SLP Stop Time   0321    SLP Time Calculation (min)  45 min    Activity Tolerance  Patient tolerated treatment well;Other (comment)   increased headache      Past Medical History:  Diagnosis Date  . Allergy   . Anxiety   . Diabetes mellitus without complication ([place])   . GERD (gastroesophageal reflux disease)   . H/O blood clots 2014   in abdominal aorta - endarterectomy at [facility]  . Hyperlipidemia   . Hypertension   . Left leg claudication [facility])     Past Surgical History:  Procedure Laterality Date  . ABDOMINAL HYSTERECTOMY  2004  . AORTIC ENDARTERECETOMY  2013  . CHOLECYSTECTOMY  1999  . COLONOSCOPY WITH PROPOFOL N/A 04/03/2017   Procedure: COLONOSCOPY WITH PROPOFOL;  Surgeon: Jonathon Bellows, MD;  Location: [facility] ENDOSCOPY;  Service: Endoscopy;  Laterality: N/A;  . ESOPHAGOGASTRODUODENOSCOPY (EGD) WITH PROPOFOL N/A 04/03/2017   Procedure: ESOPHAGOGASTRODUODENOSCOPY (EGD) WITH PROPOFOL;  Surgeon: Jonathon Bellows, MD;  Location: [facility] ENDOSCOPY;  Service: Endoscopy;  Laterality: N/A;  . EUS N/A 04/30/2017   Procedure: FULL UPPER ENDOSCOPIC ULTRASOUND (EUS) RADIAL;  Surgeon: Jola Schmidt, MD;  Location: [facility] ENDOSCOPY;  Service: Endoscopy;  Laterality: N/A;  . FOOT SURGERY Left    plantar fasciitis  . LAPAROSCOPIC NISSEN FUNDOPLICATION  2248   done in [place] to eliminate acid reflux  . LAPAROSCOPIC SIGMOID COLECTOMY N/A 06/29/2018   Procedure: LAPAROSCOPIC SIGMOID COLECTOMY;  Surgeon: Jules Husbands, MD;  Location: [facility] ORS;  Service: General;  Laterality: N/A;  . OOPHORECTOMY Bilateral 2004    There were no vitals filed for this visit.  Subjective Assessment - 12/01/18 0947    Subjective  Patient reports increased headache, dysfluency, and word frinding difficulty while trying to arrange disability and medical insurance details            ADULT SLP TREATMENT - 12/01/18 0001      General Information   Behavior/Cognition  Alert;Cooperative;Pleasant mood    HPI  Courtney King is a 53 y.o. female: presents for f/u stroke. 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 Deficits began after surgery on 06/29/2018 but were thought to be 2/2 anesthesia. Pt has followed with neurology. Has appt with ophthamology for visual field deficits this week. Awaiting OT/ Speech eval for aphasia- slow and difficult speech since 8/13.       Pain Assessment   Pain Location  Head      Cognitive-Linquistic Treatment   Treatment focused on  Apraxia;Aphasia    Skilled Treatment  VERBAL EXPRESSION: Maintain emotion-laden conversation with 80-90% fluency. Patient demonstrated minimal overt word finding errors in this conversation and was able to make cogent arguments.  Patient completed a variety of reading and linguistic (identify commonalities/differences, identify functionally related words, identify semantically related words) tasks with overall 85% accuracy.  READING:  Read and complete math word problems given SLP cues to multiple, reason  method to solve, and accurately read question to be answered.      Assessment / Recommendations / Plan   Plan  Continue with current plan of care      Progression Toward Goals   Progression toward goals  Progressing toward goals       SLP Education - 12/01/18 0949    Education Details  effect of stress on communication and concentration    Person(s) Educated  Patient    Methods  Explanation    Comprehension  Verbalized understanding          SLP Long Term Goals - 09/30/18 1237      SLP LONG TERM GOAL #1   Title  Patient will generate grammatical, fluent, and cogent sentences to complete abstract/complex linguistic task with 80% accuracy.    Time  8    Period  Weeks    Status  Partially Met    Target Date  12/03/18      SLP LONG TERM GOAL #2   Title  Patient will complete high level word finding tasks with 80% accuracy.    Time  8    Period  Weeks    Status  Partially Met    Target Date  12/03/18      SLP LONG TERM GOAL #3   Title  Patient will write grammatical and cogent sentence to complete abstract/complex linguistic task with 80% accuracy.    Time  8    Period  Weeks    Status  Partially Met    Target Date  12/03/18      SLP LONG TERM GOAL #4   Title  Patient will demonstrate reading comprehension for paragraphs with 80% accuracy.     Time  8    Period  Weeks    Status  Partially Met    Target Date  12/03/18       Plan - 12/01/18 0950    Clinical Impression Statement  The patient continues to have headache when concentrating and when under stress.  Her fluency and word finding are much improved since beginning speech therapy but her ability to concentrate, work on the computer, and comprehend written information remain impaired due to headaches.  Will continue ST to address cognitive linguistic function and speech.      Speech Therapy Frequency  2x / week    Duration  Other (comment)    Treatment/Interventions  Language facilitation;SLP instruction and feedback;Compensatory techniques;Cognitive reorganization;Patient/family education    Potential to Achieve Goals  Good    Potential Considerations  Ability to learn/carryover information;Pain level;Family/community support;Co-morbidities;Previous level of function;Cooperation/participation level;Severity of impairments;Medical prognosis    Consulted and Agree with Plan of Care  Patient       Patient will benefit from skilled therapeutic intervention  in order to improve the following deficits and impairments:   Cognitive communication deficit  Apraxia following nontraumatic intracerebral hemorrhage    Problem List Patient Active Problem List   Diagnosis Date Noted  . Diverticulitis of colon 06/21/2018  . Sigmoid diverticulitis   . Coronary artery disease 03/09/2018  . Diabetes ([place]) 03/09/2018  . Anxiety, generalized 10/20/2016  . H/O carotid endarterectomy 10/20/2016  . Hyperlipidemia, mixed 10/20/2016  . Pruritic erythematous rash 10/20/2016  . Dependence on nicotine from other tobacco product 07/11/2016  . Tobacco abuse 07/11/2016  . Family history of colon cancer 12/07/2015  . Family history of ovarian cancer 12/07/2015  . Prediabetes 12/04/2015  . Hx of endarterectomy 11/02/2015  . Essential hypertension  11/02/2015  . Hypertriglyceridemia 09/03/2015  . Adiposity 09/03/2015  . BMI 29.0-29.9,adult 07/17/2015  . Anxiety 07/17/2015  . GERD (gastroesophageal reflux disease) 07/17/2015  . Hot flashes, menopausal 07/17/2015   Leroy Sea, MS/CCC- SLP  Courtney King 12/01/2018, 9:50 AM  [place] MAIN [facility] SERVICES [address] [place], [place], [address] Phone: [phone]   Fax:  [phone]   Name: Courtney King MRN: [id number] Date of Birth: 1966/05/22

## 2018-12-02 ENCOUNTER — Ambulatory Visit: Payer: Managed Care, Other (non HMO) | Admitting: Speech Pathology

## 2018-12-02 ENCOUNTER — Ambulatory Visit: Payer: Managed Care, Other (non HMO) | Admitting: Occupational Therapy

## 2018-12-07 ENCOUNTER — Ambulatory Visit: Payer: Managed Care, Other (non HMO) | Admitting: Occupational Therapy

## 2018-12-07 ENCOUNTER — Ambulatory Visit: Payer: Managed Care, Other (non HMO) | Admitting: Speech Pathology

## 2018-12-09 ENCOUNTER — Ambulatory Visit: Payer: Managed Care, Other (non HMO) | Admitting: Occupational Therapy

## 2018-12-09 ENCOUNTER — Ambulatory Visit: Payer: Managed Care, Other (non HMO) | Admitting: Speech Pathology

## 2018-12-14 ENCOUNTER — Ambulatory Visit: Payer: Managed Care, Other (non HMO) | Admitting: Speech Pathology

## 2018-12-14 ENCOUNTER — Ambulatory Visit: Payer: Managed Care, Other (non HMO) | Admitting: Occupational Therapy

## 2018-12-16 ENCOUNTER — Encounter: Payer: Managed Care, Other (non HMO) | Admitting: Occupational Therapy

## 2018-12-16 ENCOUNTER — Encounter: Payer: Managed Care, Other (non HMO) | Admitting: Speech Pathology

## 2018-12-21 ENCOUNTER — Encounter: Payer: Managed Care, Other (non HMO) | Admitting: Occupational Therapy

## 2018-12-21 ENCOUNTER — Encounter: Payer: Managed Care, Other (non HMO) | Admitting: Speech Pathology

## 2018-12-23 ENCOUNTER — Encounter: Payer: Managed Care, Other (non HMO) | Admitting: Speech Pathology

## 2018-12-23 ENCOUNTER — Encounter: Payer: Managed Care, Other (non HMO) | Admitting: Occupational Therapy

## 2018-12-28 ENCOUNTER — Encounter: Payer: Managed Care, Other (non HMO) | Admitting: Speech Pathology

## 2018-12-28 ENCOUNTER — Encounter: Payer: Managed Care, Other (non HMO) | Admitting: Occupational Therapy

## 2018-12-30 ENCOUNTER — Encounter: Payer: Managed Care, Other (non HMO) | Admitting: Speech Pathology

## 2019-02-07 ENCOUNTER — Encounter: Payer: Self-pay | Admitting: Speech Pathology

## 2019-02-07 NOTE — Therapy (Signed)
[place] MAIN [facility] SERVICES [address] [place], [place], [address] Phone: [phone]   Fax:  [phone]  Patient Details  Name: Courtney King MRN: [id number] Date of Birth: November 30, 1965 Referring Provider:  No ref. provider found  Encounter Date: 02/07/2019  Speech Therapy Discharge Note  Dates of service: 07/30/2019 - 11/30/2018  The patient's insurance changed and she did not continue with speech therapy.  At her last session (11/30/2018), the patient continues to have headache when concentrating and when under stress.  Her fluency and word finding are much improved since beginning speech therapy but her ability to concentrate, work on the computer, and comprehend written information remain impaired due to headaches.  Will continue ST to address cognitive linguistic function and speech.     Leroy Sea, MS/CCC- SLP  Lou Miner 02/07/2019, 2:47 PM  [place] MAIN [facility] SERVICES [address] [place], [place], [address] Phone: [phone]   Fax:  [phone]

## 2019-08-11 ENCOUNTER — Other Ambulatory Visit: Payer: Self-pay | Admitting: *Deleted

## 2019-08-11 ENCOUNTER — Other Ambulatory Visit: Payer: Self-pay

## 2019-08-11 DIAGNOSIS — Z20822 Contact with and (suspected) exposure to covid-19: Secondary | ICD-10-CM

## 2019-08-12 LAB — NOVEL CORONAVIRUS, NAA: SARS-CoV-2, NAA: NOT DETECTED

## 2020-02-12 ENCOUNTER — Ambulatory Visit: Payer: Self-pay | Attending: Internal Medicine

## 2020-02-12 DIAGNOSIS — Z23 Encounter for immunization: Secondary | ICD-10-CM

## 2020-02-12 NOTE — Progress Notes (Signed)
   Covid-19 Vaccination Clinic  Name:  Courtney King    MRN: [id number] DOB: [birth year]  02/12/2020  [name] was observed post Covid-19 immunization for 15 minutes without incident. She was provided with Vaccine Information Sheet and instruction to access the V-Safe system.   [name] was instructed to call 911 with any severe reactions post vaccine: Marland Kitchen Difficulty breathing  . Swelling of face and throat  . A fast heartbeat  . A bad rash all over body  . Dizziness and weakness   Immunizations Administered    Name Date Dose VIS Date Route   Pfizer COVID-19 Vaccine 02/12/2020  1:33 PM 0.3 mL 10/28/2019 Intramuscular   Manufacturer: [place]   Lot: [id number]   [place]: [id number]

## 2020-02-15 ENCOUNTER — Ambulatory Visit: Payer: Self-pay

## 2020-03-06 ENCOUNTER — Ambulatory Visit: Payer: Self-pay

## 2020-03-14 ENCOUNTER — Ambulatory Visit: Payer: Self-pay

## 2020-04-11 ENCOUNTER — Other Ambulatory Visit: Payer: Self-pay

## 2020-04-11 ENCOUNTER — Ambulatory Visit
Admission: RE | Admit: 2020-04-11 | Discharge: 2020-04-11 | Disposition: A | Discharge status: Home / Self Care | Payer: Self-pay | Source: Ambulatory Visit | Attending: Oncology | Admitting: Oncology

## 2020-04-11 ENCOUNTER — Ambulatory Visit: Payer: Self-pay | Attending: Oncology

## 2020-04-11 VITALS — BP 112/70 | HR 88 | Temp 98.9°F | Ht 62.0 in | Wt 125.7 lb

## 2020-04-11 DIAGNOSIS — Z0000 Encounter for general adult medical examination without abnormal findings: Secondary | ICD-10-CM

## 2020-04-11 NOTE — Progress Notes (Signed)
  Subjective:     Patient ID: Courtney King, female   DOB: 1966-10-20, 54 y.o.   MRN: [id number]  HPI   Review of Systems     Objective:   Physical Exam Chest:     Breasts:        Right: No swelling, bleeding, inverted nipple, mass, nipple discharge, skin change or tenderness.        Left: No swelling, bleeding, inverted nipple, mass, nipple discharge, skin change or tenderness.        Assessment:     54 year old patient presents for BCCCP clinic visit.  Patient screened, and meets BCCCP eligibility.  Patient does not have insurance, Medicare or Medicaid. Instructed patient on breast self awareness using teach back method.  Clinical breast exam unremarkable. No mass or lump palpated.  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 Her mother has ovarian cancer.  Offered 6 sessions of Healing Touch through [employer].  To call back if interested. Risk Assessment    Risk Scores      04/11/2020   Last edited by: Rico Junker, RN   5-year risk: 0.9 %   Lifetime risk: 6.8 %            Plan:     Sent for bilateral screening mammogram.

## 2020-04-18 NOTE — Progress Notes (Signed)
Letter mailed from [facility] to notify of normal mammogram results.  Patient to return in one year for annual screening.  Copy to [facility]. 

## 2020-10-09 ENCOUNTER — Other Ambulatory Visit: Payer: Self-pay

## 2020-10-09 ENCOUNTER — Encounter: Payer: Self-pay | Admitting: Emergency Medicine

## 2020-10-09 ENCOUNTER — Emergency Department
Admission: EM | Admit: 2020-10-09 | Discharge: 2020-10-09 | Disposition: A | Discharge status: Home / Self Care | Payer: Self-pay | Attending: Emergency Medicine | Admitting: Emergency Medicine

## 2020-10-09 DIAGNOSIS — M549 Dorsalgia, unspecified: Secondary | ICD-10-CM | POA: Insufficient documentation

## 2020-10-09 DIAGNOSIS — R0781 Pleurodynia: Secondary | ICD-10-CM | POA: Insufficient documentation

## 2020-10-09 DIAGNOSIS — M25512 Pain in left shoulder: Secondary | ICD-10-CM | POA: Insufficient documentation

## 2020-10-09 DIAGNOSIS — Z5321 Procedure and treatment not carried out due to patient leaving prior to being seen by health care provider: Secondary | ICD-10-CM | POA: Insufficient documentation

## 2020-10-09 HISTORY — DX: Cerebral infarction, unspecified: I63.9

## 2020-10-09 NOTE — ED Notes (Signed)
Called patient to be placed in room.  Patient refused to be bedded at this time.  States she will go to [facility] and be seen.  Encouraged patient to go to treatment area being offered, patient refused.    Ambulatory.  Gait steady. NAD

## 2020-10-09 NOTE — ED Triage Notes (Signed)
Pt reports has a puppy that she is training to be a service dog and today he jumped up on her and she fell backwards. Pt c/o pain to back, left ribs and left shoulder. Pt with slow speech from previous stroke

## 2021-03-11 ENCOUNTER — Other Ambulatory Visit: Payer: Self-pay | Admitting: Family Medicine

## 2021-03-11 DIAGNOSIS — Z1231 Encounter for screening mammogram for malignant neoplasm of breast: Secondary | ICD-10-CM

## 2021-05-01 ENCOUNTER — Ambulatory Visit
Admission: RE | Admit: 2021-05-01 | Discharge: 2021-05-01 | Disposition: A | Discharge status: Home / Self Care | Payer: Medicare HMO | Source: Ambulatory Visit | Attending: Family Medicine | Admitting: Family Medicine

## 2021-05-01 ENCOUNTER — Other Ambulatory Visit: Payer: Self-pay

## 2021-05-01 DIAGNOSIS — Z1231 Encounter for screening mammogram for malignant neoplasm of breast: Secondary | ICD-10-CM

## 2021-10-02 IMAGING — MG MM DIGITAL SCREENING BILAT W/ TOMO AND CAD
8 series · 9 of 24 positions shown · non-contrast
Comparison: Previous exam(s).

ACR Breast Density Category a: The breast tissue is almost entirely
fatty.

CLINICAL DATA: Screening.

EXAM:
DIGITAL SCREENING BILATERAL MAMMOGRAM WITH TOMOSYNTHESIS AND CAD
TECHNIQUE: Bilateral screening digital craniocaudal and mediolateral oblique
mammograms were obtained. Bilateral screening digital breast
tomosynthesis was performed. The images were evaluated with
computer-aided detection.

[L CC synth-2D]
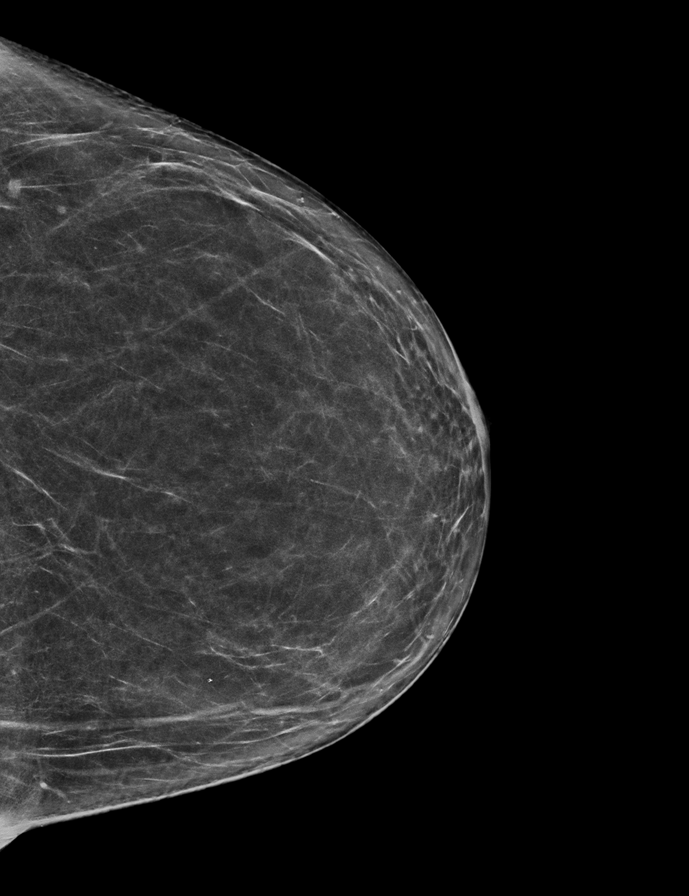

[R MLO synth-2D]
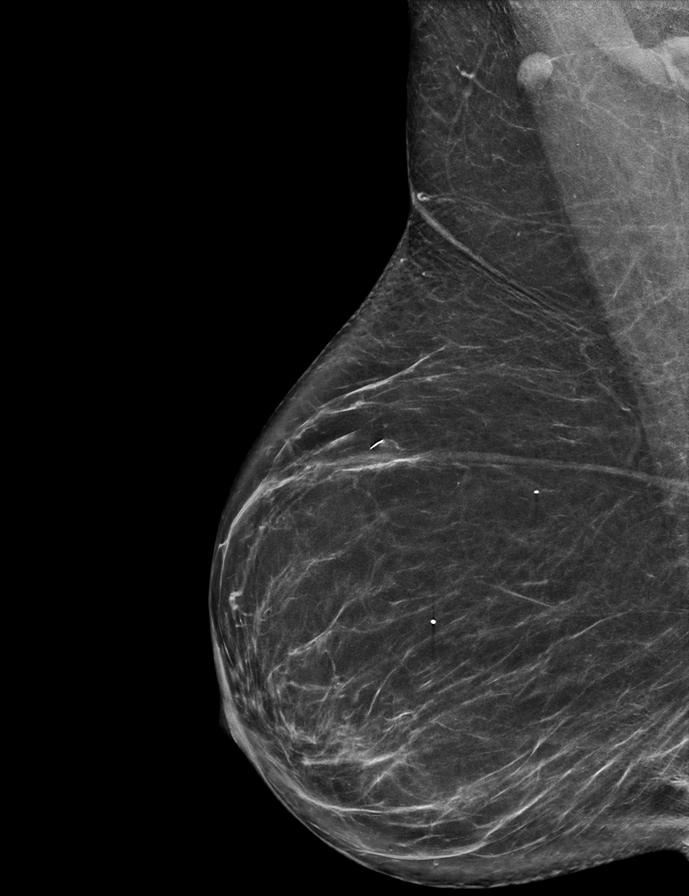

[L MLO synth-2D]
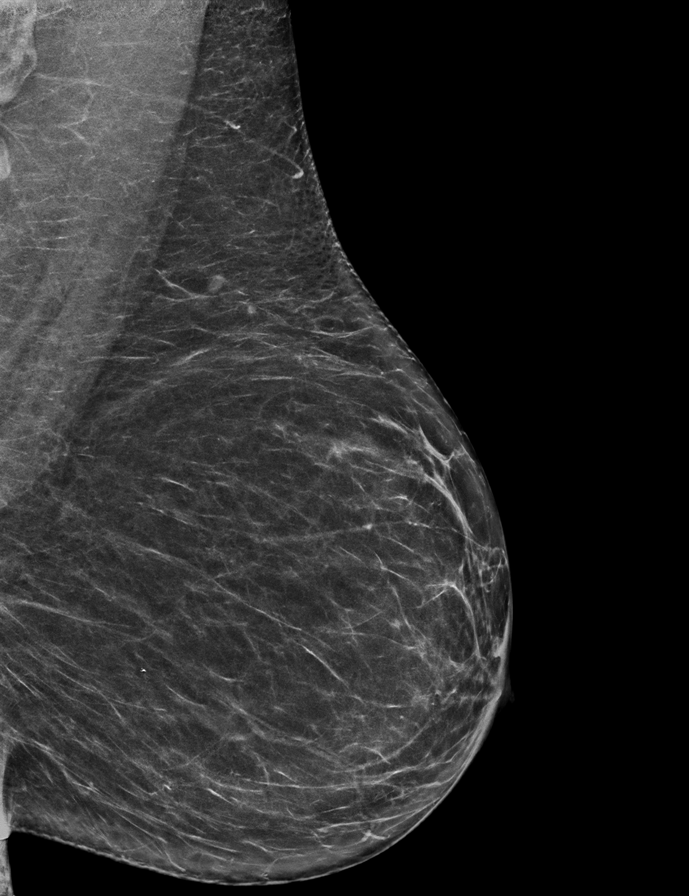

[R CC synth-2D]
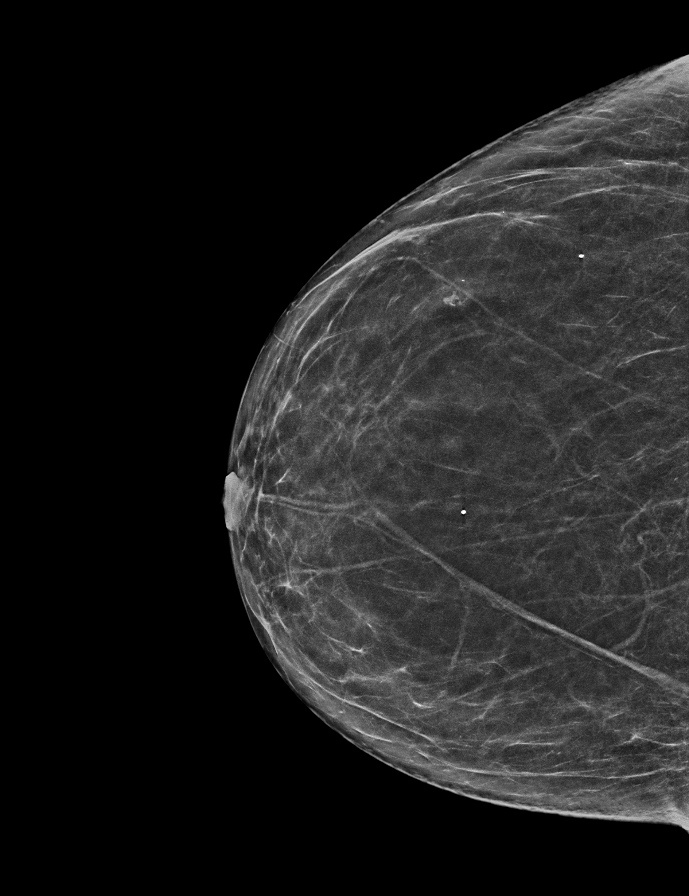

[L CC tomo · 2 of 62 frames shown]
[frame 21/62]
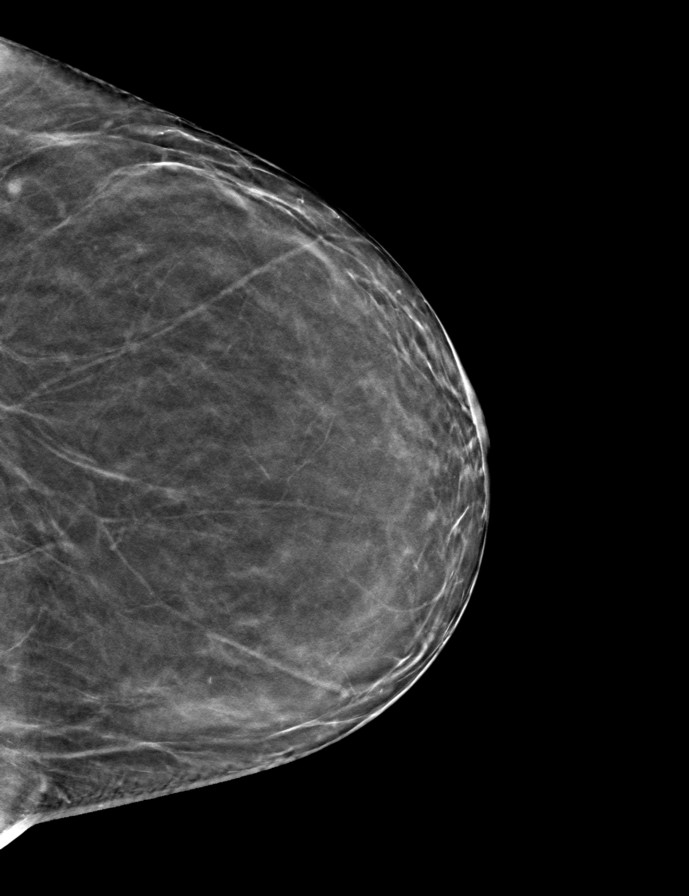
[frame 31/62]
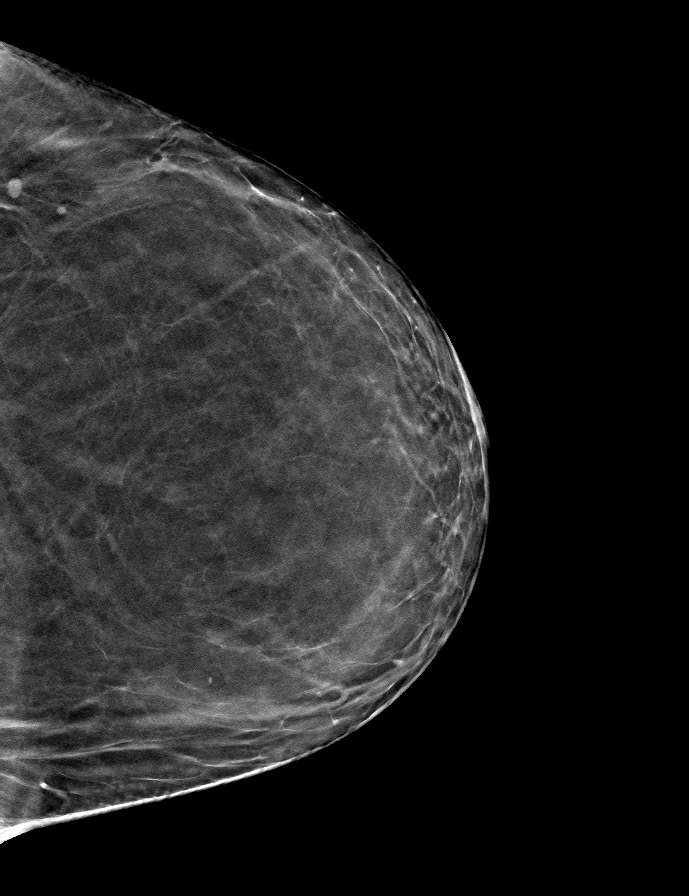

[R CC tomo · tomo slice 31/60.0]
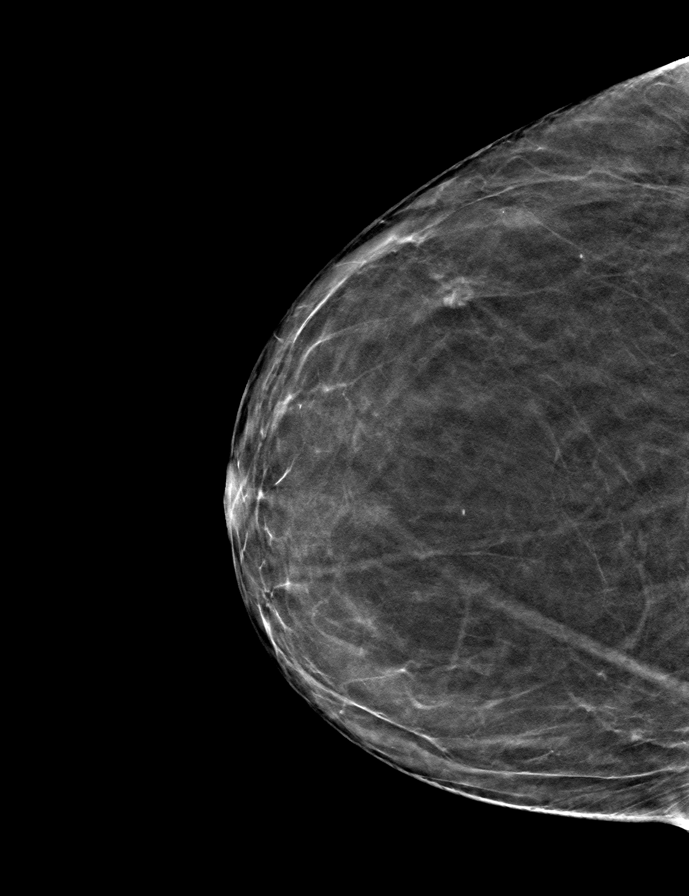

[L MLO tomo · tomo slice 33/64.0]
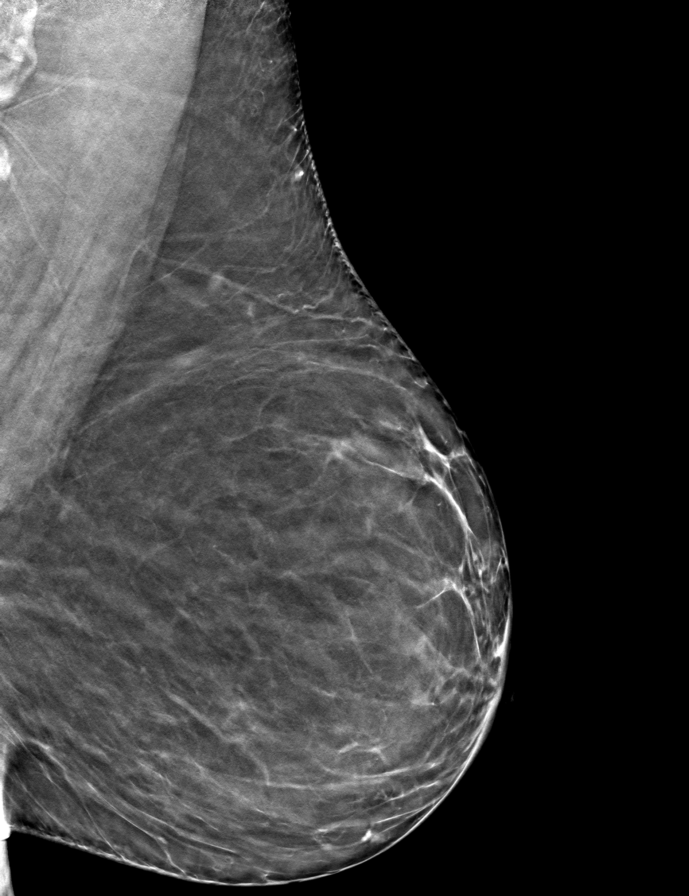

[R MLO tomo · tomo slice 34/67.0]
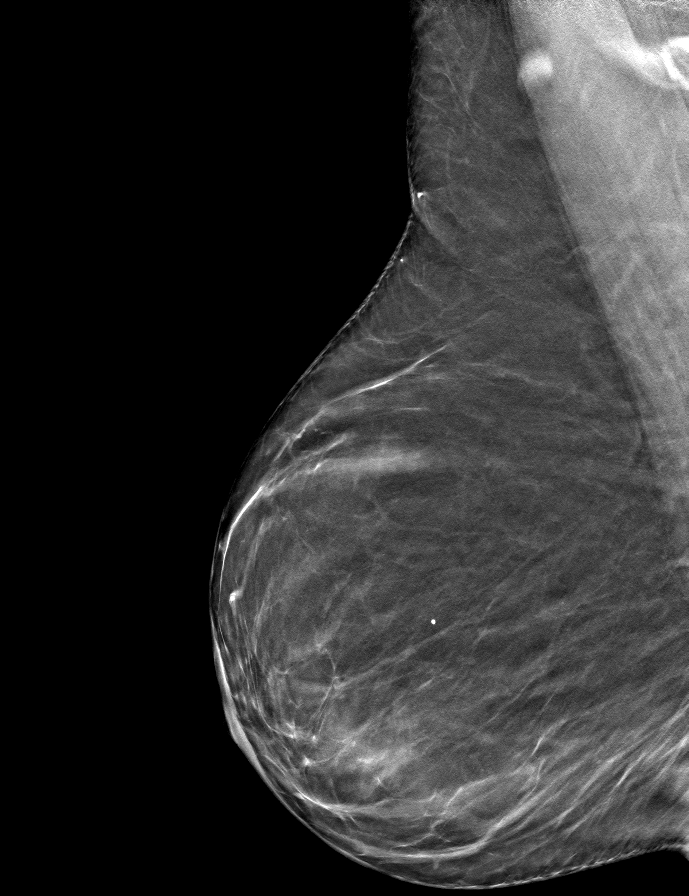

[9 of 24 positions shown; findings below may reference images not displayed]

FINDINGS: There are no findings suspicious for malignancy.
IMPRESSION: No mammographic evidence of malignancy. A result letter of this
screening mammogram will be mailed directly to the patient.

RECOMMENDATION:
Screening mammogram in one year. (Code:0E-3-N98)

BI-RADS CATEGORY  1: Negative.

## 2022-01-28 ENCOUNTER — Ambulatory Visit (INDEPENDENT_AMBULATORY_CARE_PROVIDER_SITE_OTHER): Payer: Medicare HMO | Admitting: Vascular Surgery

## 2022-01-28 ENCOUNTER — Encounter (INDEPENDENT_AMBULATORY_CARE_PROVIDER_SITE_OTHER): Payer: Self-pay | Admitting: Vascular Surgery

## 2022-01-28 ENCOUNTER — Other Ambulatory Visit: Payer: Self-pay

## 2022-01-28 VITALS — BP 125/75 | HR 87 | Resp 16 | Ht 62.0 in | Wt 132.0 lb

## 2022-01-28 DIAGNOSIS — E782 Mixed hyperlipidemia: Secondary | ICD-10-CM

## 2022-01-28 DIAGNOSIS — I10 Essential (primary) hypertension: Secondary | ICD-10-CM | POA: Diagnosis not present

## 2022-01-28 DIAGNOSIS — Z9889 Other specified postprocedural states: Secondary | ICD-10-CM

## 2022-01-28 DIAGNOSIS — I639 Cerebral infarction, unspecified: Secondary | ICD-10-CM | POA: Insufficient documentation

## 2022-01-28 NOTE — Assessment & Plan Note (Signed)
Patient has had what sounds like an aortoiliac endarterectomy almost a decade ago for disabling claudication.  This has not been checked in many years.  Aortoiliac duplex and ABIs at her convenience.  Her pedal pulses are pretty good and she does not currently have any rest pain or disabling claudication symptoms. ?

## 2022-01-28 NOTE — Progress Notes (Signed)
? ? ?Patient ID: Courtney King, female   DOB: 1965-12-26, 56 y.o.   MRN: [id number] ? ?Chief Complaint  ?Patient presents with  ? Establish Care  ?  Ref dr Vincenza Hews for consult ?  ? ? ?HPI ?Courtney King is a 56 y.o. female.  I am asked to see the patient by A. Krebs for evaluation of multiple vascular issues that have not been checked in many years.  The patient has a history of aortic endarterectomy almost a decade ago for disabling claudication symptoms.  She says it has not been checked in many years.  She does still have some pain in her legs with walking but nowhere near as severe as it was when she had this done.  No wounds or infection.  No rest pain. ?She also has a previous history of stroke and is complaining of severe headaches currently.  She says she had headaches with her strokes back several years ago.  She says she does not know if they checked her carotid arteries at that time but it has been many years since they have been checked.  No focal arm or leg weakness or numbness, speech or swallowing difficulty, or temporary monocular blindness.   ? ? ?Past Medical History:  ?Diagnosis Date  ? Allergy   ? Anxiety   ? Diabetes mellitus without complication ([place])   ? GERD (gastroesophageal reflux disease)   ? H/O blood clots 2014  ? in abdominal aorta - endarterectomy at [facility]  ? Hyperlipidemia   ? Hypertension   ? Left leg claudication ([place])   ? Stroke [facility])   ? ? ?Past Surgical History:  ?Procedure Laterality Date  ? ABDOMINAL HYSTERECTOMY  2004  ? AORTIC ENDARTERECETOMY  2013  ? CHOLECYSTECTOMY  1999  ? COLONOSCOPY WITH PROPOFOL N/A 04/03/2017  ? Procedure: COLONOSCOPY WITH PROPOFOL;  Surgeon: Jonathon Bellows, MD;  Location: [facility] ENDOSCOPY;  Service: Endoscopy;  Laterality: N/A;  ? ESOPHAGOGASTRODUODENOSCOPY (EGD) WITH PROPOFOL N/A 04/03/2017  ? Procedure: ESOPHAGOGASTRODUODENOSCOPY (EGD) WITH PROPOFOL;  Surgeon: Jonathon Bellows, MD;  Location: [facility] ENDOSCOPY;  Service: Endoscopy;  Laterality: N/A;  ? EUS  N/A 04/30/2017  ? Procedure: FULL UPPER ENDOSCOPIC ULTRASOUND (EUS) RADIAL;  Surgeon: Jola Schmidt, MD;  Location: [facility] ENDOSCOPY;  Service: Endoscopy;  Laterality: N/A;  ? FOOT SURGERY Left   ? plantar fasciitis  ? LAPAROSCOPIC NISSEN FUNDOPLICATION  0539  ? done in [place] to eliminate acid reflux  ? LAPAROSCOPIC SIGMOID COLECTOMY N/A 06/29/2018  ? Procedure: LAPAROSCOPIC SIGMOID COLECTOMY;  Surgeon: Jules Husbands, MD;  Location: [facility] ORS;  Service: General;  Laterality: N/A;  ? OOPHORECTOMY Bilateral 2004  ? ? ? ?Family History  ?Problem Relation Age of Onset  ? Ovarian cancer Mother   ? Ovarian cancer Maternal Aunt   ? Stroke Maternal Grandmother   ? Stroke Paternal Grandmother   ? ? ? ? ?Social History  ? ?Tobacco Use  ? Smoking status: Every Day  ?  Packs/day: 1.00  ?  Years: 20.00  ?  Pack years: 20.00  ?  Types: Cigarettes  ? Smokeless tobacco: Current  ?Vaping Use  ? Vaping Use: Never used  ?Substance Use Topics  ? Alcohol use: No  ?  Alcohol/week: 0.0 standard drinks  ? Drug use: No  ? ? ? ?Allergies  ?Allergen Reactions  ? Codeine Itching, Nausea And Vomiting, Nausea Only and Other (See Comments)  ?  Other Reaction: Not Assessed ?Other Reaction: Not Assessed ?Other Reaction: Not Assessed ?Other  Reaction: Not Assessed  ? ? ?Current Outpatient Medications  ?Medication Sig Dispense Refill  ? clonazePAM (KLONOPIN) 0.5 MG tablet Take 0.5 mg by mouth at bedtime as needed.     ? clopidogrel (PLAVIX) 75 MG tablet Take 75 mg by mouth daily.    ? lisinopril (PRINIVIL,ZESTRIL) 10 MG tablet Take 10 mg by mouth daily.    ? traZODone (DESYREL) 50 MG tablet Take 50 mg by mouth at bedtime.    ? venlafaxine (EFFEXOR) 100 MG tablet Take 200 mg by mouth daily.    ? venlafaxine (EFFEXOR) 50 MG tablet Take 50 mg by mouth daily.    ? atorvastatin (LIPITOR) 20 MG tablet Take 1 tablet (20 mg total) by mouth daily. 30 tablet 1  ? Lancets Misc. (UNISTIK 2 NORMAL) MISC Check blood sugar daily.    ? linaclotide (LINZESS) 145 MCG  CAPS capsule Take 1 capsule (145 mcg total) by mouth daily before breakfast. 90 capsule 0  ? ?No current facility-administered medications for this visit.  ? ? ? ? ?REVIEW OF SYSTEMS (Negative unless checked) ? ?Constitutional: '[]'$ Weight loss  '[]'$ Fever  '[]'$ Chills ?Cardiac: '[]'$ Chest pain   '[]'$ Chest pressure   '[]'$ Palpitations   '[]'$ Shortness of breath when laying flat   '[]'$ Shortness of breath at rest   '[]'$ Shortness of breath with exertion. ?Vascular:  '[x]'$ Pain in legs with walking   '[]'$ Pain in legs at rest   '[]'$ Pain in legs when laying flat   '[]'$ Claudication   '[]'$ Pain in feet when walking  '[]'$ Pain in feet at rest  '[]'$ Pain in feet when laying flat   '[]'$ History of DVT   '[]'$ Phlebitis   '[]'$ Swelling in legs   '[]'$ Varicose veins   '[]'$ Non-healing ulcers ?Pulmonary:   '[]'$ Uses home oxygen   '[]'$ Productive cough   '[]'$ Hemoptysis   '[]'$ Wheeze  '[]'$ COPD   '[]'$ Asthma ?Neurologic:  '[]'$ Dizziness  '[]'$ Blackouts   '[]'$ Seizures   '[x]'$ History of stroke   '[]'$ History of TIA  '[]'$ Aphasia   '[]'$ Temporary blindness   '[]'$ Dysphagia   '[]'$ Weakness or numbness in arms   '[]'$ Weakness or numbness in legs X complains of headaches ?Musculoskeletal:  '[x]'$ Arthritis   '[]'$ Joint swelling   '[x]'$ Joint pain   '[]'$ Low back pain ?Hematologic:  '[]'$ Easy bruising  '[]'$ Easy bleeding   '[]'$ Hypercoagulable state   '[]'$ Anemic  '[]'$ Hepatitis ?Gastrointestinal:  '[]'$ Blood in stool   '[]'$ Vomiting blood  '[x]'$ Gastroesophageal reflux/heartburn   '[]'$ Abdominal pain ?Genitourinary:  '[]'$ Chronic kidney disease   '[]'$ Difficult urination  '[]'$ Frequent urination  '[]'$ Burning with urination   '[]'$ Hematuria ?Skin:  '[]'$ Rashes   '[]'$ Ulcers   '[]'$ Wounds ?Psychological:  '[x]'$ History of anxiety   '[]'$  History of major depression. ? ? ? ?Physical Exam ?BP 125/75 (BP Location: Right Arm)   Pulse 87   Resp 16   Ht '5\' 2"'$  (1.575 m)   Wt 132 lb (59.9 kg)   BMI 24.14 kg/m?  ?Gen:  WD/WN, NAD.  Appears older than stated age ?Head: [place]/AT, No temporalis wasting.  ?Ear/Nose/Throat: Hearing grossly intact, nares w/o erythema or drainage, oropharynx w/o Erythema/Exudate ?Eyes:  Conjunctiva clear, sclera non-icteric  ?Neck: trachea midline.  No JVD.  ?Pulmonary:  Good air movement, respirations not labored, no use of accessory muscles  ?Cardiac: RRR, no JVD ?Vascular:  ?Vessel Right Left  ?Radial Palpable Palpable  ?    ?    ?    ?    ?    ?    ?DP 2+ 2+  ?PT 1+ 1+  ? ?Gastrointestinal:. No masses, surgical incisions, or scars. ?Musculoskeletal: M/S 5/5 throughout.  Extremities  without ischemic changes.  No deformity or atrophy.  No significant lower extremity edema. ?Neurologic: Sensation grossly intact in extremities.  Symmetrical.  Speech is fluent. Motor exam as listed above. ?Psychiatric: Judgment intact, Mood & affect appropriate for pt's clinical situation. ?Dermatologic: No rashes or ulcers noted.  No cellulitis or open wounds. ? ? ? ?Radiology ?No results found. ? ?Labs ?No results found for this or any previous visit (from the past 2160 hour(s)). ? ?Assessment/Plan: ? ?Hx of endarterectomy ?Patient has had what sounds like an aortoiliac endarterectomy almost a decade ago for disabling claudication.  This has not been checked in many years.  Aortoiliac duplex and ABIs at her convenience.  Her pedal pulses are pretty good and she does not currently have any rest pain or disabling claudication symptoms. ? ?Hyperlipidemia, mixed ?lipid control important in reducing the progression of atherosclerotic disease. Continue statin therapy ? ? ?Essential hypertension ?blood pressure control important in reducing the progression of atherosclerotic disease. On appropriate oral medications. ? ? ?Stroke [facility]) ?Patient has a history of stroke and has not had her carotids checked in many years.  She is complaining of severe headaches currently.  We will obtain a carotid duplex in the near future at her convenience. ? ? ? ? ? ?[place] Pain ?01/28/2022, 2:54 PM ? ? ?This note was created with Dragon medical transcription system.  Any errors from dictation are unintentional.    ?

## 2022-01-28 NOTE — Assessment & Plan Note (Signed)
blood pressure control important in reducing the progression of atherosclerotic disease. On appropriate oral medications.  

## 2022-01-28 NOTE — Assessment & Plan Note (Signed)
Patient has a history of stroke and has not had her carotids checked in many years.  She is complaining of severe headaches currently.  We will obtain a carotid duplex in the near future at her convenience. ?

## 2022-01-28 NOTE — Assessment & Plan Note (Signed)
lipid control important in reducing the progression of atherosclerotic disease. Continue statin therapy  

## 2022-01-29 ENCOUNTER — Ambulatory Visit (INDEPENDENT_AMBULATORY_CARE_PROVIDER_SITE_OTHER): Payer: Medicare HMO

## 2022-01-29 DIAGNOSIS — Z9889 Other specified postprocedural states: Secondary | ICD-10-CM

## 2022-01-29 DIAGNOSIS — I639 Cerebral infarction, unspecified: Secondary | ICD-10-CM | POA: Diagnosis not present

## 2022-02-04 ENCOUNTER — Encounter (INDEPENDENT_AMBULATORY_CARE_PROVIDER_SITE_OTHER): Payer: Self-pay

## 2022-02-04 ENCOUNTER — Ambulatory Visit (INDEPENDENT_AMBULATORY_CARE_PROVIDER_SITE_OTHER): Payer: Medicare HMO | Admitting: Vascular Surgery

## 2022-03-24 ENCOUNTER — Other Ambulatory Visit: Payer: Self-pay | Admitting: Family Medicine

## 2022-03-24 DIAGNOSIS — Z1231 Encounter for screening mammogram for malignant neoplasm of breast: Secondary | ICD-10-CM

## 2022-05-13 ENCOUNTER — Other Ambulatory Visit: Payer: Self-pay | Admitting: Surgery

## 2022-05-19 ENCOUNTER — Encounter
Admission: RE | Admit: 2022-05-19 | Discharge: 2022-05-19 | Disposition: A | Discharge status: Home / Self Care | Payer: Medicare HMO | Source: Ambulatory Visit | Attending: Surgery | Admitting: Surgery

## 2022-05-19 VITALS — Ht 61.0 in | Wt 146.0 lb

## 2022-05-19 DIAGNOSIS — I10 Essential (primary) hypertension: Secondary | ICD-10-CM

## 2022-05-19 DIAGNOSIS — Z01818 Encounter for other preprocedural examination: Secondary | ICD-10-CM | POA: Diagnosis not present

## 2022-05-19 HISTORY — DX: Anemia, unspecified: D64.9

## 2022-05-19 HISTORY — DX: Prediabetes: R73.03

## 2022-05-19 LAB — CBC
HCT: 36.1 % (ref 36.0–46.0)
Hemoglobin: 10.8 g/dL — ABNORMAL LOW (ref 12.0–15.0)
MCH: 25.5 pg — ABNORMAL LOW (ref 26.0–34.0)
MCHC: 29.9 g/dL — ABNORMAL LOW (ref 30.0–36.0)
MCV: 85.3 fL (ref 80.0–100.0)
Platelets: 435 10*3/uL — ABNORMAL HIGH (ref 150–400)
RBC: 4.23 MIL/uL (ref 3.87–5.11)
RDW: 18.7 % — ABNORMAL HIGH (ref 11.5–15.5)
WBC: 5.2 10*3/uL (ref 4.0–10.5)
nRBC: 0 % (ref 0.0–0.2)

## 2022-05-19 NOTE — Patient Instructions (Addendum)
Your procedure is scheduled on: Wednesday May 21, 2022. Report to Day Surgery inside [facility] 2nd floor, stop by admissions desk before getting on elevator. To find out your arrival time please call [phone] between 1PM - 3PM on Monday July 3rd, 2023.  Remember: Instructions that are not followed completely may result in serious medical risk,  up to and including death, or upon the discretion of your surgeon and anesthesiologist your  surgery may need to be rescheduled.     _X__ 1. Do not eat food after midnight the night before your procedure.                 No chewing gum or hard candies. You may drink clear liquids up to 2 hours                 before you are scheduled to arrive for your surgery- DO not drink clear                 liquids within 2 hours of the start of your surgery.                 Clear Liquids include:  water, apple juice without pulp, clear Gatorade, G2 or                  Gatorade Zero (avoid Red/Purple/Blue), Black Coffee or Tea (Do not add                 anything to coffee or tea).  __X__2.   Complete the "Ensure Clear Pre-surgery Clear Carbohydrate Drink" provided to you, 2 hours before arrival. **If you are diabetic you will be provided with an alternative drink, Gatorade Zero or G2.  __X__3.  On the morning of surgery brush your teeth with toothpaste and water, you                may rinse your mouth with mouthwash if you wish.  Do not swallow any toothpaste or mouthwash.     _X__ 4.  No Alcohol for 24 hours before or after surgery.   _X__ 5.  Do Not Smoke or use e-cigarettes For 24 Hours Prior to Your Surgery.                 Do not use any chewable tobacco products for at least 6 hours prior to                 Surgery.  _X__  6.  Do not use any recreational drugs (marijuana, cocaine, heroin, ecstasy, MDMA or other)                For at least one week prior to your surgery.  Combination of these drugs with anesthesia                 May have life threatening results.  ____  7.  Bring all medications with you on the day of surgery if instructed.   __X__  8.  Notify your doctor if there is any change in your medical condition      (cold, fever, infections).     Do not wear jewelry, make-up, hairpins, clips or nail polish. Do not wear lotions, powders, or perfumes. You may wear deodorant. Do not shave 48 hours prior to surgery.  Do not bring valuables to the hospital.    [facility] is not responsible for any belongings or valuables.  Contacts, dentures or bridgework may  not be worn into surgery. Leave your suitcase in the car. After surgery it may be brought to your room. For patients admitted to the hospital, discharge time is determined by your treatment team.   Patients discharged the day of surgery will not be allowed to drive home.   Make arrangements for someone to be with you for the first 24 hours of your Same Day Discharge.   __X__ Take these medicines the morning of surgery with A SIP OF WATER:    1. None   2.   3.   4.  5.  6.  ____ Fleet Enema (as directed)   __X__ Use CHG Soap (or wipes) as directed  ____ Use Benzoyl Peroxide Gel as instructed  ____ Use inhalers on the day of surgery  ____ Stop metformin 2 days prior to surgery    ____ Take 1/2 of usual insulin dose the night before surgery. No insulin the morning          of surgery.   __X__ Stop clopidogrel (PLAVIX) 75 MG today 05/19/22 as instructed by your doctor.  __X__ One Week prior to surgery- Stop Anti-inflammatories such as Ibuprofen, Aleve, Advil, Motrin, meloxicam (MOBIC), diclofenac, etodolac, ketorolac, Toradol, Daypro, piroxicam, Goody's or BC powders. OK TO USE TYLENOL IF NEEDED   __X__ Stop supplements until after surgery.    ____ Bring C-Pap to the hospital.    If you have any questions regarding your pre-procedure instructions,  Please call Pre-admit Testing at [phone]

## 2022-05-20 MED ORDER — CHLORHEXIDINE GLUCONATE 0.12 % MT SOLN: 15.0000 mL | Freq: Once | OROMUCOSAL | Status: AC

## 2022-05-20 MED ORDER — LACTATED RINGERS IV SOLN: INTRAVENOUS | Status: DC

## 2022-05-20 MED ORDER — ORAL CARE MOUTH RINSE: 15.0000 mL | Freq: Once | OROMUCOSAL | Status: AC

## 2022-05-20 MED ORDER — CEFAZOLIN SODIUM-DEXTROSE 2-4 GM/100ML-% IV SOLN
2.0000 g | INTRAVENOUS | Status: AC
  Administered 2022-05-21: 2 g via INTRAVENOUS

## 2022-05-20 MED ORDER — FAMOTIDINE 20 MG PO TABS: 20.0000 mg | ORAL_TABLET | Freq: Once | ORAL | Status: AC

## 2022-05-21 ENCOUNTER — Other Ambulatory Visit: Payer: Self-pay

## 2022-05-21 ENCOUNTER — Encounter
Admission: RE | Disposition: A | Discharge status: Home / Self Care | Payer: Self-pay | Source: Home / Self Care | Attending: Surgery

## 2022-05-21 ENCOUNTER — Encounter: Payer: Self-pay | Admitting: Surgery

## 2022-05-21 ENCOUNTER — Ambulatory Visit: Payer: Medicare HMO | Admitting: Urgent Care

## 2022-05-21 ENCOUNTER — Ambulatory Visit
Admission: RE | Admit: 2022-05-21 | Discharge: 2022-05-21 | Disposition: A | Discharge status: Home / Self Care | Payer: Medicare HMO | Attending: Surgery | Admitting: Surgery

## 2022-05-21 DIAGNOSIS — Z87891 Personal history of nicotine dependence: Secondary | ICD-10-CM | POA: Diagnosis not present

## 2022-05-21 DIAGNOSIS — E119 Type 2 diabetes mellitus without complications: Secondary | ICD-10-CM | POA: Insufficient documentation

## 2022-05-21 DIAGNOSIS — Z8673 Personal history of transient ischemic attack (TIA), and cerebral infarction without residual deficits: Secondary | ICD-10-CM | POA: Insufficient documentation

## 2022-05-21 DIAGNOSIS — G5601 Carpal tunnel syndrome, right upper limb: Secondary | ICD-10-CM | POA: Insufficient documentation

## 2022-05-21 DIAGNOSIS — K219 Gastro-esophageal reflux disease without esophagitis: Secondary | ICD-10-CM | POA: Diagnosis not present

## 2022-05-21 DIAGNOSIS — E785 Hyperlipidemia, unspecified: Secondary | ICD-10-CM | POA: Diagnosis not present

## 2022-05-21 DIAGNOSIS — Z90710 Acquired absence of both cervix and uterus: Secondary | ICD-10-CM | POA: Diagnosis not present

## 2022-05-21 DIAGNOSIS — I10 Essential (primary) hypertension: Secondary | ICD-10-CM | POA: Insufficient documentation

## 2022-05-21 DIAGNOSIS — Z9049 Acquired absence of other specified parts of digestive tract: Secondary | ICD-10-CM | POA: Diagnosis not present

## 2022-05-21 HISTORY — PX: CARPAL TUNNEL RELEASE: SHX101

## 2022-05-21 SURGERY — RELEASE, CARPAL TUNNEL, ENDOSCOPIC
Anesthesia: Monitor Anesthesia Care | Site: Wrist | Laterality: Right

## 2022-05-21 MED ORDER — MIDAZOLAM HCL 5 MG/5ML IJ SOLN
INTRAMUSCULAR | Status: DC | PRN
  Administered 2022-05-21: 2 mg via INTRAVENOUS

## 2022-05-21 MED ORDER — ONDANSETRON HCL 4 MG/2ML IJ SOLN
INTRAMUSCULAR | Status: AC
  Filled 2022-05-21: qty 2

## 2022-05-21 MED ORDER — PROPOFOL 500 MG/50ML IV EMUL
INTRAVENOUS | Status: DC | PRN
  Administered 2022-05-21: 120 ug/kg/min via INTRAVENOUS

## 2022-05-21 MED ORDER — FENTANYL CITRATE (PF) 100 MCG/2ML IJ SOLN
INTRAMUSCULAR | Status: DC | PRN
  Administered 2022-05-21 (×2): 50 ug via INTRAVENOUS

## 2022-05-21 MED ORDER — LIDOCAINE HCL (PF) 0.5 % IJ SOLN
INTRAMUSCULAR | Status: DC | PRN
  Administered 2022-05-21: 50 mL via INTRAVENOUS

## 2022-05-21 MED ORDER — PHENYLEPHRINE HCL (PRESSORS) 10 MG/ML IV SOLN
INTRAVENOUS | Status: DC | PRN
  Administered 2022-05-21 (×2): 80 ug via INTRAVENOUS

## 2022-05-21 MED ORDER — LIDOCAINE HCL (PF) 0.5 % IJ SOLN
INTRAMUSCULAR | Status: AC
  Filled 2022-05-21: qty 50

## 2022-05-21 MED ORDER — OXYCODONE HCL 5 MG/5ML PO SOLN: 5.0000 mg | Freq: Once | ORAL | Status: DC | PRN

## 2022-05-21 MED ORDER — FENTANYL CITRATE (PF) 100 MCG/2ML IJ SOLN
INTRAMUSCULAR | Status: AC
  Filled 2022-05-21: qty 2

## 2022-05-21 MED ORDER — BUPIVACAINE HCL (PF) 0.5 % IJ SOLN
INTRAMUSCULAR | Status: DC | PRN
  Administered 2022-05-21: 10 mL

## 2022-05-21 MED ORDER — CEFAZOLIN SODIUM-DEXTROSE 2-4 GM/100ML-% IV SOLN
INTRAVENOUS | Status: AC
  Filled 2022-05-21: qty 100

## 2022-05-21 MED ORDER — FAMOTIDINE 20 MG PO TABS
ORAL_TABLET | ORAL | Status: AC
  Administered 2022-05-21: 20 mg via ORAL
  Filled 2022-05-21: qty 1

## 2022-05-21 MED ORDER — MIDAZOLAM HCL 2 MG/2ML IJ SOLN
INTRAMUSCULAR | Status: AC
  Filled 2022-05-21: qty 2

## 2022-05-21 MED ORDER — 0.9 % SODIUM CHLORIDE (POUR BTL) OPTIME
TOPICAL | Status: DC | PRN
  Administered 2022-05-21: 500 mL

## 2022-05-21 MED ORDER — LIDOCAINE HCL (PF) 2 % IJ SOLN
INTRAMUSCULAR | Status: AC
  Filled 2022-05-21: qty 5

## 2022-05-21 MED ORDER — FENTANYL CITRATE (PF) 100 MCG/2ML IJ SOLN: 25.0000 ug | INTRAMUSCULAR | Status: DC | PRN

## 2022-05-21 MED ORDER — PROPOFOL 10 MG/ML IV BOLUS
INTRAVENOUS | Status: AC
  Filled 2022-05-21: qty 20

## 2022-05-21 MED ORDER — CHLORHEXIDINE GLUCONATE 0.12 % MT SOLN
OROMUCOSAL | Status: AC
  Administered 2022-05-21: 15 mL via OROMUCOSAL
  Filled 2022-05-21: qty 15

## 2022-05-21 MED ORDER — OXYCODONE HCL 5 MG PO TABS: 5.0000 mg | ORAL_TABLET | Freq: Once | ORAL | Status: DC | PRN

## 2022-05-21 MED ORDER — TRAMADOL HCL 50 MG PO TABS: 50.0000 mg | ORAL_TABLET | Freq: Four times a day (QID) | ORAL | 0 refills | Status: AC | PRN

## 2022-05-21 MED ORDER — ONDANSETRON HCL 4 MG/2ML IJ SOLN
INTRAMUSCULAR | Status: DC | PRN
  Administered 2022-05-21: 4 mg via INTRAVENOUS

## 2022-05-21 MED ORDER — PHENYLEPHRINE 80 MCG/ML (10ML) SYRINGE FOR IV PUSH (FOR BLOOD PRESSURE SUPPORT)
PREFILLED_SYRINGE | INTRAVENOUS | Status: AC
  Filled 2022-05-21: qty 10

## 2022-05-21 MED ORDER — BUPIVACAINE HCL (PF) 0.5 % IJ SOLN
INTRAMUSCULAR | Status: AC
  Filled 2022-05-21: qty 30

## 2022-05-21 SURGICAL SUPPLY — 32 items
APL PRP STRL LF DISP 70% ISPRP (MISCELLANEOUS) ×1
BNDG COHESIVE 4X5 TAN ST LF (GAUZE/BANDAGES/DRESSINGS) ×1 IMPLANT
BNDG ELASTIC 2X5.8 VLCR STR LF (GAUZE/BANDAGES/DRESSINGS) ×2 IMPLANT
BNDG ESMARK 4X12 TAN STRL LF (GAUZE/BANDAGES/DRESSINGS) ×2 IMPLANT
CHLORAPREP W/TINT 26 (MISCELLANEOUS) ×2 IMPLANT
CORD BIP STRL DISP 12FT (MISCELLANEOUS) ×2 IMPLANT
CUFF TOURN SGL QUICK 18X4 (TOURNIQUET CUFF) ×1 IMPLANT
DRAPE SURG 17X11 SM STRL (DRAPES) ×2 IMPLANT
FORCEPS JEWEL BIP 4-3/4 STR (INSTRUMENTS) ×2 IMPLANT
GAUZE SPONGE 4X4 12PLY STRL (GAUZE/BANDAGES/DRESSINGS) ×2 IMPLANT
GAUZE XEROFORM 1X8 LF (GAUZE/BANDAGES/DRESSINGS) ×2 IMPLANT
GLOVE BIO SURGEON STRL SZ8 (GLOVE) ×2 IMPLANT
GLOVE SURG UNDER LTX SZ8 (GLOVE) ×2 IMPLANT
GOWN STRL REUS W/ TWL LRG LVL3 (GOWN DISPOSABLE) ×1 IMPLANT
GOWN STRL REUS W/ TWL XL LVL3 (GOWN DISPOSABLE) ×1 IMPLANT
GOWN STRL REUS W/TWL LRG LVL3 (GOWN DISPOSABLE) ×2
GOWN STRL REUS W/TWL XL LVL3 (GOWN DISPOSABLE) ×2
KIT CARPAL TUNNEL (MISCELLANEOUS) ×2
KIT ESCP INSRT D SLOT CANN KN (MISCELLANEOUS) ×1 IMPLANT
KIT TURNOVER KIT A (KITS) ×2 IMPLANT
MANIFOLD NEPTUNE II (INSTRUMENTS) ×2 IMPLANT
NS IRRIG 500ML POUR BTL (IV SOLUTION) ×2 IMPLANT
PACK EXTREMITY ARMC (MISCELLANEOUS) ×2 IMPLANT
SPLINT WRIST LG LT TX990309 (SOFTGOODS) IMPLANT
SPLINT WRIST LG RT TX900304 (SOFTGOODS) IMPLANT
SPLINT WRIST M LT TX990308 (SOFTGOODS) IMPLANT
SPLINT WRIST M RT TX990303 (SOFTGOODS) ×1 IMPLANT
SPLINT WRIST XL LT TX990310 (SOFTGOODS) IMPLANT
SPLINT WRIST XL RT TX990305 (SOFTGOODS) IMPLANT
STOCKINETTE IMPERVIOUS 9X36 MD (GAUZE/BANDAGES/DRESSINGS) ×2 IMPLANT
SUT PROLENE 4 0 PS 2 18 (SUTURE) ×2 IMPLANT
WATER STERILE IRR 500ML POUR (IV SOLUTION) ×2 IMPLANT

## 2022-05-21 NOTE — Anesthesia Procedure Notes (Signed)
Anesthesia Regional Block: Bier block (IV Regional)   Pre-Anesthetic Checklist: , timeout performed,  Correct Patient, Correct Site, Correct Laterality,  Correct Procedure, Correct Position, site marked,  Risks and benefits discussed,  Surgical consent,  Pre-op evaluation,  At surgeon's request  Laterality: Right  Prep: alcohol swabs       Needles:  Injection technique: Single-shot      Additional Needles:   Procedures:,,,,,, Esmarch exsanguination,  #20gu IV placed and double tourniquet utilized    Narrative:  Start time: 05/21/2022 11:02 AM End time: 05/21/2022 11:03 AM  Performed by: Personally   Additional Notes: Pt. Tolerated well, no complications noted

## 2022-05-21 NOTE — H&P (Signed)
History of Present Illness:  Courtney King is a 56 y.o. female who presents for evaluation and treatment of her bilateral hand and wrist pain and paresthesias, right more symptomatic than left. The patient notes that the symptoms have been present for several years but have worsened over the past 3 months or so. She saw Reche Dixon, PA-C, who diagnosed her with carpal tunnel syndrome. She was provided volar wrist splints to wear at night and started on Celebrex which she states have provided little if any relief of her symptoms. She continues to experience significant pain in both wrists, especially at night, which will awaken her from sleep. She also has increased pain when she tries to perform any sustained or repetitive activities with her right hand. She is unable to drive a car with her right hand as it will go numb. She describes numbness to the long finger of the right hand. She is right-hand dominant. Since seeing Reche Dixon, PA-C, she has undergone EMG studies of both wrists and presents today to review the results of the study with me, as well as to discuss potential treatment options.  Current Outpatient Medications: albuterol 90 mcg/actuation inhaler Inhale 2 inhalations into the lungs every 6 (six) hours as needed for Wheezing 6.7 g 11  atorvastatin (LIPITOR) 20 MG tablet TAKE 1 TABLET BY MOUTH DAILY 90 tablet 1  celecoxib (CELEBREX) 200 MG capsule TAKE 1 CAPSULE (200 MG TOTAL) BY MOUTH ONCE DAILY FOR 30 DAYS 30 capsule 1  clonazePAM (KLONOPIN) 0.5 MG tablet TAKE 1 TABLET BY MOUTH TWICE A DAY AS NEEDED FOR ANXIETY 60 tablet 1  clopidogreL (PLAVIX) 75 mg tablet Take 1 tablet (75 mg total) by mouth once daily 90 tablet 1  ferrous fumarate-vitamin C (VITRON-C) 65 mg iron- 125 mg tablet Take 1 tablet by mouth once daily 30 tablet 3  linaCLOtide (LINZESS) 145 mcg capsule Take 1 capsule (145 mcg total) by mouth once daily 90 capsule 1  lisinopriL (ZESTRIL) 5 MG tablet Take 1 tablet (5 mg total)  by mouth once daily 90 tablet 2  traZODone (DESYREL) 100 MG tablet Take 1 tablet (100 mg total) by mouth at bedtime 90 tablet 3  venlafaxine (EFFEXOR-XR) 150 MG XR capsule Take 1 capsule (150 mg total) by mouth once daily Take with '75mg'$  capsule for a total of '225mg'$  daily. 90 capsule 3  venlafaxine (EFFEXOR-XR) 75 MG XR capsule Take 1 capsule (75 mg total) by mouth once daily Take with '150mg'$  Capsule for a total of '225mg'$  daily. 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 Referred to [place] Quitline for resources. Paperwork for [facility] completed.   Past Surgical History:  CHOLECYSTECTOMY 2000  HYSTERECTOMY 2004  UPPER GASTROINTESTINAL ENDOSCOPY 12/05/2013  (MUS) NO REPEAT @ THIS TIME  AORTIC ENDARTECTOMY 2013  Infrarenal aortic stenosis  CHOLECYSTECTOMY  OOPHORECTOMY Bilateral   Family History:  Colon cancer Maternal Aunt  Ovarian cancer Maternal Aunt  Stomach cancer Paternal Aunt  Ovarian cancer Mother  Alzheimer's disease Father  Stroke Maternal Grandmother   Social History:   Socioeconomic History:  Marital status: Divorced  Tobacco Use  Smoking status: Former  Packs/day: 2.00  Years: 35.00  Pack years: 70.00  Types: Cigarettes  Quit date: 09/05/2020  Years since quitting: 1.6  Smokeless tobacco: Never  Tobacco comments:  quit 02/24/2018  Vaping Use  Vaping Use: Every day  Substance and Sexual Activity  Alcohol use: Never  Drug use: No  Sexual activity: Not Currently  Partners: Male  Birth control/protection: Surgical   Other Topics Concern  Would you please tell us about the people who live in your home, your pets, or anything else important to your social life? No   Review of Systems:  A comprehensive 14 point ROS was performed, reviewed, and the pertinent orthopaedic findings are documented in the HPI.  Physical Exam: Vitals:  05/12/22 1435  BP: 124/86  Weight: 67 kg (147 lb 9.6 oz)  Height: 154.9 cm ('5\' 1"'$ )  PainSc: 0-No pain  PainLoc: Wrist   General/Constitutional: The patient appears to be well-nourished, well-developed, and in no acute distress. Neuro/Psych: Normal mood and affect, oriented to person, place and time. Eyes: Non-icteric. Pupils are equal, round, and reactive to light, and exhibit synchronous movement. ENT: Unremarkable. Lymphatic: No palpable adenopathy. Respiratory: Lungs clear to auscultation, Normal chest excursion, No wheezes, and Non-labored breathing Cardiovascular: Regular rate and rhythm. No murmurs. and No edema, swelling or tenderness, except as noted in detailed exam. Integumentary: No impressive skin lesions present, except as noted in detailed exam. Musculoskeletal: Unremarkable, except as noted in detailed exam.  Right wrist/hand exam: Skin inspection of the right wrist and hand is unremarkable. No swelling, erythema, ecchymosis, abrasions, or other skin abnormalities are identified. She has no tenderness to palpation over the dorsal or palmar aspects of either the wrist or hand. She exhibits near full range of motion of the right wrist without any pain or catching. She is able to actively flex and extend all digits fully without any pain or triggering. She is neurovascularly intact to all digits. She has a markedly positive Phalen's test on the right, but a negative Tinel's over the carpal tunnel.  EMG results:  The results of a recent EMG study of both upper extremities is available for review and have been reviewed by myself. By report, the study demonstrates  evidence of "chronic mild right carpal tunnel syndrome and very mild left carpal tunnel syndrome." Report was reviewed by myself and discussed with the patient. The  Assessment: Carpal tunnel syndrome, right.  Plan: The treatment options were discussed with the patient. In addition, patient educational materials were provided regarding the diagnosis and treatment options. The patient is quite frustrated by her symptoms and functional limitations, especially as they pertain to her right wrist, and is ready to consider more aggressive treatment options. Therefore, I have recommended a surgical procedure, specifically an endoscopic right carpal tunnel release. The procedure was discussed with the patient, as were the potential risks (including bleeding, infection, nerve and/or blood vessel injury, persistent or recurrent pain/paresthesias, weakness of grip, need for further surgery, blood clots, strokes, heart attacks and/or arhythmias, pneumonia, etc.) and benefits. The patient states his/her understanding and wishes to proceed. All of the patient's questions and concerns were answered. She can call any time with further concerns. She will follow up post-surgery, routine.    H&P reviewed and patient re-examined. No changes.

## 2022-05-21 NOTE — Transfer of Care (Signed)
Immediate Anesthesia Transfer of Care Note  Patient: Courtney King  Procedure(s) Performed: CARPAL TUNNEL RELEASE ENDOSCOPIC (Right: Wrist)  Patient Location: PACU  Anesthesia Type:MAC and Regional  Level of Consciousness: drowsy  Airway & Oxygen Therapy: Patient Spontanous Breathing and Patient connected to face mask oxygen  Post-op Assessment: Report given to RN and Post -op Vital signs reviewed and stable  Post vital signs: Reviewed and stable  Last Vitals:  Vitals Value Taken Time  BP 92/54 05/21/22 1141  Temp 35.9 1141  Pulse 58 05/21/22 1143  Resp 10 05/21/22 1143  SpO2 100 % 05/21/22 1143  Vitals shown include unvalidated device data.  Last Pain:  Vitals:   05/21/22 0932  TempSrc: Temporal  PainSc: 0-No pain         Complications: No notable events documented.

## 2022-05-21 NOTE — Anesthesia Preprocedure Evaluation (Signed)
Anesthesia Evaluation  Patient identified by MRN, date of birth, ID band Patient awake    Reviewed: Allergy & Precautions, NPO status , Patient's Chart, lab work & pertinent test results  History of Anesthesia Complications Negative for: history of anesthetic complications  Airway Mallampati: III  TM Distance: <3 FB Neck ROM: full    Dental  (+) Chipped   Pulmonary neg shortness of breath, Current Smoker and Patient abstained from smoking.,    Pulmonary exam normal        Cardiovascular Exercise Tolerance: Good hypertension, (-) angina+ CAD  Normal cardiovascular exam     Neuro/Psych PSYCHIATRIC DISORDERS CVA (right side), Residual Symptoms    GI/Hepatic Neg liver ROS, GERD  Controlled,  Endo/Other  diabetes, Type 2  Renal/GU negative Renal ROS  negative genitourinary   Musculoskeletal   Abdominal   Peds  Hematology negative hematology ROS (+)   Anesthesia Other Findings Past Medical History: No date: Allergy No date: Anemia No date: Anxiety No date: GERD (gastroesophageal reflux disease) 2014: H/O blood clots     Comment:  in abdominal aorta - endarterectomy at [facility] No date: Hyperlipidemia No date: Hypertension No date: Left leg claudication ([place]) No date: Pre-diabetes No date: Stroke [facility])  Past Surgical History: 2004: ABDOMINAL HYSTERECTOMY 2013: AORTIC ENDARTERECETOMY 1999: CHOLECYSTECTOMY 04/03/2017: COLONOSCOPY WITH PROPOFOL; N/A     Comment:  Procedure: COLONOSCOPY WITH PROPOFOL;  Surgeon: Jonathon Bellows, MD;  Location[facility] ENDOSCOPY;  Service:               Endoscopy;  Laterality: N/A; 04/03/2017: ESOPHAGOGASTRODUODENOSCOPY (EGD) WITH PROPOFOL; N/A     Comment:  Procedure: ESOPHAGOGASTRODUODENOSCOPY (EGD) WITH               PROPOFOL;  Surgeon: Jonathon Bellows, MD;  Location: [facility]               ENDOSCOPY;  Service: Endoscopy;  Laterality: N/A; 04/30/2017: EUS; N/A     Comment:  Procedure:  FULL UPPER ENDOSCOPIC ULTRASOUND (EUS)               RADIAL;  Surgeon: Jola Schmidt, MD;  Location: [facility]               ENDOSCOPY;  Service: Endoscopy;  Laterality: N/A; No date: FOOT SURGERY; Left     Comment:  plantar fasciitis 2019: LAPAROSCOPIC NISSEN FUNDOPLICATION     Comment:  done in [place] to eliminate acid reflux 06/29/2018: LAPAROSCOPIC SIGMOID COLECTOMY; N/A     Comment:  Procedure: LAPAROSCOPIC SIGMOID COLECTOMY;  Surgeon:               Jules Husbands, MD;  Location: [facility] ORS;  Service:               General;  Laterality: N/A; 2004: OOPHORECTOMY; Bilateral  BMI    Body Mass Index: 27.59 kg/m      Reproductive/Obstetrics negative OB ROS                             Anesthesia Physical Anesthesia Plan  ASA: 3  Anesthesia Plan: General and Bier Block   Post-op Pain Management:    Induction: Intravenous  PONV Risk Score and Plan: Propofol infusion and TIVA  Airway Management Planned: Natural Airway and Nasal Cannula  Additional Equipment:   Intra-op Plan:   Post-operative Plan:   Informed Consent: I  have reviewed the patients History and Physical, chart, labs and discussed the procedure including the risks, benefits and alternatives for the proposed anesthesia with the patient or authorized representative who has indicated his/her understanding and acceptance.     Dental Advisory Given  Plan Discussed with: Anesthesiologist, CRNA and Surgeon  Anesthesia Plan Comments: (Patient consented for risks of anesthesia including but not limited to:  - adverse reactions to medications - risk of airway placement if required - damage to eyes, teeth, lips or other oral mucosa - nerve damage due to positioning  - sore throat or hoarseness - Damage to heart, brain, nerves, lungs, other parts of body or loss of life  Patient voiced understanding.)        Anesthesia Quick Evaluation

## 2022-05-21 NOTE — Discharge Instructions (Addendum)
Orthopedic discharge instructions: Keep dressing dry and intact. Keep hand elevated above heart level. May shower after dressing removed on postop day 4 (Sunday). Cover sutures with Band-Aids after drying off. Apply ice to affected area frequently. Take ibuprofen 600-800 mg TID with meals for 3-5 days, then as necessary. Take ES Tylenol or pain medication as prescribed when needed.  May resume Plavix tomorrow morning. Return for follow-up in 10-14 days or as scheduled.AMBULATORY SURGERY  DISCHARGE INSTRUCTIONS   The drugs that you were given will stay in your system until tomorrow so for the next 24 hours you should not:  Drive an automobile Make any legal decisions Drink any alcoholic beverage   You may resume regular meals tomorrow.  Today it is better to start with liquids and gradually work up to solid foods.  You may eat anything you prefer, but it is better to start with liquids, then soup and crackers, and gradually work up to solid foods.   Please notify your doctor immediately if you have any unusual bleeding, trouble breathing, redness and pain at the surgery site, drainage, fever, or pain not relieved by medication.    Your post-operative visit with Dr.                                       is: Date:                        Time:    Please call to schedule your post-operative visit.  Additional Instructions:

## 2022-05-21 NOTE — Progress Notes (Signed)
Patient awake/alert x4. Bier block to right wrist area: surgery site: NO pain. Tolerated cola and crackers without event.

## 2022-05-21 NOTE — Op Note (Signed)
05/21/2022  11:41 AM  Patient:   Courtney King  Pre-Op Diagnosis:   Right carpal tunnel syndrome.  Post-Op Diagnosis:   Same.  Procedure:   Endoscopic right carpal tunnel release.  Surgeon:   Pascal Lux, MD  Anesthesia:   Bier block with IV sedation  Findings:   As above.  Complications:   None  EBL:   1 cc  Fluids:   250 cc crystalloid  TT:   30 minutes at 250 mmHg  Drains:   None  Closure:   4-0 Prolene interrupted sutures  Brief Clinical Note:   The patient is a 56 year old female with a history of pain and paresthesias to the right hand. Her symptoms have progressed despite medications, activity modification, splinting, etc. Her history and examination are consistent with carpal tunnel syndrome, confirmed by EMG. The patient presents at this time for an endoscopic right carpal tunnel release.   Procedure:   The patient was brought into the operating room and lain in the supine position. After adequate IV sedation was achieved, a timeout was performed to verify the appropriate surgical site before a Bier block was placed by the anesthesiologist after inflating the tourniquet to 250 mmHg. The right hand and upper extremity were prepped with ChloraPrep solution before being draped sterilely. Preoperative antibiotics were administered. A second timeout was performed to verify the appropriate surgical site.   An approximately 1.5-2 cm incision was made over the volar wrist flexion crease, centered over the palmaris longus tendon. The incision was carried down through the subcutaneous tissues with care taken to identify and protect any neurovascular structures. The distal forearm fascia was penetrated just proximal to the transverse carpal ligament. The soft tissues were released off the superficial and deep surfaces of the distal forearm fascia and this was released proximally for 3-4 cm under direct visualization.  Attention was directed distally. The Soil scientist was  passed beneath the transverse carpal ligament along the ulnar aspect of the carpal tunnel and used to release any adhesions as well as to remove any adherent synovial tissue before first the smaller then the larger of the two dilators were passed beneath the transverse carpal ligament along the ulnar margin of the carpal tunnel. The slotted cannula was introduced and the endoscope was placed into the slotted cannula and the undersurface of the transverse carpal ligament visualized. The distal margin of the transverse carpal ligament was marked by placing a 25-gauge needle percutaneously at Conrad cardinal point so that it entered the distal portion of the slotted cannula. Under endoscopic visualization, the transverse carpal ligament was released from proximal to distal using the end-cutting blade. A second pass was performed to ensure complete release of the ligament. The adequacy of release was verified both endoscopically and by palpation using the freer elevator.  The wound was irrigated thoroughly with sterile saline solution before being closed using 4-0 Prolene interrupted sutures. A total of 10 cc of 0.5% plain Sensorcaine was injected in and around the incision before a sterile bulky dressing was applied to the wound. The patient was placed into a volar wrist splint before being awakened, extubated, and returned to the recovery room in satisfactory condition after tolerating the procedure well.

## 2022-05-25 ENCOUNTER — Encounter: Payer: Self-pay | Admitting: Surgery

## 2022-05-25 NOTE — Anesthesia Postprocedure Evaluation (Signed)
Anesthesia Post Note  Patient: TAQUITA DEMBY  Procedure(s) Performed: CARPAL TUNNEL RELEASE ENDOSCOPIC (Right: Wrist)  Patient location during evaluation: PACU Anesthesia Type: MAC and Regional Level of consciousness: awake and alert Pain management: pain level controlled Vital Signs Assessment: post-procedure vital signs reviewed and stable Respiratory status: spontaneous breathing, nonlabored ventilation, respiratory function stable and patient connected to nasal cannula oxygen Cardiovascular status: blood pressure returned to baseline and stable Postop Assessment: no apparent nausea or vomiting Anesthetic complications: no   No notable events documented.   Last Vitals:  Vitals:   05/21/22 1200 05/21/22 1223  BP: 140/63 (!) 151/45  Pulse: 75 60  Resp: 16 16  Temp: (!) 36.1 C (!) 36.1 C  SpO2: 97% 95%    Last Pain:  Vitals:   05/21/22 1223  TempSrc: Temporal  PainSc: 0-No pain                 Martha Clan

## 2022-05-29 ENCOUNTER — Ambulatory Visit
Admission: RE | Admit: 2022-05-29 | Discharge: 2022-05-29 | Disposition: A | Discharge status: Home / Self Care | Payer: Medicare HMO | Source: Ambulatory Visit | Attending: Family Medicine | Admitting: Family Medicine

## 2022-05-29 DIAGNOSIS — Z1231 Encounter for screening mammogram for malignant neoplasm of breast: Secondary | ICD-10-CM | POA: Diagnosis present

## 2022-08-05 ENCOUNTER — Encounter: Payer: Self-pay | Admitting: Emergency Medicine

## 2022-08-05 ENCOUNTER — Other Ambulatory Visit: Payer: Self-pay

## 2022-08-05 ENCOUNTER — Ambulatory Visit
Admission: EM | Admit: 2022-08-05 | Discharge: 2022-08-05 | Disposition: A | Discharge status: Home / Self Care | Payer: Medicare HMO | Attending: Family Medicine | Admitting: Family Medicine

## 2022-08-05 DIAGNOSIS — Z1152 Encounter for screening for COVID-19: Secondary | ICD-10-CM | POA: Diagnosis not present

## 2022-08-05 DIAGNOSIS — J069 Acute upper respiratory infection, unspecified: Secondary | ICD-10-CM | POA: Insufficient documentation

## 2022-08-05 LAB — POCT RAPID STREP A (OFFICE): Rapid Strep A Screen: NEGATIVE

## 2022-08-05 LAB — RESP PANEL BY RT-PCR (FLU A&B, COVID) ARPGX2
Influenza A by PCR: NEGATIVE
Influenza B by PCR: NEGATIVE
SARS Coronavirus 2 by RT PCR: POSITIVE — AB

## 2022-08-05 MED ORDER — PROMETHAZINE-DM 6.25-15 MG/5ML PO SYRP: 5.0000 mL | ORAL_SOLUTION | Freq: Four times a day (QID) | ORAL | 0 refills | Status: AC | PRN

## 2022-08-05 MED ORDER — PROMETHAZINE-DM 6.25-15 MG/5ML PO SYRP: 5.0000 mL | ORAL_SOLUTION | Freq: Four times a day (QID) | ORAL | 0 refills | Status: DC | PRN

## 2022-08-05 MED ORDER — MOLNUPIRAVIR EUA 200MG CAPSULE: 4.0000 | ORAL_CAPSULE | Freq: Two times a day (BID) | ORAL | 0 refills | Status: AC

## 2022-08-05 MED ORDER — MOLNUPIRAVIR EUA 200MG CAPSULE: 4.0000 | ORAL_CAPSULE | Freq: Two times a day (BID) | ORAL | 0 refills | Status: DC

## 2022-08-05 NOTE — ED Triage Notes (Signed)
Pt reports fever, body aches,headache since Sunday morning and reports woke up this am with sore throat. Pt reports has tried otc theraflu with no change in symptoms.

## 2022-08-05 NOTE — ED Provider Notes (Signed)
[facility]    CSN: [id number] Arrival date & time: 08/05/22  0737      History   Chief Complaint Chief Complaint  Patient presents with   Fever    HPI Courtney King is a 56 y.o. female.   Presenting today with 1 day history of fever, chills, body aches, headache, sore throat, congestion, cough.  Denies chest pain, shortness of breath, abdominal pain, nausea vomiting or diarrhea.  Took some TheraFlu this morning with mild temporary relief of symptoms.  No known sick contacts recently.  No known history of chronic pulmonary issues.    Past Medical History:  Diagnosis Date   Allergy    Anemia    Anxiety    GERD (gastroesophageal reflux disease)    H/O blood clots 2014   in abdominal aorta - endarterectomy at [facility]   Hyperlipidemia    Hypertension    Left leg claudication ([place])    Pre-diabetes    Stroke [facility])     Patient Active Problem List   Diagnosis Date Noted   Stroke ([place]) 01/28/2022   Diverticulitis of colon 06/21/2018   Sigmoid diverticulitis    Coronary artery disease 03/09/2018   Diabetes ([place]) 03/09/2018   Anxiety, generalized 10/20/2016   H/O carotid endarterectomy 10/20/2016   Hyperlipidemia, mixed 10/20/2016   Pruritic erythematous rash 10/20/2016   Dependence on nicotine from other tobacco product 07/11/2016   Tobacco abuse 07/11/2016   Family history of colon cancer 12/07/2015   Family history of ovarian cancer 12/07/2015   Prediabetes 12/04/2015   Hx of endarterectomy 11/02/2015   Essential hypertension 11/02/2015   Hypertriglyceridemia 09/03/2015   Adiposity 09/03/2015   BMI 29.0-29.9,adult 07/17/2015   Anxiety 07/17/2015   GERD (gastroesophageal reflux disease) 07/17/2015   Hot flashes, menopausal 07/17/2015    Past Surgical History:  Procedure Laterality Date   ABDOMINAL HYSTERECTOMY  2004   AORTIC ENDARTERECETOMY  2013   CARPAL TUNNEL RELEASE Right 05/21/2022   Procedure: CARPAL TUNNEL RELEASE ENDOSCOPIC;  Surgeon:  Corky Mull, MD;  Location: [facility] ORS;  Service: Orthopedics;  Laterality: Right;   CHOLECYSTECTOMY  1999   COLONOSCOPY WITH PROPOFOL N/A 04/03/2017   Procedure: COLONOSCOPY WITH PROPOFOL;  Surgeon: Jonathon Bellows, MD;  Location[facility] ENDOSCOPY;  Service: Endoscopy;  Laterality: N/A;   ESOPHAGOGASTRODUODENOSCOPY (EGD) WITH PROPOFOL N/A 04/03/2017   Procedure: ESOPHAGOGASTRODUODENOSCOPY (EGD) WITH PROPOFOL;  Surgeon: Jonathon Bellows, MD;  Location: [facility] ENDOSCOPY;  Service: Endoscopy;  Laterality: N/A;   EUS N/A 04/30/2017   Procedure: FULL UPPER ENDOSCOPIC ULTRASOUND (EUS) RADIAL;  Surgeon: Jola Schmidt, MD;  Location: [facility] ENDOSCOPY;  Service: Endoscopy;  Laterality: N/A;   FOOT SURGERY Left    plantar fasciitis   LAPAROSCOPIC NISSEN FUNDOPLICATION  1062   done in [place] to eliminate acid reflux   LAPAROSCOPIC SIGMOID COLECTOMY N/A 06/29/2018   Procedure: LAPAROSCOPIC SIGMOID COLECTOMY;  Surgeon: Jules Husbands, MD;  Location: [facility] ORS;  Service: General;  Laterality: N/A;   OOPHORECTOMY Bilateral 2004    OB History   No obstetric history on file.      Home Medications    Prior to Admission medications   Medication Sig Start Date End Date Taking? Authorizing Provider  atorvastatin (LIPITOR) 20 MG tablet Take 1 tablet (20 mg total) by mouth daily. 07/12/18 05/19/22  Carrie Mew, MD  clonazePAM (KLONOPIN) 0.5 MG tablet Take 0.5 mg by mouth at bedtime as needed.     [provider]  clopidogrel (PLAVIX) 75 MG tablet Take  75 mg by mouth daily.    [provider]  ferrous sulfate 325 (65 FE) MG EC tablet Take 325 mg by mouth 3 (three) times daily with meals.    [provider]  linaclotide Rolan Lipa) 145 MCG CAPS capsule Take 1 capsule (145 mcg total) by mouth daily before breakfast. 12/15/17 06/21/18  Jonathon Bellows, MD  lisinopril (PRINIVIL,ZESTRIL) 10 MG tablet Take 10 mg by mouth daily.    [provider]  molnupiravir EUA (LAGEVRIO) 200 mg CAPS capsule  Take 4 capsules (800 mg total) by mouth 2 (two) times daily for 5 days. 08/05/22 08/10/22  Volney American, PA-C  Probiotic Product (PROBIOTIC-10 PO) Take by mouth.    [provider]  promethazine-dextromethorphan (PROMETHAZINE-DM) 6.25-15 MG/5ML syrup Take 5 mLs by mouth 4 (four) times daily as needed. 08/05/22   Volney American, PA-C  traMADol (ULTRAM) 50 MG tablet Take 1 tablet (50 mg total) by mouth every 6 (six) hours as needed for moderate pain. 05/21/22   Poggi, Marshall Cork, MD  traZODone (DESYREL) 50 MG tablet Take 50 mg by mouth at bedtime.    [provider]  venlafaxine (EFFEXOR) 100 MG tablet Take 200 mg by mouth daily.    [provider]  venlafaxine (EFFEXOR) 75 MG tablet Take 75 mg by mouth 2 (two) times daily.    [provider]    Family History Family History  Problem Relation Age of Onset   Ovarian cancer Mother    Ovarian cancer Maternal Aunt    Stroke Maternal Grandmother    Stroke Paternal Grandmother    Breast cancer Neg Hx     Social History Social History   Tobacco Use   Smoking status: Every Day    Packs/day: 1.00    Years: 20.00    Total pack years: 20.00    Types: Cigarettes   Smokeless tobacco: Current  Vaping Use   Vaping Use: Every day   Substances: Nicotine, Flavoring  Substance Use Topics   Alcohol use: No    Alcohol/week: 0.0 standard drinks of alcohol   Drug use: No     Allergies   Codeine   Review of Systems Review of Systems Per HPI  Physical Exam Triage Vital Signs ED Triage Vitals [08/05/22 0836]  Enc Vitals Group     BP 129/75     Pulse Rate 86     Resp 20     Temp 99.2 F (37.3 C)     Temp Source Oral     SpO2 92 %     Weight      Height      Head Circumference      Peak Flow      Pain Score 8     Pain Loc      Pain Edu?      Excl. in [place]?    No data found.  Updated Vital Signs BP 129/75 (BP Location: Right Arm)   Pulse 86   Temp 99.2 F (37.3 C) (Oral)   Resp 20    SpO2 92%   Visual Acuity Right Eye Distance:   Left Eye Distance:   Bilateral Distance:    Right Eye Near:   Left Eye Near:    Bilateral Near:     Physical Exam Vitals and nursing note reviewed.  Constitutional:      Appearance: Normal appearance.  HENT:     Head: Atraumatic.     Right Ear: Tympanic membrane and external ear  normal.     Left Ear: Tympanic membrane and external ear normal.     Nose: Rhinorrhea present.     Mouth/Throat:     Mouth: Mucous membranes are moist.     Pharynx: Posterior oropharyngeal erythema present.  Eyes:     Extraocular Movements: Extraocular movements intact.     Conjunctiva/sclera: Conjunctivae normal.  Cardiovascular:     Rate and Rhythm: Normal rate and regular rhythm.     Heart sounds: Normal heart sounds.  Pulmonary:     Effort: Pulmonary effort is normal.     Breath sounds: Normal breath sounds. No wheezing or rales.  Musculoskeletal:        General: Normal range of motion.     Cervical back: Normal range of motion and neck supple.  Skin:    General: Skin is warm and dry.  Neurological:     Mental Status: She is alert and oriented to person, place, and time.  Psychiatric:        Mood and Affect: Mood normal.        Thought Content: Thought content normal.      UC Treatments / Results  Labs (all labs ordered are listed, but only abnormal results are displayed) Labs Reviewed  RESP PANEL BY RT-PCR (FLU A&B, COVID) [id number]  POCT RAPID STREP A (OFFICE)    EKG   Radiology No results found.  Procedures Procedures (including critical care time)  Medications Ordered in UC Medications - No data to display  Initial Impression / Assessment and Plan / UC Course  I have reviewed the triage vital signs and the nursing notes.  Pertinent labs & imaging results that were available during my care of the patient were reviewed by me and considered in my medical decision making (see chart for details).     Vital signs within  normal limits today, exam reassuring and in no acute distress.  Rapid strep negative, respiratory panel pending.  Strong suspicion for COVID-19 based on symptoms, we will start molnupiravir while awaiting results and Phenergan DM, supportive over-the-counter medications and home care.  Return for worsening symptoms.  Final Clinical Impressions(s) / UC Diagnoses   Final diagnoses:  Encounter for screening for COVID-19  Viral URI with cough   Discharge Instructions   None    ED Prescriptions     Medication Sig Dispense Auth. Provider   molnupiravir EUA (LAGEVRIO) 200 mg CAPS capsule  (Status: Discontinued) Take 4 capsules (800 mg total) by mouth 2 (two) times daily for 5 days. 40 capsule Volney American, [place]   promethazine-dextromethorphan (PROMETHAZINE-DM) 6.25-15 MG/5ML syrup  (Status: Discontinued) Take 5 mLs by mouth 4 (four) times daily as needed. 100 mL Volney American, PA-C   molnupiravir EUA (LAGEVRIO) 200 mg CAPS capsule Take 4 capsules (800 mg total) by mouth 2 (two) times daily for 5 days. 40 capsule Volney American, [place]   promethazine-dextromethorphan (PROMETHAZINE-DM) 6.25-15 MG/5ML syrup Take 5 mLs by mouth 4 (four) times daily as needed. 100 mL Volney American, [place]      PDMP not reviewed this encounter.   Volney American, [place] 08/05/22 [phone]

## 2022-09-15 ENCOUNTER — Encounter (INDEPENDENT_AMBULATORY_CARE_PROVIDER_SITE_OTHER): Payer: Self-pay

## 2024-09-15 ENCOUNTER — Ambulatory Visit

## 2024-09-15 ENCOUNTER — Other Ambulatory Visit: Payer: Self-pay | Admitting: Family Medicine

## 2024-09-15 DIAGNOSIS — J40 Bronchitis, not specified as acute or chronic: Secondary | ICD-10-CM
